# Patient Record
Sex: Male | Born: 1970
Health system: Southern US, Community
[De-identification: ages and names within clinical notes are randomized; demographics above are authoritative.]

## PROBLEM LIST (undated history)

## (undated) DIAGNOSIS — Z87442 Personal history of urinary calculi: Secondary | ICD-10-CM

## (undated) DIAGNOSIS — R251 Tremor, unspecified: Secondary | ICD-10-CM

## (undated) DIAGNOSIS — F419 Anxiety disorder, unspecified: Secondary | ICD-10-CM

## (undated) DIAGNOSIS — N200 Calculus of kidney: Secondary | ICD-10-CM

## (undated) DIAGNOSIS — I10 Essential (primary) hypertension: Secondary | ICD-10-CM

## (undated) DIAGNOSIS — M109 Gout, unspecified: Secondary | ICD-10-CM

## (undated) DIAGNOSIS — M1990 Unspecified osteoarthritis, unspecified site: Secondary | ICD-10-CM

## (undated) DIAGNOSIS — E669 Obesity, unspecified: Secondary | ICD-10-CM

## (undated) DIAGNOSIS — G43909 Migraine, unspecified, not intractable, without status migrainosus: Secondary | ICD-10-CM

## (undated) DIAGNOSIS — F32A Depression, unspecified: Secondary | ICD-10-CM

## (undated) DIAGNOSIS — Z9989 Dependence on other enabling machines and devices: Secondary | ICD-10-CM

## (undated) DIAGNOSIS — G629 Polyneuropathy, unspecified: Secondary | ICD-10-CM

## (undated) DIAGNOSIS — G4733 Obstructive sleep apnea (adult) (pediatric): Secondary | ICD-10-CM

## (undated) HISTORY — PX: OTHER SURGICAL HISTORY: SHX169

## (undated) HISTORY — DX: Gout, unspecified: M10.9

## (undated) HISTORY — DX: Anxiety disorder, unspecified: F41.9

## (undated) HISTORY — DX: Obstructive sleep apnea (adult) (pediatric): G47.33

## (undated) HISTORY — PX: TOOTH EXTRACTION: SUR596

## (undated) HISTORY — DX: Obstructive sleep apnea (adult) (pediatric): Z99.89

---

## 2000-04-18 ENCOUNTER — Encounter: Payer: Self-pay | Admitting: Emergency Medicine

## 2000-04-18 ENCOUNTER — Emergency Department (HOSPITAL_COMMUNITY): Admission: EM | Admit: 2000-04-18 | Discharge: 2000-04-18 | Payer: Self-pay

## 2000-12-24 ENCOUNTER — Emergency Department (HOSPITAL_COMMUNITY): Admission: EM | Admit: 2000-12-24 | Discharge: 2000-12-24 | Payer: Self-pay | Admitting: *Deleted

## 2000-12-24 ENCOUNTER — Encounter: Payer: Self-pay | Admitting: Emergency Medicine

## 2001-03-06 ENCOUNTER — Encounter: Payer: Self-pay | Admitting: Emergency Medicine

## 2001-03-06 ENCOUNTER — Emergency Department (HOSPITAL_COMMUNITY): Admission: EM | Admit: 2001-03-06 | Discharge: 2001-03-06 | Payer: Self-pay | Admitting: *Deleted

## 2005-09-06 ENCOUNTER — Encounter: Payer: Self-pay | Admitting: Family Medicine

## 2005-09-06 LAB — CONVERTED CEMR LAB
Cholesterol: 163 mg/dL
HDL: 29 mg/dL

## 2008-06-30 ENCOUNTER — Emergency Department (HOSPITAL_COMMUNITY): Admission: EM | Admit: 2008-06-30 | Discharge: 2008-06-30 | Payer: Self-pay | Admitting: Emergency Medicine

## 2008-07-21 ENCOUNTER — Ambulatory Visit: Payer: Self-pay | Admitting: Family Medicine

## 2008-08-03 ENCOUNTER — Ambulatory Visit: Payer: Self-pay | Admitting: Family Medicine

## 2008-08-03 ENCOUNTER — Encounter: Payer: Self-pay | Admitting: Family Medicine

## 2008-08-03 DIAGNOSIS — M109 Gout, unspecified: Secondary | ICD-10-CM | POA: Insufficient documentation

## 2008-08-03 DIAGNOSIS — R609 Edema, unspecified: Secondary | ICD-10-CM | POA: Insufficient documentation

## 2008-08-03 DIAGNOSIS — I10 Essential (primary) hypertension: Secondary | ICD-10-CM | POA: Insufficient documentation

## 2008-08-03 LAB — CONVERTED CEMR LAB
ALT: 30 units/L (ref 0–53)
AST: 20 units/L (ref 0–37)
Albumin: 4.1 g/dL (ref 3.5–5.2)
Alkaline Phosphatase: 74 units/L (ref 39–117)
BUN: 13 mg/dL (ref 6–23)
CO2: 22 meq/L (ref 19–32)
Calcium: 9.8 mg/dL (ref 8.4–10.5)
Chloride: 103 meq/L (ref 96–112)
Creatinine, Ser: 0.93 mg/dL (ref 0.40–1.50)
Glucose, Bld: 87 mg/dL (ref 70–99)
HCT: 44.1 % (ref 39.0–52.0)
Hemoglobin: 14.4 g/dL (ref 13.0–17.0)
MCHC: 32.7 g/dL (ref 30.0–36.0)
MCV: 91.7 fL (ref 78.0–100.0)
Platelets: 337 10*3/uL (ref 150–400)
Potassium: 4.3 meq/L (ref 3.5–5.3)
RBC: 4.81 M/uL (ref 4.22–5.81)
RDW: 14.7 % (ref 11.5–15.5)
Sodium: 142 meq/L (ref 135–145)
Total Bilirubin: 0.3 mg/dL (ref 0.3–1.2)
Total Protein: 7.2 g/dL (ref 6.0–8.3)
Uric Acid, Serum: 7.9 mg/dL — ABNORMAL HIGH (ref 4.0–7.8)
WBC: 10.5 10*3/uL (ref 4.0–10.5)

## 2008-08-04 ENCOUNTER — Encounter: Payer: Self-pay | Admitting: Family Medicine

## 2008-10-19 ENCOUNTER — Ambulatory Visit: Payer: Self-pay | Admitting: Family Medicine

## 2008-10-19 DIAGNOSIS — F411 Generalized anxiety disorder: Secondary | ICD-10-CM | POA: Insufficient documentation

## 2009-05-24 ENCOUNTER — Ambulatory Visit: Payer: Self-pay | Admitting: Family Medicine

## 2009-09-23 ENCOUNTER — Ambulatory Visit: Payer: Self-pay | Admitting: Family Medicine

## 2009-09-23 LAB — CONVERTED CEMR LAB
Albumin: 4.5 g/dL (ref 3.5–5.2)
BUN: 20 mg/dL (ref 6–23)
Calcium: 9.6 mg/dL (ref 8.4–10.5)
Chloride: 102 meq/L (ref 96–112)
Cholesterol: 198 mg/dL (ref 0–200)
Glucose, Bld: 94 mg/dL (ref 70–99)
HDL: 35 mg/dL — ABNORMAL LOW (ref >39)
Potassium: 4.2 meq/L (ref 3.5–5.3)
Triglycerides: 231 mg/dL — ABNORMAL HIGH (ref <150)

## 2009-09-24 ENCOUNTER — Encounter: Payer: Self-pay | Admitting: Family Medicine

## 2010-01-19 ENCOUNTER — Encounter: Payer: Self-pay | Admitting: Family Medicine

## 2010-01-19 ENCOUNTER — Ambulatory Visit: Payer: Self-pay | Admitting: Family Medicine

## 2010-01-19 DIAGNOSIS — M25569 Pain in unspecified knee: Secondary | ICD-10-CM | POA: Insufficient documentation

## 2010-01-19 DIAGNOSIS — M722 Plantar fascial fibromatosis: Secondary | ICD-10-CM | POA: Insufficient documentation

## 2010-04-06 ENCOUNTER — Ambulatory Visit: Payer: Self-pay | Admitting: Family Medicine

## 2010-04-06 DIAGNOSIS — K4090 Unilateral inguinal hernia, without obstruction or gangrene, not specified as recurrent: Secondary | ICD-10-CM | POA: Insufficient documentation

## 2010-04-06 DIAGNOSIS — K439 Ventral hernia without obstruction or gangrene: Secondary | ICD-10-CM | POA: Insufficient documentation

## 2010-04-06 DIAGNOSIS — M549 Dorsalgia, unspecified: Secondary | ICD-10-CM | POA: Insufficient documentation

## 2010-04-18 ENCOUNTER — Emergency Department (HOSPITAL_COMMUNITY)
Admission: EM | Admit: 2010-04-18 | Discharge: 2010-04-18 | Payer: Self-pay | Source: Home / Self Care | Admitting: Emergency Medicine

## 2010-07-19 NOTE — Assessment & Plan Note (Signed)
Summary: leg edema/pain, plantar fasciitis, left knee pain   Vital Signs:  Patient profile:   40 year old male Weight:      375.8 pounds Temp:     98.1 degrees F oral Pulse rate:   88 / minute Pulse rhythm:   regular BP sitting:   113 / 77  (right arm) Cuff size:   large  Vitals Entered By: Loralee Pacas CMA (January 19, 2010 11:19 AM) CC: lower leg edema, foot pain, left knee pain Comments pt is c/o bilateral leg pain more so in left leg around his knee   Primary Care Provider:  Pearlean Brownie MD  CC:  lower leg edema, foot pain, and left knee pain.  History of Present Illness: Bilateral lower leg edema, Pt has some bilateral lower leg edema and some erythema on the left leg. he says this has been present for quite a while and that he keeps his legs propped up at night to keep the swelling down. He says that he has been having some sharp pain in his calves bilaterally at night. He gets it to improve by stretching the legs and says that the pain goes away. Pt has not had any shortness of breath that is out of his usual. No history of clots but is concerned about blood clots.   Foot pain: Pt is having bilateral foot pain. At first he described it as ankle pain but on further questioning it is the bottom of his feet. He is concerned about "heal spurs" because his mom had some. He says the pain is worse at the end of the day but is also sometimes present when he first puts his feet down in the morning. Hhe has tried Aleve, Iburpofen, elevation and ankle braces to get his feet to feel better.   Left knee pain: Pt developed some left knee pain this morning. It is onthe medial aspect of the knee. He does not remember any injury but says that he does bend down on his knees when dressing his little son in the morning. He took aleve this morning   Current Medications (verified): 1)  Lisinopril-Hydrochlorothiazide 20-25 Mg Tabs (Lisinopril-Hydrochlorothiazide) .... Take One Table By Mouth  Once Daily 2)  Allopurinol 300 Mg Tabs (Allopurinol) .... Take 1 Tablet By Mouth Once Daily 3)  Fluoxetine Hcl 40 Mg Caps (Fluoxetine Hcl) .Marland Kitchen.. 1 Daily 4)  Aspirin Adult Low Strength 81 Mg Tbec (Aspirin) .... Take 1 Tablet By Mouth Daily 5)  Ester-C 1000-50 Mg Tabs (Bioflavonoid Products) .Marland Kitchen.. 1 Tablet By Mouth Daily 6)  Calcium 600 1500 Mg Tabs (Calcium Carbonate) .... Take 1,200 Mg By Mouth Daily 7)  Vitamin D 1000 Unit  Tabs (Cholecalciferol) .... Take One Tablet By Mouth Daily 8)  Multivitamins   Tabs (Multiple Vitamin) .... Take One Tablet By Mouth Daily 9)  Zinc 50 Mg Tabs (Zinc) .... Take 2 Tablets By Mouth Daily 10)  Prilosec Otc 20 Mg Tbec (Omeprazole Magnesium) .... As Needed 11)  Claritin 10 Mg Tabs (Loratadine) .... As Needed 12)  Furosemide 20 Mg Tabs (Furosemide) .... Take 1 Pill Every Morning To Get Rid of Some Fluid On Your Legs  Allergies (verified): 1)  ! Sulfa  Review of Systems        vitals reviewed and pertinent negatives and positives seen in HPI   Physical Exam  General:  Obese, in no acute distress; alert,appropriate and cooperative throughout examination Head:  Normocephalic and atraumatic without obvious abnormalities. No apparent alopecia  or balding. Msk:  tenderness over MCL on left knee, no joint swelling noted but pt is obese so difficult to assess. Pt does not appear to have any effusion. no warmth.    Impression & Recommendations:  Problem # 1:  LEG EDEMA (ICD-782.3) Assessment New Pt has lower leg edema that appears to be a chronic issue. He has some hemosiderin deposits developing in his lower legs. He is quite obese and would benifit from weight loss. Will try a low dose Lasix to treat his edema.   His updated medication list for this problem includes:    Lisinopril-hydrochlorothiazide 20-25 Mg Tabs (Lisinopril-hydrochlorothiazide) .Marland Kitchen... Take one table by mouth once daily    Furosemide 20 Mg Tabs (Furosemide) .Marland Kitchen... Take 1 pill every morning to  get rid of some fluid on your legs  Orders: [facility]imer- [facility] ([address]40-98119) [facility]- Est  Level 4 ([address]782)  Problem # 2:  PLANTAR FASCIITIS, BILATERAL (ICD-728.71) Assessment: New Pt appears to be having some early signs of plantar fasciitis. He says it is worse in the evenings and first thing in the morning. He says it is also pain in his heels. Advised getting heel cups, stretching and rolling his feet over waterbottle with ice in it. May end up needing orthotics in the future. Advised to wear best supportive shoes.   Orders: [facility]- Est  Level 4 (95621)  Problem # 3:  KNEE PAIN, LEFT, ACUTE (ICD-719.46) Assessment: New Pt has had 1 day of left knee pain after bending down to dress his son. Could be some meniscal strain/tear, could be medial collateral ligament strain. Tender over medial aspect of knee joint. Advised ice, rest, Aleve.  His updated medication list for this problem includes:    Aspirin Adult Low Strength 81 Mg Tbec (Aspirin) .Marland Kitchen... Take 1 tablet by mouth daily  Orders: [facility]- Est  Level 4 ([address]65)  Complete Medication List: 1)  Lisinopril-hydrochlorothiazide 20-25 Mg Tabs (Lisinopril-hydrochlorothiazide) .... Take one table by mouth once daily 2)  Allopurinol 300 Mg Tabs (Allopurinol) .... Take 1 tablet by mouth once daily 3)  Fluoxetine Hcl 40 Mg Caps (Fluoxetine hcl) .Marland Kitchen.. 1 daily 4)  Aspirin Adult Low Strength 81 Mg Tbec (Aspirin) .... Take 1 tablet by mouth daily 5)  Ester-c 1000-50 Mg Tabs (Bioflavonoid products) .Marland Kitchen.. 1 tablet by mouth daily 6)  Calcium 600 1500 Mg Tabs (Calcium carbonate) .... Take 1,200 mg by mouth daily 7)  Vitamin D 1000 Unit Tabs (Cholecalciferol) .... Take one tablet by mouth daily 8)  Multivitamins Tabs (Multiple vitamin) .... Take one tablet by mouth daily 9)  Zinc 50 Mg Tabs (Zinc) .... Take 2 tablets by mouth daily 10)  Prilosec Otc 20 Mg Tbec (Omeprazole magnesium) .... As needed 11)  Claritin 10 Mg Tabs (Loratadine) .... As needed 12)  Furosemide 20  Mg Tabs (Furosemide) .... Take 1 pill every morning to get rid of some fluid on your legs  Patient Instructions: 1)  Keep doing Aleve every 8 hours for 2 weeks. Take with food.  2)  Ice the knee 3 times a day at least: 15 minutes at a time.  3)  Get heal cups for your best supportive shoes and wear as much as possible.  4)  If you have a plastic bottle, fill with water, freeze and roll on feet at night.  You can also do stretches for the feet.  5)  I will call you with d-dimer results.  6)  We started a fluid pill today. Take it daily  unless you start feeling lightheaded. Then take every other day. If still feeling lightheaded stop it and call our office.  Prescriptions: FUROSEMIDE 20 MG TABS (FUROSEMIDE) take 1 pill every morning to get rid of some fluid on your legs  #90 x 1   Entered and Authorized by:   Jamie Brookes MD   Signed by:   Jamie Brookes MD on 01/19/2010   Method used:   Electronically to        [facility] (retail)       [address]       [place], [place]  [address]       Ph: [phone] or [phone]       Fax: [phone]   RxID:   [id number]

## 2010-07-19 NOTE — Assessment & Plan Note (Signed)
Summary: patient think he may have ?hernia/bmc   Vital Signs:  Patient profile:   40 year old male Height:      68 inches Weight:      379 pounds BMI:     57.84 Temp:     98.2 degrees F oral Pulse rate:   103 / minute BP sitting:   175 / 97  (left arm) Cuff size:   large  Vitals Entered By: Garen Grams LPN (April 06, 2010 10:37 AM) CC: ?hernia in groin area Is Patient Diabetic? No Pain Assessment Patient in pain? yes     Location: lower back   Primary Care Provider:  Pearlean Brownie MD  CC:  ?hernia in groin area.  History of Present Illness: Back pain on and off for years.  Bilat aching.  No radiation or distal weakness or incontinence.  Not taking any medications.    Hernia? has noticed bulge in middle of abdomen with tighens.  Not painful no nausea or vomiting   Groin lump for last few months has also noticed mild pain and mass when coughs or bends in Left groin area.  No redness or discharge or nausea or vomiting or severe pain  Obesity has been off his diet and exercise.  He acknowledges that his hernias and pain are due to his weight  HYPERTENSION Disease Monitoring   Blood pressure range:  not checking     Chest pain: N     Dyspnea:n Medications   Compliance: daily   Lightheadedness: N     Edema:of lower legs some better with lasix   ROS - as above PMH - Medications reviewed and updated in medication list.  Smoking Status noted in VS form    Habits & Providers  Alcohol-Tobacco-Diet     Tobacco Status: never  Current Medications (verified): 1)  Lisinopril-Hydrochlorothiazide 20-25 Mg Tabs (Lisinopril-Hydrochlorothiazide) .... Take One Table By Mouth Once Daily 2)  Allopurinol 300 Mg Tabs (Allopurinol) .... Take 1 Tablet By Mouth Once Daily 3)  Fluoxetine Hcl 40 Mg Caps (Fluoxetine Hcl) .Marland Kitchen.. 1 Daily 4)  Aspirin Adult Low Strength 81 Mg Tbec (Aspirin) .... Take 1 Tablet By Mouth Daily 5)  Ester-C 1000-50 Mg Tabs (Bioflavonoid Products) .Marland Kitchen.. 1  Tablet By Mouth Daily 6)  Calcium 600 1500 Mg Tabs (Calcium Carbonate) .... Take 1,200 Mg By Mouth Daily 7)  Vitamin D 1000 Unit  Tabs (Cholecalciferol) .... Take One Tablet By Mouth Daily 8)  Multivitamins   Tabs (Multiple Vitamin) .... Take One Tablet By Mouth Daily 9)  Zinc 50 Mg Tabs (Zinc) .... Take 2 Tablets By Mouth Daily 10)  Prilosec Otc 20 Mg Tbec (Omeprazole Magnesium) .... As Needed 11)  Claritin 10 Mg Tabs (Loratadine) .... As Needed 12)  Furosemide 20 Mg Tabs (Furosemide) .... Take 1 Pill Every Morning To Get Rid of Some Fluid On Your Legs  Allergies: 1)  ! Sulfa  Physical Exam  General:  Well-developed,well-nourished,in no acute distress; alert,appropriate and cooperative throughout examination  obese  Abdomen:  midline soft mass with valsalva resolves with relaxation non tender  Genitalia:  large amout of redundent tissue.  Seems to be tender over inguinal ring area with questionable mass with valsalva on L compared to R.  No erythema or hard mass Msk:  mildly tender diffusely over low back no deformityor masses or focal pain  Neurologic:  able to walk on toes and heels and deep knee bend    Impression & Recommendations:  Problem # 1:  HYPERTENSION, BENIGN (ICD-401.1) elevated today - has been rushing and just took his medications  His updated medication list for this problem includes:    Lisinopril-hydrochlorothiazide 20-25 Mg Tabs (Lisinopril-hydrochlorothiazide) .Marland Kitchen... Take one table by mouth once daily    Furosemide 20 Mg Tabs (Furosemide) .Marland Kitchen... Take 1 pill every morning to get rid of some fluid on your legs  BP today: 175/97 Prior BP: 113/77 (01/19/2010)  Labs Reviewed: K+: 4.2 (09/23/2009) Creat: : 0.95 (09/23/2009)   Chol: 198 (09/23/2009)   HDL: 35 (09/23/2009)   LDL: 117 (09/23/2009)   TG: 231 (09/23/2009)  Problem # 2:  KNEE PAIN, LEFT, ACUTE (ICD-719.46) improved  His updated medication list for this problem includes:    Aspirin Adult Low Strength 81  Mg Tbec (Aspirin) .Marland Kitchen... Take 1 tablet by mouth daily  Problem # 3:  VENTRAL HERNIA (ICD-553.20)  discussed causes and red flags   Orders: [facility]- Est  Level 4 (99214)  Problem # 4:  INGUINAL HERNIA (ICD-550.90)  discussed causes and red flags   Orders: [facility]- Est  Level 4 (04540)  Problem # 5:  OBESITY (ICD-278.00)  majority of our discussion.  Related all his medical problems are connected with his weight.  He plans to work on intake   Orders: [facility]- Est  Level 4 (98119)  Problem # 6:  BACK PAIN (ICD-724.5) musculoskeletal no red flags.  Discussed analgesics over the counter and weight loss  His updated medication list for this problem includes:    Aspirin Adult Low Strength 81 Mg Tbec (Aspirin) .Marland Kitchen... Take 1 tablet by mouth daily  Complete Medication List: 1)  Lisinopril-hydrochlorothiazide 20-25 Mg Tabs (Lisinopril-hydrochlorothiazide) .... Take one table by mouth once daily 2)  Allopurinol 300 Mg Tabs (Allopurinol) .... Take 1 tablet by mouth once daily 3)  Fluoxetine Hcl 40 Mg Caps (Fluoxetine hcl) .Marland Kitchen.. 1 daily 4)  Aspirin Adult Low Strength 81 Mg Tbec (Aspirin) .... Take 1 tablet by mouth daily 5)  Ester-c 1000-50 Mg Tabs (Bioflavonoid products) .Marland Kitchen.. 1 tablet by mouth daily 6)  Calcium 600 1500 Mg Tabs (Calcium carbonate) .... Take 1,200 mg by mouth daily 7)  Vitamin D 1000 Unit Tabs (Cholecalciferol) .... Take one tablet by mouth daily 8)  Multivitamins Tabs (Multiple vitamin) .... Take one tablet by mouth daily 9)  Zinc 50 Mg Tabs (Zinc) .... Take 2 tablets by mouth daily 10)  Prilosec Otc 20 Mg Tbec (Omeprazole magnesium) .... As needed 11)  Claritin 10 Mg Tabs (Loratadine) .... As needed 12)  Furosemide 20 Mg Tabs (Furosemide) .... Take 1 pill every morning to get rid of some fluid on your legs  Other Orders: Flu Vaccine 40yrs + (14782) Admin 1st Vaccine (95621)  Patient Instructions: 1)  Come back in 3 months 2)  Aim is to lose 2 lbs a week. 3)  Weigh yourself once  a week 4)  Food intake is the key  5)  Eat low calorie foods 6)  Take the furosemide only when really needed not every day 7)  If the hernias become very tender or hard and do not go back in you need to call us immediately   Orders Added: 1)  Flu Vaccine 91yrs + [90658] 2)  Admin 1st Vaccine [90471] 3)  [facility]- Est  Level 4 [30865]   Immunizations Administered:  Influenza Vaccine # 1:    Vaccine Type: Fluvax 3+    Site: left deltoid    Mfr: GlaxoSmithKline    Dose: 0.5 ml  Route: IM    Given by: Jone Baseman CMA    Exp. Date: 12/14/2010    Lot #: [id number]    VIS given: 01/11/10 version given April 06, 2010.  Flu Vaccine Consent Questions:    Do you have a history of severe allergic reactions to this vaccine? no    Any prior history of allergic reactions to egg and/or gelatin? no    Do you have a sensitivity to the preservative Thimersol? no    Do you have a past history of Guillan-Barre Syndrome? no    Do you currently have an acute febrile illness? no    Have you ever had a severe reaction to latex? no    Vaccine information given and explained to patient? yes   Immunizations Administered:  Influenza Vaccine # 1:    Vaccine Type: Fluvax 3+    Site: left deltoid    Mfr: GlaxoSmithKline    Dose: 0.5 ml    Route: IM    Given by: Jone Baseman CMA    Exp. Date: 12/14/2010    Lot #: [id number]    VIS given: 01/11/10 version given April 06, 2010.

## 2010-07-19 NOTE — Assessment & Plan Note (Signed)
Summary: f/u,df   Vital Signs:  Patient profile:   40 year old male Weight:      360.5 pounds Temp:     97.9 degrees F oral Pulse rate:   76 / minute Pulse rhythm:   regular BP sitting:   111 / 74  (right arm) Cuff size:   large  Vitals Entered By: Loralee Pacas CMA (September 23, 2009 9:52 AM) Comments pt is concerned with bilateral swelling in both legs    History of Present Illness: Current Problems:  OBESITY (ICD-278.00) with recent death of mom gained a lot of weight. Is back workign on diet now  ANXIETY STATE NOS (ICD-300.00) Prozac is helping but not as much as before.  Would like to increase the dose.  Thinks recent death of mom has worsened his anxiety.  No suicidal ideation or severe depression.  Functioning well  HYPERTENSION, BENIGN (ICD-401.1) taking medications regularly.  No chest pain or lightheadedness or unusual shortness of breath.  GOUT (ICD-274.9) no flares for years. Takes allopurinol daily.  no rashes or abdominal pain  EDEMA- LOCALIZED (ICD-782.3) worsened with recent weight gain.  Better in AM worse after up.  Not using stockings.  No orthopnea or PND.  No change in baseline shortness of breath  ROS - as above PMH - Medications reviewed and updated in medication list.  Smoking Status noted in VS form      Current Medications (verified): 1)  Lisinopril-Hydrochlorothiazide 20-25 Mg Tabs (Lisinopril-Hydrochlorothiazide) .... Take One Table By Mouth Once Daily 2)  Allopurinol 300 Mg Tabs (Allopurinol) .... Take 1 Tablet By Mouth Once Daily 3)  Fluoxetine Hcl 40 Mg Caps (Fluoxetine Hcl) .Marland Kitchen.. 1 Daily 4)  Aspirin Adult Low Strength 81 Mg Tbec (Aspirin) .... Take 1 Tablet By Mouth Daily 5)  Ester-C 1000-50 Mg Tabs (Bioflavonoid Products) .Marland Kitchen.. 1 Tablet By Mouth Daily 6)  Calcium 600 1500 Mg Tabs (Calcium Carbonate) .... Take 1,200 Mg By Mouth Daily 7)  Vitamin D 1000 Unit  Tabs (Cholecalciferol) .... Take One Tablet By Mouth Daily 8)  Multivitamins   Tabs  (Multiple Vitamin) .... Take One Tablet By Mouth Daily 9)  Zinc 50 Mg Tabs (Zinc) .... Take 2 Tablets By Mouth Daily 10)  Prilosec Otc 20 Mg Tbec (Omeprazole Magnesium) .... As Needed 11)  Claritin 10 Mg Tabs (Loratadine) .... As Needed  Physical Exam  General:  Obese Well-developed,well-nourished,in no acute distress; alert,appropriate and cooperative throughout examination Lungs:  Normal respiratory effort, chest expands symmetrically. Lungs are clear to auscultation, no crackles or wheezes. Heart:  Normal rate and regular rhythm. S1 and S2 normal without gallop, murmur, click, rub or other extra sounds. Extremities:  1-2 + pit edema on top of significant lipidedema.   No skin breakdown   Impression & Recommendations:  Problem # 1:  HYPERTENSION, BENIGN (ICD-401.1) On receck under control.   His updated medication list for this problem includes:    Lisinopril-hydrochlorothiazide 20-25 Mg Tabs (Lisinopril-hydrochlorothiazide) .Marland Kitchen... Take one table by mouth once daily  Orders: Comp Met-[facility] [phone]) Lipid-[facility] ([address]40-[address]272) [facility]- Est  Level 4 (99214)  BP today: 132/99 Prior BP: 133/71 (05/24/2009)  Labs Reviewed: K+: 4.3 (08/03/2008) Creat: : 0.93 (08/03/2008)   Chol: 163 (09/06/2005)   HDL: 29 (09/06/2005)   LDL: 86 (09/06/2005)   TG: 242 (09/06/2005)  Problem # 2:  ANXIETY STATE NOS (ICD-300.00)  worsened slightly with recent death of his mom and continued stress with father with memory problems and special needs child.  Will increase  prozac dose His updated medication list for this problem includes:    Fluoxetine Hcl 40 Mg Caps (Fluoxetine hcl) .Marland Kitchen... 1 daily  Orders: [facility]- Est  Level 4 ([address])  Problem # 3:  EDEMA- LOCALIZED (ICD-782.3) Assessment: Deteriorated  due to weight gain.  Discussed why not use diuretics.  weight loss is the key.  He and wife voice understanding and will work on these issues His updated medication list for this problem includes:     Lisinopril-hydrochlorothiazide 20-25 Mg Tabs (Lisinopril-hydrochlorothiazide) .Marland Kitchen... Take one table by mouth once daily  Orders: [facility]- Est  Level 4 (60454)  Problem # 4:  GOUT (ICD-274.9) hopefully can come off allopurinol since has not had flares but will need to get weight down first  His updated medication list for this problem includes:    Allopurinol 300 Mg Tabs (Allopurinol) .Marland Kitchen... Take 1 tablet by mouth once daily  Orders: Uric Acid-[facility] ([address]11-91478)  Complete Medication List: 1)  Lisinopril-hydrochlorothiazide 20-25 Mg Tabs (Lisinopril-hydrochlorothiazide) .... Take one table by mouth once daily 2)  Allopurinol 300 Mg Tabs (Allopurinol) .... Take 1 tablet by mouth once daily 3)  Fluoxetine Hcl 40 Mg Caps (Fluoxetine hcl) .Marland Kitchen.. 1 daily 4)  Aspirin Adult Low Strength 81 Mg Tbec (Aspirin) .... Take 1 tablet by mouth daily 5)  Ester-c 1000-50 Mg Tabs (Bioflavonoid products) .Marland Kitchen.. 1 tablet by mouth daily 6)  Calcium 600 1500 Mg Tabs (Calcium carbonate) .... Take 1,200 mg by mouth daily 7)  Vitamin D 1000 Unit Tabs (Cholecalciferol) .... Take one tablet by mouth daily 8)  Multivitamins Tabs (Multiple vitamin) .... Take one tablet by mouth daily 9)  Zinc 50 Mg Tabs (Zinc) .... Take 2 tablets by mouth daily 10)  Prilosec Otc 20 Mg Tbec (Omeprazole magnesium) .... As needed 11)  Claritin 10 Mg Tabs (Loratadine) .... As needed  Other Orders: Tdap => 23yrs IM (29562) Admin 1st Vaccine (13086) Admin 1st Vaccine [facility]) [phone])  Patient Instructions: 1)  Please schedule a follow-up appointment in 3 months .  2)  I will call you if your lab is abnormal otherwise I will send you a letter within 2 weeks. 3)  Aim to lose about 3 lbs a week. Weigh yourself once a week Prescriptions: LISINOPRIL-HYDROCHLOROTHIAZIDE 20-25 MG TABS (LISINOPRIL-HYDROCHLOROTHIAZIDE) Take one table by mouth once daily  #90 x 3   Entered and Authorized by:   Pearlean Brownie MD   Signed by:   Pearlean Brownie MD on  09/23/2009   Method used:   Electronically to        [facility] (retail)       [address]       [place], [place]  [address]       Ph: [phone] or [phone]       Fax: [phone]   RxID:   [id number] FLUOXETINE HCL 40 MG CAPS (FLUOXETINE HCL) 1 daily  #90 x 3   Entered and Authorized by:   Pearlean Brownie MD   Signed by:   Pearlean Brownie MD on 09/23/2009   Method used:   Electronically to        [facility] (retail)       [address]       [place], [place]  [address]       Ph: [phone] or [phone]       Fax: [phone]   RxID:  [id number]    Prevention & Chronic Care Immunizations   Influenza vaccine: Not documented    Tetanus booster: 09/23/2009: Tdap    Pneumococcal vaccine: Not documented  Other Screening   Smoking status: never  (05/24/2009)  Lipids   Total Cholesterol: 163  (09/06/2005)   LDL: 86  (09/06/2005)   LDL Direct: Not documented   HDL: 29  (09/06/2005)   Triglycerides: 242  (09/06/2005)  Hypertension   Last Blood Pressure: 111 / 74  (09/23/2009)   Serum creatinine: 0.93  (08/03/2008)   Serum potassium 4.3  (08/03/2008) CMP ordered     Hypertension flowsheet reviewed?: Yes   Progress toward BP goal: At goal  Self-Management Support :   Personal Goals (by the next clinic visit) :      Personal blood pressure goal: 140/90  (05/24/2009)   Hypertension self-management support: Not documented    Hypertension self-management support not done because: Good outcomes  (05/24/2009)   Tetanus/Td Vaccine    Vaccine Type: Tdap    Site: right deltoid    Mfr: GlaxoSmithKline    Dose: 0.5 ml    Route: IM    Given by: Garen Grams LPN    Exp. Date: 09/10/2011    Lot #: [id number]    VIS given: 05/07/07 version given September 23, 2009.

## 2010-07-19 NOTE — Letter (Signed)
Summary: Generic Letter  [facility]  [address]   [place], [place] [address]   Phone: [phone]  Fax: [phone]    09/24/2009  Justin [name] [address] [place], [place]  [address]  Dear [name],  Your blood tests are good except for your cholesterol. Your HDL (good) cholesterol is too low and your LDL is too high as are your triglycerides.  All of these would improve if you really work on your weight loss and exercise.   Cholesterol               198 mg/dL                   5-784     ATP III Classification:           < 200        mg/dL        Desirable          200 - 239     mg/dL        Borderline High          >= 240        mg/dL        High         Triglyceride         [H]  231 mg/dL                   <696   HDL Cholesterol      [L]  35 mg/dL                    >29   Total Chol/HDL Ratio      5.7 Ratio  VLDL Cholesterol (Calc)                        [H]  46 mg/dL                    5-28  LDL Cholesterol (Calc)                        [H]  117 mg/dL                   4-13    Sincerely,   Pearlean Brownie MD  Appended Document: Generic Letter mailed.

## 2010-07-21 ENCOUNTER — Other Ambulatory Visit: Payer: Self-pay | Admitting: Family Medicine

## 2010-07-21 ENCOUNTER — Encounter: Payer: Self-pay | Admitting: Family Medicine

## 2010-07-21 ENCOUNTER — Ambulatory Visit (INDEPENDENT_AMBULATORY_CARE_PROVIDER_SITE_OTHER): Payer: Self-pay | Admitting: Family Medicine

## 2010-07-21 DIAGNOSIS — L089 Local infection of the skin and subcutaneous tissue, unspecified: Secondary | ICD-10-CM

## 2010-07-24 LAB — WOUND CULTURE: Gram Stain: NONE SEEN

## 2010-07-27 NOTE — Assessment & Plan Note (Signed)
Summary: RED/PURPLISH SPOT TO  RT GROIN AREA/SENSITIVE TO TOUCH/[facility]   Vital Signs:  Patient profile:   40 year old male Height:      68 inches Weight:      396.6 pounds BMI:     60.52 Temp:     98.0 degrees F oral Pulse rate:   101 / minute BP sitting:   111 / 74  (right arm) Cuff size:   large  Vitals Entered By: Garen Grams LPN (July 21, 2010 3:30 PM) CC: inflammed area on groin area   CC:  inflammed area on groin area.  Allergies: 1)  ! Sulfa   Complete Medication List: 1)  Lisinopril-hydrochlorothiazide 20-25 Mg Tabs (Lisinopril-hydrochlorothiazide) .... Take one table by mouth once daily 2)  Allopurinol 300 Mg Tabs (Allopurinol) .... Take 1 tablet by mouth once daily 3)  Fluoxetine Hcl 40 Mg Caps (Fluoxetine hcl) .Marland Kitchen.. 1 daily 4)  Aspirin Adult Low Strength 81 Mg Tbec (Aspirin) .... Take 1 tablet by mouth daily 5)  Ester-c 1000-50 Mg Tabs (Bioflavonoid products) .Marland Kitchen.. 1 tablet by mouth daily 6)  Calcium 600 1500 Mg Tabs (Calcium carbonate) .... Take 1,200 mg by mouth daily 7)  Vitamin D 1000 Unit Tabs (Cholecalciferol) .... Take one tablet by mouth daily 8)  Multivitamins Tabs (Multiple vitamin) .... Take one tablet by mouth daily 9)  Zinc 50 Mg Tabs (Zinc) .... Take 2 tablets by mouth daily 10)  Prilosec Otc 20 Mg Tbec (Omeprazole magnesium) .... As needed 11)  Claritin 10 Mg Tabs (Loratadine) .... As needed 12)  Furosemide 20 Mg Tabs (Furosemide) .... Take 1 pill every morning to get rid of some fluid on your legs 13)  Doxycycline Hyclate 100 Mg Caps (Doxycycline hyclate) .... One tab by mouth two times a day for next 7 days.  Other Orders: Miscellaneous Lab Charge-[facility] [id number])  Patient Instructions: 1)  Mr. Justin [name], 2)  Thank you for coming in today. 3)  You have an infection in  your skin causing the redness and swelling. The technical term is cellulitus. 4)  I am sending a prescription for Doxycylline to your pharmacy take twice daily for the next 7 days.  5)   You may apply a warm compress for symptomatic relief. 6)  Please schedule a f/u with Dr. Deirdre Priest, Dr. Sheffield Slider or me for next week to assess your response to treatment.  7)  Call the clinic if: pain increases, the redness and swelling seems to be increasing despite treatment, you develop a fever.  8)  -Dr. Armen Pickup  Prescriptions: DOXYCYCLINE HYCLATE 100 MG CAPS (DOXYCYCLINE HYCLATE) one tab by mouth two times a day for next 7 days.  #14 x 0   Entered and Authorized by:   Dessa Phi MD   Signed by:   Dessa Phi MD on 07/21/2010   Method used:   Electronically to        [id number] (retail)       [address]       [place], [place]  [address]       Ph: [phone]       Fax: [phone]   RxID:   [id number]    Orders Added: 1)  Miscellaneous Lab Charge-[facility] [[id number]]  Appended Document: RED/PURPLISH SPOT TO  RT GROIN AREA/SENSITIVE TO TOUCH/[facility]     Primary Provider:  Pearlean Brownie MD  CC:  inflamed are on R side of groin x5 days.  History of Present Illness: Pt reports redness and swelling on his  R groin since last week. He describes an area of spreading redness with a purple center. The area is warm to touch. He denies trauma to the area, but admits to picking at the center purple area. He denies history of STIs. He denies history of similar problems. He does admit to ventral hernia and L inguinal hernia, but states that this feels different from that. He denies fevers, chills.      Allergies: 1)  ! Sulfa  Review of Systems       As per HPI  Physical Exam  General:  Obese alert, well-developed, well-nourished, and well-hydrated.   Abdomen:  Well-demarcated, 5x5 cm area of inflammation on R pannus with center indurated area that is hyperemic (purple). Center area also has peau d'orange changes. Non-fluctuant. No drainage in area of inflammation. 2 areas of folliculits with non-draining pus medial to the inflammation.-areas lanced and pus obtained for Gm  stain and Cx.   Skin folds and groin w/o inflammation.  Midly TTP.    Impression & Recommendations:  Problem # 1:  UNSPEC LOCAL INFECTION SKIN&SUBCUTANEOUS TISSUE (ICD-686.9) Cellulitus. Most likely pathogen is MRSA. Pt has history of folliculitus and risk factor of obesity. Non-diabetic. Plan: Wound culture and gram stain.  Doxy 100 two times a day x 7 days. f/u in 1 week to assess response to treatment. Center enduarated area may need to be drained.  Red flags reviewed.  Orders: Culture, Wound -[facility] ([id number]) Gram Stain-[facility] ([id number]) [facility]- Est Level  3 ([id number])  Complete Medication List: 1)  Lisinopril-hydrochlorothiazide 20-25 Mg Tabs (Lisinopril-hydrochlorothiazide) .... Take one table by mouth once daily 2)  Allopurinol 300 Mg Tabs (Allopurinol) .... Take 1 tablet by mouth once daily 3)  Fluoxetine Hcl 40 Mg Caps (Fluoxetine hcl) .Marland Kitchen.. 1 daily 4)  Aspirin Adult Low Strength 81 Mg Tbec (Aspirin) .... Take 1 tablet by mouth daily 5)  Ester-c 1000-50 Mg Tabs (Bioflavonoid products) .Marland Kitchen.. 1 tablet by mouth daily 6)  Calcium 600 1500 Mg Tabs (Calcium carbonate) .... Take 1,200 mg by mouth daily 7)  Vitamin D 1000 Unit Tabs (Cholecalciferol) .... Take one tablet by mouth daily 8)  Multivitamins Tabs (Multiple vitamin) .... Take one tablet by mouth daily 9)  Zinc 50 Mg Tabs (Zinc) .... Take 2 tablets by mouth daily 10)  Prilosec Otc 20 Mg Tbec (Omeprazole magnesium) .... As needed 11)  Claritin 10 Mg Tabs (Loratadine) .... As needed 12)  Furosemide 20 Mg Tabs (Furosemide) .... Take 1 pill every morning to get rid of some fluid on your legs 13)  Doxycycline Hyclate 100 Mg Caps (Doxycycline hyclate) .... One tab by mouth two times a day for next 7 days.   Orders Added: 1)  Culture, Wound -[facility] [[id number]] 2)  Gram Stain-[facility] [[id number]] 3)  [facility]- Est Level  3 [[id number]]

## 2010-08-08 ENCOUNTER — Encounter: Payer: Self-pay | Admitting: Family Medicine

## 2010-08-08 ENCOUNTER — Ambulatory Visit (INDEPENDENT_AMBULATORY_CARE_PROVIDER_SITE_OTHER): Payer: Self-pay | Admitting: Family Medicine

## 2010-08-08 VITALS — BP 155/91 | HR 99 | Temp 98.0°F | Ht 68.0 in | Wt 396.3 lb

## 2010-08-08 DIAGNOSIS — R0681 Apnea, not elsewhere classified: Secondary | ICD-10-CM

## 2010-08-08 DIAGNOSIS — E669 Obesity, unspecified: Secondary | ICD-10-CM

## 2010-08-08 DIAGNOSIS — G4730 Sleep apnea, unspecified: Secondary | ICD-10-CM | POA: Insufficient documentation

## 2010-08-08 MED ORDER — FUROSEMIDE 20 MG PO TABS: 20.0000 mg | ORAL_TABLET | Freq: Every day | ORAL | Status: DC | PRN

## 2010-08-08 NOTE — Patient Instructions (Signed)
Come back in 2 months to check your weight and check blood test Will refer you for a sleep study Continue with the weight loss that is the key to all your health problems

## 2010-08-08 NOTE — Progress Notes (Signed)
  Subjective:    Patient ID: Justin [name], male    DOB: December 25, 1970, 40 y.o.   MRN: [id number]  HPI  Groin infection - the small abscess burst a few days after being seen.  He finished all his antibiotics.  No longer tender just a small mass that is shrinking.  No new lesions.  No fever  Apnea He has trouble sleeping on his back and often sleeps sitting.  His wife reports that he snores and will sometimes stop breathing for up to 30 seconds.  He often feels tired and sometimes falls asleep but never while driving.    He does not have daytime shortness of breath or any chest pain  Obesity He feels by his scale at home he is losing weight.   Trying to eat more salads and has nearly stopped drinking sodas and sweet tea He feels that losing wait would improve his stamina and he is also concerned about his strong FHx for diabetes    Review of Systems see HPI.  Smoking status noted     Objective:   Physical Exam    Groin pannus on left side small area of scab with induration no redness or fluctuance  Neck:  No deformities, thyromegaly, masses, or tenderness noted.   Full range of motion with pain.  Lungs:  Normal respiratory effort, chest expands symmetrically. Lungs are clear to auscultation, no crackles or wheezes. Heart:  Normal rate and regular rhythm. S1 and S2 normal without gallop, murmur, click, rub or other extra sounds     Assessment & Plan:   Groin infection - resolved.  Discussed recurrence and weight loss would help

## 2010-08-08 NOTE — Progress Notes (Signed)
Called in the Rx..

## 2010-08-08 NOTE — Assessment & Plan Note (Signed)
Discussed the importance of weight control and his weight effect on his healthy.  Discussed dietary approaches

## 2010-08-08 NOTE — Assessment & Plan Note (Signed)
Very likely to have sleep apnea given his history and habitus.  No signs or symptoms of cardiopulmonary disease.  Will refer for sleep study.

## 2010-09-15 ENCOUNTER — Encounter (HOSPITAL_BASED_OUTPATIENT_CLINIC_OR_DEPARTMENT_OTHER): Payer: Self-pay

## 2010-09-29 ENCOUNTER — Ambulatory Visit (HOSPITAL_BASED_OUTPATIENT_CLINIC_OR_DEPARTMENT_OTHER): Payer: Self-pay | Attending: Family Medicine

## 2010-09-29 DIAGNOSIS — G4733 Obstructive sleep apnea (adult) (pediatric): Secondary | ICD-10-CM | POA: Insufficient documentation

## 2010-10-06 ENCOUNTER — Telehealth: Payer: Self-pay | Admitting: Family Medicine

## 2010-10-06 NOTE — Telephone Encounter (Signed)
Wants to know results of sleep study

## 2010-10-07 NOTE — Telephone Encounter (Signed)
I called and they will be faxed over.

## 2010-10-07 NOTE — Telephone Encounter (Signed)
I have not seen the results.  He should contact where it was done and ask them to send me a copy thnaks LC

## 2010-10-08 DIAGNOSIS — G4733 Obstructive sleep apnea (adult) (pediatric): Secondary | ICD-10-CM

## 2010-10-08 NOTE — Procedures (Signed)
NAME:  Justin [name], ENDE NO.:  [id number]  MEDICAL RECORD NO.:  [id number]          PATIENT TYPE:  OUT  LOCATION:  SLEEP CENTER                 FACILITY:  [facility]  PHYSICIAN:  Lexxie Winberg D. Maple Hudson, MD, FCCP, FACPDATE OF BIRTH:  01/17/73  DATE OF STUDY:  09/29/2010                           NOCTURNAL POLYSOMNOGRAM  REFERRING PHYSICIAN:  MARSHALL L CHAMBLISS  INDICATION FOR STUDY:  Hypersomnia with sleep apnea.  EPWORTH SLEEPINESS SCORE:  22/24, BMI 56.4, weight 382 pounds, height 69 inches, and neck 20 inches.  HOME MEDICATIONS:  Charted and reviewed.  SLEEP ARCHITECTURE:  Split study protocol.  During the diagnostic phase, total sleep time 152.5 minutes with sleep efficiency 73.1%.  Stage I was 12.6%, stage II 86.1%, stage III absent, REM 1.3% of total sleep time. Sleep latency 13 minutes, REM latency 121 minutes, awake after sleep onset 28 minutes, arousal index 43.6.  BEDTIME MEDICATION:  None.  RESPIRATORY DATA:  Split study protocol.  Apnea/hypopnea index (AHI) 81.8 per hour.  A total of 152 events were scored including 21 obstructive apneas and 131 hypopneas.  Events were not positional.  REM AHI 40.  CPAP was then titrated to 13 CWP, AHI zero per hour.  He wore a medium ResMed Mirage Quattro full-face mask with heated humidifier.  OXYGEN DATA:  Moderate snoring before CPAP with oxygen desaturation to a nadir of 79% on room air.  With CPAP titration, mean oxygen saturation held 91.1% on room air and snoring was prevented.  CARDIAC DATA:  Normal sinus rhythm.  MOVEMENT/PARASOMNIA:  Occasional limb jerks were noted with insignificant effect on sleep.  Bathroom x2.  IMPRESSION/RECOMMENDATIONS: 1. Severe obstructive sleep apnea/hypopnea syndrome, AHI 81.8 per hour     with nonpositional events.  Moderate snoring and oxygen     desaturation to a nadir of 79% on room air. 2. Successful CPAP titration to 13 CWP, AHI zero per hour.  He wore a     medium ResMed  Mirage Quattro full-face mask with heated humidifier.     Snoring was prevented and oxygenation normalized.     Venna Berberich D. Maple Hudson, MD, [facility], FACP Diplomate, Biomedical engineer of Sleep Medicine Electronically Signed    CDY/MEDQ  D:  10/08/2010 10:22:21  T:  10/08/2010 21:13:07  Job:  [id number]

## 2010-10-12 ENCOUNTER — Other Ambulatory Visit: Payer: Self-pay

## 2010-10-12 ENCOUNTER — Other Ambulatory Visit: Payer: Self-pay | Admitting: Family Medicine

## 2010-10-12 DIAGNOSIS — R0681 Apnea, not elsewhere classified: Secondary | ICD-10-CM

## 2010-10-12 DIAGNOSIS — I10 Essential (primary) hypertension: Secondary | ICD-10-CM

## 2010-10-12 LAB — COMPREHENSIVE METABOLIC PANEL
ALT: 25 U/L (ref 0–53)
AST: 17 U/L (ref 0–37)
Albumin: 4.2 g/dL (ref 3.5–5.2)
Calcium: 9.6 mg/dL (ref 8.4–10.5)
Chloride: 100 mEq/L (ref 96–112)
Creat: 0.92 mg/dL (ref 0.40–1.50)
Potassium: 4.2 mEq/L (ref 3.5–5.3)
Sodium: 138 mEq/L (ref 135–145)

## 2010-10-12 NOTE — Progress Notes (Signed)
cmp done today [facility] White 

## 2010-10-13 ENCOUNTER — Encounter: Payer: Self-pay | Admitting: Family Medicine

## 2010-11-12 ENCOUNTER — Inpatient Hospital Stay (INDEPENDENT_AMBULATORY_CARE_PROVIDER_SITE_OTHER)
Admission: RE | Admit: 2010-11-12 | Discharge: 2010-11-12 | Disposition: A | Discharge status: Home / Self Care | Payer: Self-pay | Source: Ambulatory Visit | Attending: Emergency Medicine | Admitting: Emergency Medicine

## 2010-11-12 DIAGNOSIS — L519 Erythema multiforme, unspecified: Secondary | ICD-10-CM

## 2010-11-12 DIAGNOSIS — J3489 Other specified disorders of nose and nasal sinuses: Secondary | ICD-10-CM

## 2010-12-12 ENCOUNTER — Other Ambulatory Visit: Payer: Self-pay | Admitting: Family Medicine

## 2010-12-12 MED ORDER — FLUOXETINE HCL 40 MG PO CAPS: 40.0000 mg | ORAL_CAPSULE | Freq: Every day | ORAL | Status: DC

## 2010-12-22 ENCOUNTER — Other Ambulatory Visit: Payer: Self-pay | Admitting: Family Medicine

## 2010-12-22 MED ORDER — ALLOPURINOL 300 MG PO TABS: 300.0000 mg | ORAL_TABLET | Freq: Every day | ORAL | Status: DC

## 2011-01-06 ENCOUNTER — Other Ambulatory Visit: Payer: Self-pay | Admitting: Family Medicine

## 2011-01-06 MED ORDER — LISINOPRIL-HYDROCHLOROTHIAZIDE 20-25 MG PO TABS: 1.0000 | ORAL_TABLET | Freq: Every day | ORAL | Status: DC

## 2011-02-22 ENCOUNTER — Ambulatory Visit (INDEPENDENT_AMBULATORY_CARE_PROVIDER_SITE_OTHER): Payer: Self-pay | Admitting: Family Medicine

## 2011-02-22 ENCOUNTER — Encounter: Payer: Self-pay | Admitting: Family Medicine

## 2011-02-22 ENCOUNTER — Ambulatory Visit: Payer: Self-pay | Admitting: Family Medicine

## 2011-02-22 VITALS — BP 116/81 | HR 94 | Temp 97.8°F | Wt 388.0 lb

## 2011-02-22 DIAGNOSIS — M7660 Achilles tendinitis, unspecified leg: Secondary | ICD-10-CM | POA: Insufficient documentation

## 2011-02-22 MED ORDER — NITROGLYCERIN 0.2 MG/HR TD PT24: MEDICATED_PATCH | TRANSDERMAL | Status: DC

## 2011-02-22 NOTE — Assessment & Plan Note (Signed)
Patient has a fairly significant Achilles tendinitis. Unable to do an ultrasound at this time but did not feel that it will change management. We will start with ibuprofen 3 times a day for the next 5 days as well as ice 20 minutes 3 times a day. Patient given exercises and stretches to do a daily basis to patient getting heel lifts to help decrease the tension on the tendon as well. Patient is also going to apply a quarter of a nitroglycerin patch to the Achilles tendon daily. Patient to followup in 3 weeks

## 2011-02-22 NOTE — Progress Notes (Signed)
  Subjective:    Patient ID: Justin [name], male    DOB: 13-Aug-1970, 40 y.o.   MRN: [id number]  HPI 40 year old male coming in with left heel pain. Patient denies any type of injury to the area patient states that the pain has gradually onset and has increased in severity. Patient states it is worse with walking but does have pain at night that has woken him up as well. 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 Patient states that he feels he has a heel spur and is wondering if he can get it removed. Denies any type of fever any rash in the area or any open skin lesion. Patient still able to walk but does have significant pain patient has attempted to change shoes but states some shoes actually rubbed it and make it worse.   Review of Systems Denies fever chills numbness in the feet or loss in sensation or edema.   Past medical history, social, surgical and family history all reviewed.   Objective:   Physical Exam General: No apparent distress morbidly obese white male Ankle:Left  Some mild edema at the insertion of the achilles. Patient does have extra calcified deposits on the posterior aspect of the calcaneus. Range of motion is full in all directions. Strength is 5/5 in all directions. Stable lateral and medial ligaments; squeeze test and kleiger test unremarkable; Talar dome nontender; No pain at base of 5th MT; No tenderness over cuboid; No tenderness over N spot or navicular prominence No tenderness on posterior aspects of lateral and medial malleolus No sign of peroneal tendon subluxations; Negative tarsal tunnel tinel's Able to walk 4 steps. Significant tenderness over the achilles from insertion to 4 cm above.  No gapping or lesions noted. Does feel enlarged compared to contralateral side     Assessment & Plan:  Achilles tendinitis Patient has a fairly significant Achilles tendinitis. Unable to  do an ultrasound at this time but did not feel that it will change management. We will start with ibuprofen 3 times a day for the next 5 days as well as ice 20 minutes 3 times a day. Patient given exercises and stretches to do a daily basis to patient getting heel lifts to help decrease the tension on the tendon as well. Patient is also going to apply a quarter of a nitroglycerin patch to the Achilles tendon daily. Patient to followup in 3 weeks

## 2011-02-22 NOTE — Patient Instructions (Signed)
Next in each you. I when she to wear the heel lifts in your shoes whenever possible.  I when she did try to ice the area for 20 minutes 3 times a day. For the pain takes 800 mg of ibuprofen 3 times a day for the next 5 days only stop it if it starts upset her stomach. Wear a quarter of the nitroglycerin patch on your Achilles tendon daily. I want you to come back in 3-4 weeks for reevaluation.

## 2011-03-06 ENCOUNTER — Other Ambulatory Visit: Payer: Self-pay | Admitting: Family Medicine

## 2011-03-06 MED ORDER — LISINOPRIL-HYDROCHLOROTHIAZIDE 20-25 MG PO TABS: 1.0000 | ORAL_TABLET | Freq: Every day | ORAL | Status: DC

## 2011-03-06 NOTE — Telephone Encounter (Signed)
Mr. Liszewski is needing a refill on his Lisinopril HCTZ,  The last time a 30 day supply, but they have a card that will allow them a 90 day supply less expensive.  Needs [facility] on [address].

## 2011-03-14 ENCOUNTER — Encounter: Payer: Self-pay | Admitting: Family Medicine

## 2011-03-14 ENCOUNTER — Ambulatory Visit (INDEPENDENT_AMBULATORY_CARE_PROVIDER_SITE_OTHER): Payer: Self-pay | Admitting: Family Medicine

## 2011-03-14 ENCOUNTER — Ambulatory Visit (HOSPITAL_COMMUNITY)
Admission: RE | Admit: 2011-03-14 | Discharge: 2011-03-14 | Disposition: A | Discharge status: Home / Self Care | Payer: Self-pay | Source: Ambulatory Visit | Attending: Family Medicine | Admitting: Family Medicine

## 2011-03-14 VITALS — BP 116/76 | HR 103 | Temp 97.7°F | Wt 387.1 lb

## 2011-03-14 DIAGNOSIS — M7730 Calcaneal spur, unspecified foot: Secondary | ICD-10-CM | POA: Insufficient documentation

## 2011-03-14 DIAGNOSIS — M7660 Achilles tendinitis, unspecified leg: Secondary | ICD-10-CM

## 2011-03-14 DIAGNOSIS — M25579 Pain in unspecified ankle and joints of unspecified foot: Secondary | ICD-10-CM | POA: Insufficient documentation

## 2011-03-14 DIAGNOSIS — M7750 Other enthesopathy of unspecified foot: Secondary | ICD-10-CM | POA: Insufficient documentation

## 2011-03-14 MED ORDER — PREDNISONE 50 MG PO TABS: 50.0000 mg | ORAL_TABLET | Freq: Every day | ORAL | Status: AC

## 2011-03-14 MED ORDER — MELOXICAM 15 MG PO TABS: 15.0000 mg | ORAL_TABLET | Freq: Every day | ORAL | Status: DC

## 2011-03-14 NOTE — Assessment & Plan Note (Signed)
Patient has not seen any improvement at this time. With the amount of ibuprofen he is taking we will change him to meloxicam daily Increase nitroglycerin patch to a half patch daily and we'll do prednisone for the next 7 days We will get an x-ray to make sure there is no fracture seen or any type of bone spur that is causing rubbing on the Achilles tendon Patient will continue he lives we'll decrease the amount exercises he is doing We'll have patient come back in 2 weeks for reevaluation at that time we should consider doing an injection in the peri-calcaneal bursitis patient also should consider physical therapy and iontophoresis at this follow up

## 2011-03-14 NOTE — Progress Notes (Signed)
  Subjective:    Patient ID: Justin [name], male    DOB: 23-Nov-1970, 40 y.o.   MRN: [id number]  HPI 40 year old male coming back for followup on his Achilles tendinitis. Pain his left side he has been using the nitroglycerin patch almost consistently he also is continuing to use ibuprofen and heel lifts at this time patient states that there is no improvement but has not worsened either patient states that he does feel more rubbing on that left ankle especially with the exercises the pain is still red at the insertion of the Achilles. Patient has not lost any strength and has no numbness or tingling in the area.   Review of Systems No fevers chills nausea vomiting abdominal pain or diarrhea.    Objective:   Physical Exam Ankle:Left  Some mild edema at the insertion of the achilles. Hagman sign  Range of motion is full in all directions. Strength is 5/5 in all directions. Stable lateral and medial ligaments; squeeze test and kleiger test unremarkable; Talar dome nontender; No pain at base of 5th MT; No tenderness over cuboid; No tenderness over N spot or navicular prominence No tenderness on posterior aspects of lateral and medial malleolus No sign of peroneal tendon subluxations; Negative tarsal tunnel tinel's Able to walk 4 steps. Significant tenderness over the achilles from insertion to 4 cm above.  No gapping or lesions noted. Does feel enlarged compared to contralateral side but no change from previous exam Patient also does have +1 pitting edema of the lower extremities bilaterally     Assessment & Plan:

## 2011-03-14 NOTE — Patient Instructions (Signed)
Stop the ibuprofen and try this new medication called meloxicam.  Take 1 pill daily Take the prednisone for the next 7 days Try taking your lasix regularly for the next 5 days Try 1/2 patch of the nitroglycerin.  I want to see you again in 2 weeks.

## 2011-03-28 ENCOUNTER — Encounter: Payer: Self-pay | Admitting: Family Medicine

## 2011-03-28 ENCOUNTER — Ambulatory Visit (INDEPENDENT_AMBULATORY_CARE_PROVIDER_SITE_OTHER): Payer: Self-pay | Admitting: Family Medicine

## 2011-03-28 DIAGNOSIS — J069 Acute upper respiratory infection, unspecified: Secondary | ICD-10-CM

## 2011-03-28 DIAGNOSIS — I10 Essential (primary) hypertension: Secondary | ICD-10-CM

## 2011-03-28 DIAGNOSIS — M7660 Achilles tendinitis, unspecified leg: Secondary | ICD-10-CM

## 2011-03-28 MED ORDER — AZITHROMYCIN 500 MG PO TABS: 500.0000 mg | ORAL_TABLET | Freq: Every day | ORAL | Status: AC

## 2011-03-28 NOTE — Assessment & Plan Note (Signed)
Wt Readings from Last 3 Encounters:  03/28/11 387 lb 6.4 oz (175.723 kg)  03/14/11 387 lb 1.6 oz (175.587 kg)  02/22/11 388 lb (175.996 kg)   Temp Readings from Last 3 Encounters:  03/28/11 97.7 F (36.5 C) Oral  03/14/11 97.7 F (36.5 C) Oral  02/22/11 97.8 F (36.6 C) Oral   BP Readings from Last 3 Encounters:  03/28/11 129/77  03/14/11 116/76  02/22/11 116/81   Pulse Readings from Last 3 Encounters:  03/28/11 83  03/14/11 103  02/22/11 94   Well-controlled blood pressure no changes in management

## 2011-03-28 NOTE — Progress Notes (Signed)
  Subjective:    Patient ID: Justin [name], male    DOB: Jul 04, 1970, 40 y.o.   MRN: [id number]  HPI Patient is coming back for followup of his Achilles tendinitis. Patient states it is feeling much better after the 7 day dose of corticosteroids. Patient states that he has had no pain now for the last 5 days able to do all his activities of daily living without any apprehension. Patient is still wearing a heel lifts And is still taking the meloxicam Patient also complains of having some mild cough with green sputum production denies any type of fever chills but has had positive sick contacts with his son having pneumonia. Patient also recently had a tooth pain and needed an emergency root canal for possible infection. Patient was not put on any antibiotics and is concerned.  Review of Systems Denies fevers chills nausea vomiting abdominal pain constipation or diarrhea   past medical social and surgical history reviewed with no changes Objective:   Physical Exam Gen. in no apparent distress obese male Ankle:Left  No edema at insertion of Achilles. Hagman sign  Range of motion is full in all directions. Strength is 5/5 in all directions. Stable lateral and medial ligaments; squeeze test and kleiger test unremarkable; Talar dome nontender; No pain at base of 5th MT; No tenderness over cuboid; No tenderness over N spot or navicular prominence No tenderness on posterior aspects of lateral and medial malleolus No sign of peroneal tendon subluxations; Negative tarsal tunnel tinel's Able to walk 4 steps. Patient also does have +1 pitting edema of the lower extremities bilaterally Pulmonary: Patient does have some mild rhonchi of both left upper lobe otherwise unremarkable no wheezing Cardiovascular regular rate and rhythm no murmur HEENT: Patient does have some mild postnasal drip and mild erythema of the pharynx     Assessment & Plan:

## 2011-03-28 NOTE — Assessment & Plan Note (Signed)
Has resolved at this time continue to heal S. continue meloxicam as needed told patient to start doing some stretches would be a good idea

## 2011-03-28 NOTE — Assessment & Plan Note (Signed)
We'll treat with azithromycin 3 days with a history of a positive sick contacts as well as tooth pain. Monitor for any changes likely will be followed

## 2011-07-05 ENCOUNTER — Encounter: Payer: Self-pay | Admitting: Family Medicine

## 2011-07-05 ENCOUNTER — Ambulatory Visit (INDEPENDENT_AMBULATORY_CARE_PROVIDER_SITE_OTHER): Payer: Self-pay | Admitting: Family Medicine

## 2011-07-05 VITALS — BP 125/79 | HR 94 | Temp 97.6°F | Ht 68.0 in | Wt 397.0 lb

## 2011-07-05 DIAGNOSIS — R609 Edema, unspecified: Secondary | ICD-10-CM

## 2011-07-05 DIAGNOSIS — F411 Generalized anxiety disorder: Secondary | ICD-10-CM

## 2011-07-05 DIAGNOSIS — E669 Obesity, unspecified: Secondary | ICD-10-CM

## 2011-07-05 DIAGNOSIS — M109 Gout, unspecified: Secondary | ICD-10-CM

## 2011-07-05 DIAGNOSIS — I10 Essential (primary) hypertension: Secondary | ICD-10-CM

## 2011-07-05 LAB — COMPREHENSIVE METABOLIC PANEL WITH GFR
ALT: 26 U/L (ref 0–53)
AST: 19 U/L (ref 0–37)
Albumin: 4.4 g/dL (ref 3.5–5.2)
Alkaline Phosphatase: 67 U/L (ref 39–117)
BUN: 19 mg/dL (ref 6–23)
CO2: 23 meq/L (ref 19–32)
Calcium: 10 mg/dL (ref 8.4–10.5)
Chloride: 100 meq/L (ref 96–112)
Creat: 0.96 mg/dL (ref 0.50–1.35)
Glucose, Bld: 90 mg/dL (ref 70–99)
Potassium: 4.3 meq/L (ref 3.5–5.3)
Sodium: 138 meq/L (ref 135–145)
Total Bilirubin: 0.4 mg/dL (ref 0.3–1.2)
Total Protein: 7 g/dL (ref 6.0–8.3)

## 2011-07-05 MED ORDER — FUROSEMIDE 20 MG PO TABS: 20.0000 mg | ORAL_TABLET | Freq: Every day | ORAL | Status: DC | PRN

## 2011-07-05 NOTE — Assessment & Plan Note (Signed)
Good control.  Continue current medication and check labs

## 2011-07-05 NOTE — Assessment & Plan Note (Signed)
Good control.  Maybe able to wean allopurinol if can lose significant weight

## 2011-07-05 NOTE — Assessment & Plan Note (Signed)
Stable on Prozac.   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

## 2011-07-05 NOTE — Progress Notes (Signed)
  Subjective:    Patient ID: Justin [name], male    DOB: June 18, 1971, 41 y.o.   MRN: [id number]  HPI  HYPERTENSION Disease Monitoring Home BP Monitoring not doing Chest pain- no     Dyspnea-  no  Medications Compliance: taking as prescribed. Lightheadedness-  no  Edema-  Chronic lipidema goes down some in the AM but not much   ROS - See HPI  PMH Lab Review   Potassium  Date Value Range Status  10/12/2010 4.2  3.5-5.3 (mEq/L) Final     Sodium  Date Value Range Status  10/12/2010 138  135-145 (mEq/L) Final       Anxiety Stable on prozac.  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  Good appetitite and no depression  Gout No flares takes allopurinol regularly.  No rashes or infections     Review of Systems Patient reports no  vision/ hearing changes,anorexia, weight change, fever ,adenopathy, persistant / recurrent hoarseness, swallowing issues, chest pain, edema,persistant / recurrent cough, hemoptysis, dyspnea(rest, exertional, paroxysmal nocturnal), gastrointestinal  bleeding (melena, rectal bleeding), abdominal pain, excessive heart burn, GU symptoms(dysuria, hematuria, pyuria, voiding/incontinence  Issues) syncope, focal weakness, severe memory loss, concerning skin lesions, depression, anxiety, abnormal bruising/bleeding, major joint swelling.       Objective:   Physical Exam  Heart - Regular rate and rhythm.  No murmurs, gallops or rubs.    Lungs:  Normal respiratory effort, chest expands symmetrically. Lungs are clear to auscultation, no crackles or wheezes. Extremities:  No cyanosis, deformity noted with good range of motion of all major joints.  Barely pitting lipidema bilaterrally        Assessment & Plan:

## 2011-07-05 NOTE — Assessment & Plan Note (Signed)
Unchanged.  Discussed this is his major health issue.  He has started using [employer] app on his phone - both he and his wife.  Had a friend who lost 150 lb using this

## 2011-07-05 NOTE — Patient Instructions (Signed)
  Keep working in the weight loss that is the key to your health  I will call you if your tests are not good.  Otherwise I will send you a letter.  If you do not hear from me with in 2 weeks please call our office.     Use the lasix not every day   Call if any skin sore on your legs or if your blood pressure starts to rise

## 2011-07-05 NOTE — Assessment & Plan Note (Signed)
Due to weight no signs of CHF or systemic cause.  Discussed diuretics are not the answer and should be used only intermittentlly

## 2011-07-07 ENCOUNTER — Encounter: Payer: Self-pay | Admitting: Family Medicine

## 2011-12-18 ENCOUNTER — Encounter: Payer: Self-pay | Admitting: Family Medicine

## 2011-12-18 ENCOUNTER — Ambulatory Visit (INDEPENDENT_AMBULATORY_CARE_PROVIDER_SITE_OTHER): Payer: Self-pay | Admitting: Family Medicine

## 2011-12-18 VITALS — BP 130/82 | HR 105 | Temp 98.0°F | Ht 68.0 in | Wt 398.0 lb

## 2011-12-18 DIAGNOSIS — R079 Chest pain, unspecified: Secondary | ICD-10-CM

## 2011-12-18 DIAGNOSIS — M79673 Pain in unspecified foot: Secondary | ICD-10-CM

## 2011-12-18 DIAGNOSIS — M79609 Pain in unspecified limb: Secondary | ICD-10-CM

## 2011-12-18 DIAGNOSIS — M549 Dorsalgia, unspecified: Secondary | ICD-10-CM | POA: Insufficient documentation

## 2011-12-18 LAB — POCT URINALYSIS DIPSTICK
Blood, UA: NEGATIVE
Leukocytes, UA: NEGATIVE
Nitrite, UA: NEGATIVE
Protein, UA: 30
Urobilinogen, UA: 0.2
pH, UA: 5.5

## 2011-12-18 MED ORDER — SULINDAC 200 MG PO TABS: 200.0000 mg | ORAL_TABLET | Freq: Two times a day (BID) | ORAL | Status: DC

## 2011-12-18 NOTE — Patient Instructions (Addendum)
i will call you if the urine is abnormal otherwise will send a letter  Use the clinoril as needed can take with tylenol but not with ibuprofen or aspirin  Use ice on the heel and back if hurting after using them  Continue with the weight loss the stopping sodas is a great step  Come back in 3-6 mo to check on the blood pressure

## 2011-12-18 NOTE — Assessment & Plan Note (Signed)
Musculoskeletal  No indication of any cardiac cause.   Will monitor

## 2011-12-18 NOTE — Progress Notes (Signed)
  Subjective:    Patient ID: TARANCE BALAN, male    DOB: 16-Jul-1970, 41 y.o.   MRN: [id number]  HPI  Heel Pain R Having pain in poster heel - show picture of spur taken at [facility] office.  Tramadol and Ibuprofen not helping.  Trying to lose weight.  No redness or discharge  Chest Pain Intermittent sharp shooting pain lasts seconds.  Not associated with exertion.  Last had a few days ago.  No shortness of breath or nausea or vomiting or gi bleeding  L Flank Pain  Intermittent, like a kidney stone I had a long time ago.  Does not have now.  No nausea or vomiting no hematuria or dysuria.  No trauma  Review of Symptoms - see HPI  PMH - Smoking status noted.     Review of Systems     Objective:   Physical Exam  Alert no acute distress Heart - Regular rate and rhythm.  No murmurs, gallops or rubs.   HS are distant due to habitus Lungs:  Normal respiratory effort, chest expands symmetrically. Lungs are clear to auscultation, no crackles or wheezes. Chest - nontender to palpation Abdomen: soft and non-tender without masses, organomegaly or hernias noted.  No guarding or rebound  Obese Back - no focal tenderness or bruising or CVAT Foot - mildly tender R posterior upper heel.  No redness or deformtiy Able to get up on exam table without difficulty        Assessment & Plan:

## 2011-12-18 NOTE — Assessment & Plan Note (Addendum)
Seems to be musculoskeletal no signs of renal stone or nerve involvement

## 2011-12-18 NOTE — Assessment & Plan Note (Signed)
Musculoskeletal  - perhaps due to spur.  His weight greatly worsens any focal abnormality.  Will try a different nsaid.  Told him narcotics would not be a good treatment and weight loss is the ultimate goal

## 2012-01-04 ENCOUNTER — Emergency Department (HOSPITAL_COMMUNITY)
Admission: EM | Admit: 2012-01-04 | Discharge: 2012-01-04 | Disposition: A | Discharge status: Home / Self Care | Payer: Self-pay | Attending: Emergency Medicine | Admitting: Emergency Medicine

## 2012-01-04 ENCOUNTER — Other Ambulatory Visit: Payer: Self-pay

## 2012-01-04 ENCOUNTER — Encounter (HOSPITAL_COMMUNITY): Payer: Self-pay | Admitting: *Deleted

## 2012-01-04 DIAGNOSIS — Z87442 Personal history of urinary calculi: Secondary | ICD-10-CM | POA: Insufficient documentation

## 2012-01-04 DIAGNOSIS — M545 Low back pain, unspecified: Secondary | ICD-10-CM | POA: Insufficient documentation

## 2012-01-04 DIAGNOSIS — Z8249 Family history of ischemic heart disease and other diseases of the circulatory system: Secondary | ICD-10-CM | POA: Insufficient documentation

## 2012-01-04 DIAGNOSIS — M549 Dorsalgia, unspecified: Secondary | ICD-10-CM

## 2012-01-04 DIAGNOSIS — Z833 Family history of diabetes mellitus: Secondary | ICD-10-CM | POA: Insufficient documentation

## 2012-01-04 DIAGNOSIS — Z823 Family history of stroke: Secondary | ICD-10-CM | POA: Insufficient documentation

## 2012-01-04 MED ORDER — CYCLOBENZAPRINE HCL 10 MG PO TABS: 10.0000 mg | ORAL_TABLET | Freq: Two times a day (BID) | ORAL | Status: AC | PRN

## 2012-01-04 MED ORDER — KETOROLAC TROMETHAMINE 60 MG/2ML IM SOLN
60.0000 mg | Freq: Once | INTRAMUSCULAR | Status: AC
  Administered 2012-01-04: 60 mg via INTRAMUSCULAR
  Filled 2012-01-04: qty 2

## 2012-01-04 NOTE — ED Notes (Signed)
MD at bedside. 

## 2012-01-04 NOTE — ED Notes (Signed)
Patient states he has lower back pain that is worse since Saturday.  He has pain that radiates into the left leg.  He also reports he is having intermittent chest pain/tightness

## 2012-01-04 NOTE — ED Provider Notes (Signed)
History     CSN: [id number]  Arrival date & time 01/04/12  1252   First MD Initiated Contact with Patient 01/04/12 1625      Chief Complaint  Patient presents with  . Back Pain     HPI Patient states he has lower back pain that is worse since Saturday. He has pain that radiates into the left leg. He also reports he is having intermittent chest pain/tightness  Past Medical History  Diagnosis Date  . Nephrolithiasis     History reviewed. No pertinent past surgical history.  Family History  Problem Relation Age of Onset  . Coronary artery disease Mother   . Diabetes Mother   . Stroke Mother     History  Substance Use Topics  . Smoking status: Never Smoker   . Smokeless tobacco: Never Used  . Alcohol Use: No      Review of Systems  All other systems reviewed and are negative.    Allergies  Sulfonamide derivatives  Home Medications   Current Outpatient Rx  Name Route Sig Dispense Refill  . ALLOPURINOL 300 MG PO TABS Oral Take 300 mg by mouth daily.    . ASPIRIN 81 MG PO TABS Oral Take 81 mg by mouth daily.      . ESTER-C 1000-50 MG PO TABS Oral Take 1 tablet by mouth daily.      Marland Kitchen FLUOXETINE HCL 40 MG PO CAPS Oral Take 40 mg by mouth daily.    . FUROSEMIDE 20 MG PO TABS Oral Take 20 mg by mouth daily as needed. To get rid of some of the fluid on your legs    . LISINOPRIL-HYDROCHLOROTHIAZIDE 20-25 MG PO TABS Oral Take 1 tablet by mouth daily.    . SULINDAC 150 MG PO TABS Oral Take 150 mg by mouth daily as needed.    Marland Kitchen ZINC 50 MG PO TABS Oral Take 1 tablet by mouth daily.     . CYCLOBENZAPRINE HCL 10 MG PO TABS Oral Take 1 tablet (10 mg total) by mouth 2 (two) times daily as needed for muscle spasms. 20 tablet 0    BP 129/76  Pulse 86  Temp 98 F (36.7 C) (Oral)  Resp 18  SpO2 98%  Physical Exam  Nursing note and vitals reviewed. Constitutional: He is oriented to person, place, and time. He appears well-developed and well-nourished. No distress.    HENT:  Head: Normocephalic and atraumatic.  Eyes: Pupils are equal, round, and reactive to light.  Neck: Normal range of motion.  Cardiovascular: Normal rate and intact distal pulses.   Pulmonary/Chest: No respiratory distress.  Abdominal: Normal appearance. He exhibits no distension.  Musculoskeletal:       Arms: Neurological: He is alert and oriented to person, place, and time. No cranial nerve deficit.  Skin: Skin is warm and dry. No rash noted.  Psychiatric: He has a normal mood and affect. His behavior is normal.    ED Course  Procedures (including critical care time) Scheduled Meds:   . ketorolac  60 mg Intramuscular Once   Continuous Infusions:  PRN Meds:.  Labs Reviewed - No data to display No results found.   1. Back pain       MDM  After treatment in the ED the patient feels back to baseline and wants to go home.         Nelia Shi, MD 01/04/12 [place] Fruit

## 2012-02-18 ENCOUNTER — Other Ambulatory Visit: Payer: Self-pay | Admitting: Family Medicine

## 2012-05-05 ENCOUNTER — Other Ambulatory Visit: Payer: Self-pay | Admitting: Family Medicine

## 2012-05-22 ENCOUNTER — Other Ambulatory Visit: Payer: Self-pay | Admitting: Family Medicine

## 2012-05-28 ENCOUNTER — Emergency Department (HOSPITAL_COMMUNITY)
Admission: EM | Admit: 2012-05-28 | Discharge: 2012-05-28 | Disposition: A | Discharge status: Home / Self Care | Payer: 59 | Attending: Emergency Medicine | Admitting: Emergency Medicine

## 2012-05-28 ENCOUNTER — Emergency Department (HOSPITAL_COMMUNITY): Payer: Self-pay

## 2012-05-28 ENCOUNTER — Encounter (HOSPITAL_COMMUNITY): Payer: Self-pay | Admitting: Emergency Medicine

## 2012-05-28 DIAGNOSIS — R0789 Other chest pain: Secondary | ICD-10-CM | POA: Insufficient documentation

## 2012-05-28 DIAGNOSIS — N200 Calculus of kidney: Secondary | ICD-10-CM | POA: Insufficient documentation

## 2012-05-28 DIAGNOSIS — E669 Obesity, unspecified: Secondary | ICD-10-CM | POA: Insufficient documentation

## 2012-05-28 DIAGNOSIS — Z79899 Other long term (current) drug therapy: Secondary | ICD-10-CM | POA: Insufficient documentation

## 2012-05-28 DIAGNOSIS — I10 Essential (primary) hypertension: Secondary | ICD-10-CM | POA: Insufficient documentation

## 2012-05-28 DIAGNOSIS — R079 Chest pain, unspecified: Secondary | ICD-10-CM

## 2012-05-28 DIAGNOSIS — Z7982 Long term (current) use of aspirin: Secondary | ICD-10-CM | POA: Insufficient documentation

## 2012-05-28 HISTORY — DX: Calculus of kidney: N20.0

## 2012-05-28 HISTORY — DX: Obesity, unspecified: E66.9

## 2012-05-28 HISTORY — DX: Essential (primary) hypertension: I10

## 2012-05-28 LAB — BASIC METABOLIC PANEL
BUN: 17 mg/dL (ref 6–23)
Chloride: 94 mEq/L — ABNORMAL LOW (ref 96–112)
Creatinine, Ser: 0.97 mg/dL (ref 0.50–1.35)
GFR calc Af Amer: >90 mL/min (ref >90)
GFR calc non Af Amer: >90 mL/min (ref >90)
Potassium: 3.2 mEq/L — ABNORMAL LOW (ref 3.5–5.1)

## 2012-05-28 LAB — CBC WITH DIFFERENTIAL/PLATELET
Basophils Absolute: 0.1 10*3/uL (ref 0.0–0.1)
Basophils Relative: 1 % (ref 0–1)
Eosinophils Absolute: 0.3 10*3/uL (ref 0.0–0.7)
Hemoglobin: 15.5 g/dL (ref 13.0–17.0)
MCH: 30.4 pg (ref 26.0–34.0)
MCHC: 34.2 g/dL (ref 30.0–36.0)
Monocytes Relative: 6 % (ref 3–12)
Neutro Abs: 6.4 10*3/uL (ref 1.7–7.7)
Neutrophils Relative %: 56 % (ref 43–77)
RDW: 14.6 % (ref 11.5–15.5)

## 2012-05-28 LAB — TROPONIN I: Troponin I: <0.3 ng/mL (ref <0.30)

## 2012-05-28 MED ORDER — NITROGLYCERIN 0.4 MG SL SUBL
0.4000 mg | SUBLINGUAL_TABLET | SUBLINGUAL | Status: AC | PRN
  Administered 2012-05-28 (×3): 0.4 mg via SUBLINGUAL
  Filled 2012-05-28 (×2): qty 25

## 2012-05-28 MED ORDER — POTASSIUM CHLORIDE CRYS ER 20 MEQ PO TBCR
40.0000 meq | EXTENDED_RELEASE_TABLET | Freq: Once | ORAL | Status: AC
  Administered 2012-05-28: 40 meq via ORAL
  Filled 2012-05-28: qty 2

## 2012-05-28 MED ORDER — MORPHINE SULFATE 4 MG/ML IJ SOLN
4.0000 mg | Freq: Once | INTRAMUSCULAR | Status: AC
  Administered 2012-05-28: 4 mg via INTRAVENOUS
  Filled 2012-05-28: qty 1

## 2012-05-28 MED ORDER — IOHEXOL 350 MG/ML SOLN
100.0000 mL | Freq: Once | INTRAVENOUS | Status: AC | PRN
  Administered 2012-05-28: 100 mL via INTRAVENOUS

## 2012-05-28 NOTE — ED Provider Notes (Signed)
History     CSN: [id number]  Arrival date & time 05/28/12  7829   First MD Initiated Contact with Patient 05/28/12 0930      No chief complaint on file.   (Consider location/radiation/quality/duration/timing/severity/associated sxs/prior treatment) HPI Comments: This is a 41 year old male, who presents emergency department with chief complaint of chest pain that started approximately 2 hours ago. Patient states that the pain radiates to his left chest and shoulder. 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 He reports that it is worsened with deep breathing. He denies headache, shortness of breath, nausea, vomiting, diarrhea, constipation, numbness or tingling of the extremities. Patient states that his pain is 7/10.  The history is provided by the patient. No language interpreter was used.    Past Medical History  Diagnosis Date  . Nephrolithiasis   . Obese   . Hypertension   . Kidney stones     No past surgical history on file.  Family History  Problem Relation Age of Onset  . Coronary artery disease Mother   . Diabetes Mother   . Stroke Mother     History  Substance Use Topics  . Smoking status: Never Smoker   . Smokeless tobacco: Never Used  . Alcohol Use: No      Review of Systems  All other systems reviewed and are negative.    Allergies  Sulfonamide derivatives  Home Medications   Current Outpatient Rx  Name  Route  Sig  Dispense  Refill  . ALLOPURINOL 300 MG PO TABS   Oral   Take 300 mg by mouth daily.         . ALLOPURINOL 300 MG PO TABS      TAKE ONE TABLET BY MOUTH ONE TIME DAILY   30 tablet   6   . ASPIRIN 81 MG PO TABS   Oral   Take 81 mg by mouth daily.           . ESTER-C 1000-50 MG PO TABS   Oral   Take 1 tablet by mouth daily.           Marland Kitchen FLUOXETINE HCL 40 MG PO CAPS   Oral   Take 40 mg by mouth daily.         Marland Kitchen FLUOXETINE HCL 40 MG PO CAPS      TAKE ONE CAPSULE BY MOUTH ONE TIME DAILY   30 capsule   6   . FUROSEMIDE 20 MG PO TABS   Oral   Take 20 mg by mouth daily as needed. To get rid of some of the fluid on your legs         . FUROSEMIDE 20 MG PO TABS      TAKE 1 TABLET EVERY MORNING AS NEEDED TO GET RID OF FLUID ON YOUR LEGS.   90 tablet   0   . LISINOPRIL-HYDROCHLOROTHIAZIDE 20-25 MG PO TABS   Oral   Take 1 tablet by mouth daily.         Marland Kitchen LISINOPRIL-HYDROCHLOROTHIAZIDE 20-25 MG PO TABS      TAKE ONE TABLET BY MOUTH IN THE MORNING   90 tablet   0   . SULINDAC 150 MG PO TABS   Oral   Take 150 mg by mouth daily as needed.         Marland Kitchen ZINC 50 MG PO TABS   Oral   Take 1 tablet by mouth daily.  BP 143/70  Pulse 90  Temp 98.4 F (36.9 C) (Oral)  Resp 22  SpO2 94%  Physical Exam  Nursing note and vitals reviewed. Constitutional: He is oriented to person, place, and time. He appears well-developed and well-nourished.       Morbidly obese  HENT:  Head: Normocephalic and atraumatic.  Eyes: Conjunctivae normal and EOM are normal. Pupils are equal, round, and reactive to light. Right eye exhibits no discharge. Left eye exhibits no discharge. No scleral icterus.  Neck: Normal range of motion. Neck supple. No JVD present.  Cardiovascular: Normal rate, regular rhythm, normal heart sounds and intact distal pulses.  Exam reveals no gallop and no friction rub.   No murmur heard. Pulmonary/Chest: Effort normal and breath sounds normal. No respiratory distress. He has no wheezes. He has no rales. He exhibits no tenderness.  Abdominal: Soft. Bowel sounds are normal. He exhibits no distension and no mass. There is no tenderness. There is no rebound and no guarding.  Musculoskeletal: Normal range of motion. He exhibits no edema and no tenderness.  Neurological: He is alert and oriented to person, place, and time. He has normal reflexes.       CN 3-12 intact  Skin: Skin is warm and dry.  Psychiatric: He has a normal mood and affect. His behavior  is normal. Judgment and thought content normal.    ED Course  Procedures (including critical care time)  Labs Reviewed - No data to display No results found. ED ECG REPORT  I personally interpreted this EKG   Date: 05/28/2012   Rate: 102  Rhythm: sinus tachycardia  QRS Axis: normal  Intervals: normal  ST/T Wave abnormalities: normal  Conduction Disutrbances:none  Narrative Interpretation:   Old EKG Reviewed: none available  Results for orders placed during the hospital encounter of 05/28/12  CBC WITH DIFFERENTIAL      Component Value Range   WBC 11.4 (*) 4.0 - 10.5 K/uL   RBC 5.10  4.22 - 5.81 MIL/uL   Hemoglobin 15.5  13.0 - 17.0 g/dL   HCT 16.1  09.6 - 04.5 %   MCV 88.8  78.0 - 100.0 fL   MCH 30.4  26.0 - 34.0 pg   MCHC 34.2  30.0 - 36.0 g/dL   RDW 40.9  81.1 - 91.4 %   Platelets 339  150 - 400 K/uL   Neutrophils Relative 56  43 - 77 %   Neutro Abs 6.4  1.7 - 7.7 K/uL   Lymphocytes Relative 35  12 - 46 %   Lymphs Abs 4.0  0.7 - 4.0 K/uL   Monocytes Relative 6  3 - 12 %   Monocytes Absolute 0.7  0.1 - 1.0 K/uL   Eosinophils Relative 2  0 - 5 %   Eosinophils Absolute 0.3  0.0 - 0.7 K/uL   Basophils Relative 1  0 - 1 %   Basophils Absolute 0.1  0.0 - 0.1 K/uL  BASIC METABOLIC PANEL      Component Value Range   Sodium 136  135 - 145 mEq/L   Potassium 3.2 (*) 3.5 - 5.1 mEq/L   Chloride 94 (*) 96 - 112 mEq/L   CO2 27  19 - 32 mEq/L   Glucose, Bld 117 (*) 70 - 99 mg/dL   BUN 17  6 - 23 mg/dL   Creatinine, Ser 7.82  0.50 - 1.35 mg/dL   Calcium 95.6  8.4 - 21.3 mg/dL   GFR calc non Af Amer >90  >  90 mL/min   GFR calc Af Amer >90  >90 mL/min  TROPONIN I      Component Value Range   Troponin I <0.30  <0.30 ng/mL   Dg Chest 2 View  05/28/2012  *RADIOLOGY REPORT*  Clinical Data: Chest pain  CHEST - 2 VIEW  Comparison: None.  Findings: Lungs clear.  Heart size and pulmonary vascularity are normal.  There is prominence in both paratracheal regions.  The possibility of  adenopathy must be of concern given this appearance.  No hilar fullness seen.  No bone lesions are appreciated.  Conclusion:  Prominence in both paratracheal regions.  Advise correlation with chest CT to further evaluate.  Given concern for adenopathy, contrast enhanced study would be optimal  if no contraindications.  No edema or consolidation.   Original Report Authenticated By: Bretta Bang, M.D.       1. Chest pain       MDM  41 year old male with chest pain.  10:43 AM Patient was given nitroglycerin on arrival, this completely resolved his chest pain. He took aspirin before coming to the emergency department. I will order routine labs.   3:54 PM Work up has been negative for acute process.  Ammon will see the patient and determine whether he needs to be admitted or discharged.  Patient had a CT angio today, and he is not fit enough to perform a treadmill stress test, therefore he would be a poor candidate for chest pain protocol.  I have discussed the patient with Johnnette Gourd, PA-C, who will continue care at this time.  Plan: disposition is pending [facility].       Roxy Horseman, PA-C 05/28/12 [phone]

## 2012-05-28 NOTE — ED Notes (Signed)
Patient transported to X-ray 

## 2012-05-28 NOTE — ED Notes (Signed)
Cp started 1 hour ago pain rads to left chest shoulder and back hurts to take a deep breath he states no nausea

## 2012-05-28 NOTE — ED Provider Notes (Signed)
Care assumed from [facility], PA-C. Patient is a 41 y/o morbidly obese male with pleuritic CP. [facility] evaluated patient and feels as if his chest pain is not cardiac. Advised to obtain second troponin. If negative, discharge patient. Dr. Excell Seltzer will set up outpatient follow up for patient and suggests f/u with PCP Dr. Deirdre Priest. Chest CT negative for PE. 5:38 PM Second troponin negative. Patient stable for discharge. Aware [place] will set up appt. Discussed importance of f/u with Dr. Deirdre Priest. Return precautions discussed.  Trevor Mace, PA-C 05/28/12 1739

## 2012-05-28 NOTE — ED Notes (Signed)
Patient placed on 2L [place] O2.

## 2012-05-28 NOTE — Consult Note (Signed)
CARDIOLOGY CONSULT NOTE  Patient ID: Justin [name] MRN: [id number] DOB/AGE: 10/30/1970 41 y.o.  Admit date: 05/28/2012 Referring Physician: Roxy Horseman Primary Physician: Dr Deirdre Priest Primary Cardiologist: new Reason for Consultation: Chest Pain  HPI: 41 year old gentleman who presented with chest pain. The patient complains of sharp, pleuritic left-sided chest pain. This began today. Other than inspiration, there are no exacerbating factors. He has had no substernal pain or pressure. He denies dyspnea, orthopnea, or PND. He's had no fever, chills, nausea, vomiting, or diaphoresis. He does have a nocturnal cough but this is chronic. The patient has obstructive sleep apnea. He has no personal history of cardiac disease. He has had partial relief of his chest pain with sublingual nitroglycerin.  The patient's initial emergency room workup has included cardiac troponin which has been negative. There has been a single set of cardiac markers sent. He underwent a CT of the chest to rule out pulmonary embolus and this was negative. There was a small pulmonary nodule seen. Followup was recommended in one year only if the patient is at high risk for bronchogenic carcinoma.  The patient is very sedentary. He weighs 400 pounds. He has a lot of problems with his feet and apparently has some bone spurs where there is planned surgery early next calendar year.  Past Medical History  Diagnosis Date  . Nephrolithiasis   . Obese   . Hypertension   . Kidney stones      History reviewed. No pertinent past surgical history.   Family History  Problem Relation Age of Onset  . Coronary artery disease Mother   . Diabetes Mother   . Stroke Mother     Social History: History   Social History  . Marital Status: Married    Spouse Name: N/A    Number of Children: N/A  . Years of Education: N/A   Occupational History  . Not on file.   Social History Main Topics  . Smoking status: Never Smoker    . Smokeless tobacco: Never Used  . Alcohol Use: No  . Drug Use: No  . Sexually Active: Yes -- Male partner(s)   Other Topics Concern  . Not on file   Social History Narrative   Marital Status: Married   Children: 2, son with special needs born in 2005 and daughter born in 2007   Occupation: Education officer, environmental / Runner, broadcasting/film/video on internet   For fun - computer and gaming    ROS: General: no fevers/chills/night sweats Eyes: no blurry vision, diplopia, or amaurosis ENT: no sore throat or hearing loss Resp: no wheezing, or hemoptysis. Positive for nonproductive cough CV: no edema or palpitations GI: no abdominal pain, nausea, vomiting, diarrhea, or constipation GU: no dysuria, frequency, or hematuria Skin: no rash Neuro: no headache, numbness, tingling, or weakness of extremities Musculoskeletal: no joint pain or swelling Heme: no bleeding, DVT, or easy bruising Endo: no polydipsia or polyuria    Physical Exam: Blood pressure 131/73, pulse 97, temperature 98.4 F (36.9 C), temperature source Oral, resp. rate 32, SpO2 96.00%.  Pt is alert and oriented, pleasant, morbidly obese male, in no distress. HEENT: normal Neck: JVP unable to visualize. Carotid upstrokes normal without bruits. No thyromegaly. Lungs: equal expansion, clear bilaterally CV: Apex is nonpalpable, RRR without murmur or gallop Abd: soft, NT, +BS, no bruit, obese Back: no CVA tenderness Ext: no C/C/E        DP/PT pulses intact and = Skin: warm and dry without rash Neuro: CNII-XII intact  Strength intact = bilaterally  Labs:   Lab Results  Component Value Date   WBC 11.4* 05/28/2012   HGB 15.5 05/28/2012   HCT 45.3 05/28/2012   MCV 88.8 05/28/2012   PLT 339 05/28/2012    Lab 05/28/12 0950  NA 136  K 3.2*  CL 94*  CO2 27  BUN 17  CREATININE 0.97  CALCIUM 10.0  PROT --  BILITOT --  ALKPHOS --  ALT --  AST --  GLUCOSE 117*   Lab Results  Component Value Date   TROPONINI <0.30 05/28/2012    Lab  Results  Component Value Date   CHOL 198 09/23/2009   CHOL 163 09/06/2005   Lab Results  Component Value Date   HDL 35* 09/23/2009   HDL 29 7/82/9562   Lab Results  Component Value Date   LDLCALC 117* 09/23/2009   LDLCALC 86 09/06/2005   Lab Results  Component Value Date   TRIG 231* 09/23/2009   TRIG 242 09/06/2005   Lab Results  Component Value Date   CHOLHDL 5.7 Ratio 09/23/2009   No results found for this basename: LDLDIRECT      Radiology: Dg Chest 2 View  05/28/2012  *RADIOLOGY REPORT*  Clinical Data: Chest pain  CHEST - 2 VIEW  Comparison: None.  Findings: Lungs clear.  Heart size and pulmonary vascularity are normal.  There is prominence in both paratracheal regions.  The possibility of adenopathy must be of concern given this appearance.  No hilar fullness seen.  No bone lesions are appreciated.  Conclusion:  Prominence in both paratracheal regions.  Advise correlation with chest CT to further evaluate.  Given concern for adenopathy, contrast enhanced study would be optimal  if no contraindications.  No edema or consolidation.   Original Report Authenticated By: Bretta Bang, M.D.    Ct Angio Chest Pe W/cm &/or Wo Cm  05/28/2012  *RADIOLOGY REPORT*  Clinical Data: Rule out pulmonary embolus.  Evaluate for lymphoma.  CT ANGIOGRAPHY CHEST  Technique:  Multidetector CT imaging of the chest using the standard protocol during bolus administration of intravenous contrast. Multiplanar reconstructed images including MIPs were obtained and reviewed to evaluate the vascular anatomy.  Contrast: OMNIPAQUE IOHEXOL 350 MG/ML SOLN  Comparison: None.  Findings: Lungs/pleura: No airspace consolidation or atelectasis. There is no pleural effusion noted.  Tiny nodule in the posterior medial left upper lobe measures 4 mm, image 38.  Heart/Mediastinum: Heart size is normal.  No pericardial effusion. No mediastinal or hilar adenopathy identified.  Pulmonary arteries are patent.  No evidence for  pulmonary embolus.  Upper abdomen: Mild diffuse low attenuation throughout the liver parenchyma is identified consistent with fatty infiltration.  No acute abnormality identified.  No upper abdominal adenopathy noted.  Bones/Musculoskeletal:  No axillary or supraclavicular adenopathy.  IMPRESSION:  1.  No evidence for acute pulmonary embolus. 2.  Fatty infiltration of the liver. 3.  Tiny left upper lobe nodule measures 4 mm.  This may represent a small granuloma. If the patient is at high risk for bronchogenic carcinoma, follow-up chest CT at 1 year is recommended.  If the patient is at low risk, no follow-up is needed.  This recommendation follows the consensus statement: Guidelines for Management of Small Pulmonary Nodules Detected on CT Scans:  A Statement from the Fleischner Society as published in Radiology 2005; 237:395-400.   Original Report Authenticated By: Signa Kell, M.D.     EKG: sinus tachycardia 102 bpm, otherwise WNL  ASSESSMENT AND PLAN:  41 year old gentleman with atypical chest pain. He has pleuritic chest pain that he describes as sharp. The only characteristic of his pain that points towards a possible cardiac source is that it is nitroglycerin responsive. Otherwise his chest pain is highly atypical. He has had a single set of troponins, and I have recommended a repeat lab draw at this point. If his second set of troponins are negative, I think he can be discharged from the emergency room. His EKG shows no ischemic changes. There is a low likelihood that this is an acute coronary event. Noninvasive stress testing will not be helpful because of this patient's marked obesity. We had a long discussion about the negative health effects of his weight. We discussed dietary modification and he understands the importance of this. I have recommended that he followup with his primary care physician. I'm going to set him up for a cardiology followup appointment in 2 weeks to make sure that his  chest pain syndrome is improving. He understands if he develops worsening chest pain, substernal discomfort, or progressive shortness of breath that he should call 911.  Tonny Bollman 05/28/2012, 4:10 PM

## 2012-05-29 NOTE — ED Provider Notes (Signed)
Medical screening examination/treatment/procedure(s) were performed by non-physician practitioner and as supervising physician I was immediately available for consultation/collaboration.  Vira Chaplin L Shernell Saldierna, MD 05/29/12 0051 

## 2012-05-30 NOTE — ED Provider Notes (Signed)
Medical screening examination/treatment/procedure(s) were conducted as a shared visit with non-physician practitioner(s) and myself.  I personally evaluated the patient during the encounter  41 yo obese male with chest pain.  Initial eval unremarkable.  No acute ischemic changes noted on EKG.  Consult with cardiology to coordinate further workup  Celene Kras, MD 05/30/12 1540

## 2012-10-11 ENCOUNTER — Other Ambulatory Visit: Payer: Self-pay | Admitting: Family Medicine

## 2012-12-09 ENCOUNTER — Ambulatory Visit (INDEPENDENT_AMBULATORY_CARE_PROVIDER_SITE_OTHER): Payer: PRIVATE HEALTH INSURANCE | Admitting: Family Medicine

## 2012-12-09 ENCOUNTER — Encounter: Payer: Self-pay | Admitting: Family Medicine

## 2012-12-09 VITALS — BP 132/85 | HR 108 | Temp 98.0°F | Ht 68.0 in | Wt >= 6400 oz

## 2012-12-09 DIAGNOSIS — M549 Dorsalgia, unspecified: Secondary | ICD-10-CM

## 2012-12-09 DIAGNOSIS — K625 Hemorrhage of anus and rectum: Secondary | ICD-10-CM

## 2012-12-09 DIAGNOSIS — I10 Essential (primary) hypertension: Secondary | ICD-10-CM

## 2012-12-09 MED ORDER — CYCLOBENZAPRINE HCL 5 MG PO TABS: 5.0000 mg | ORAL_TABLET | Freq: Three times a day (TID) | ORAL | Status: DC | PRN

## 2012-12-09 NOTE — Assessment & Plan Note (Addendum)
Most consistent with hemorrhoidal bleeding.  No red flags. No signs of anemia. We discussed there is a small chance of cancer as a cause and that colonoscopy would be reasonable. He has no ability to afford colonoscopy and elects to observe for now

## 2012-12-09 NOTE — Assessment & Plan Note (Signed)
Recurrent exacerbation.  Most consistent with musculoskeletal  Pain.  No red flags.  Analgesics and weight reduction

## 2012-12-09 NOTE — Patient Instructions (Addendum)
You have superficial varicose veins - they are not dangerous  The back pain is likely due to a strain. It should gradually get better over the next month.  Take tylenol and extra diclofenac for pain.  If you have any weakness or loss of bowel control or if not better in one month call and we will get an xray  Use stool softner Colace if your stools are hard and use prep H.  If the bleeding is worse or the diameter of your stools change or if you have lots of blood mixed in with the stool then come back.  Stop the Aspirin for a few weeks   Many of your health problems are weight related it is good you are working on this

## 2012-12-09 NOTE — Progress Notes (Signed)
  Subjective:    Patient ID: Justin [name], male    DOB: Apr 22, 1971, 42 y.o.   MRN: [id number]  HPI  Rectal bleeding - on and off for a week or so.  Usually painful with blood around stool.  May be mixed in occasionally.  No change in caliber of stool.  No abdomen pain. No weight loss.   Has had similar episode in distant past.  Takes asa daily  Back pain For one month.  No trauma.  Lower back on sides.  No radiation.  No incontinence or weakness.  Better with ibuprofen.  No fever or weight loss  HYPERTENSION Disease Monitoring Home BP Monitoring not checking Chest pain- no    Dyspnea- no Medications Compliance-  Daily . Lightheadedness-  no  Edema- no ROS - See HPI  PMH Lab Review   Potassium  Date Value Range Status  05/28/2012 3.2* 3.5 - 5.1 mEq/L Final     Sodium  Date Value Range Status  05/28/2012 136  135 - 145 mEq/L Final     Creat  Date Value Range Status  07/05/2011 0.96  0.50 - 1.35 mg/dL Final     Creatinine, Ser  Date Value Range Status  05/28/2012 0.97  0.50 - 1.35 mg/dL Final       FH - grandfather had colon cancer?  But no other closer relatives     Review of Systems     Objective:   Physical Exam Heart - Regular rate and rhythm.  No murmurs, gallops or rubs.    Alert obese no acute distress Lungs:  Normal respiratory effort, chest expands symmetrically. Lungs are clear to auscultation, no crackles or wheezes. Extremities:  No cyanosis, edema, or deformity noted with good range of motion of all major joints.  Small varicosities on thighs Rectal:small amount of red blood on finger tip.  No masses felt unable to reach prostate due to habitus Back - tender bilateral lower paraspinous muscles no midline tenderness or deformity.  Normal distalt strenght and ngegative slr       Assessment & Plan:

## 2012-12-09 NOTE — Assessment & Plan Note (Signed)
Good control.  Continue current medications Recent Bmet normal except for slightly low k.

## 2012-12-10 ENCOUNTER — Other Ambulatory Visit: Payer: Self-pay | Admitting: *Deleted

## 2012-12-10 ENCOUNTER — Other Ambulatory Visit: Payer: Self-pay | Admitting: Family Medicine

## 2012-12-10 MED ORDER — LISINOPRIL-HYDROCHLOROTHIAZIDE 20-25 MG PO TABS: 1.0000 | ORAL_TABLET | Freq: Every day | ORAL | Status: DC

## 2013-01-18 ENCOUNTER — Other Ambulatory Visit: Payer: Self-pay | Admitting: Family Medicine

## 2013-02-07 ENCOUNTER — Other Ambulatory Visit: Payer: Self-pay | Admitting: Family Medicine

## 2013-04-08 ENCOUNTER — Encounter: Payer: Self-pay | Admitting: Family Medicine

## 2013-04-08 ENCOUNTER — Ambulatory Visit (INDEPENDENT_AMBULATORY_CARE_PROVIDER_SITE_OTHER): Payer: Self-pay | Admitting: Family Medicine

## 2013-04-08 VITALS — BP 126/80 | HR 69 | Temp 98.1°F | Ht 68.5 in | Wt >= 6400 oz

## 2013-04-08 DIAGNOSIS — J209 Acute bronchitis, unspecified: Secondary | ICD-10-CM | POA: Insufficient documentation

## 2013-04-08 DIAGNOSIS — J069 Acute upper respiratory infection, unspecified: Secondary | ICD-10-CM

## 2013-04-08 DIAGNOSIS — R079 Chest pain, unspecified: Secondary | ICD-10-CM

## 2013-04-08 MED ORDER — ALBUTEROL SULFATE HFA 108 (90 BASE) MCG/ACT IN AERS: 2.0000 | INHALATION_SPRAY | Freq: Four times a day (QID) | RESPIRATORY_TRACT | Status: DC | PRN

## 2013-04-08 NOTE — Assessment & Plan Note (Signed)
Acute bronchitis due to URI. Will attempt trial of albuterol and otc cough/cold medications.

## 2013-04-08 NOTE — Assessment & Plan Note (Signed)
Patient presents with signs/symptoms consistent with URI/bronchitis. Conservative management discussed with the patient along with use of albuterol inhaler PRN cough.

## 2013-04-08 NOTE — Progress Notes (Addendum)
  Subjective:    Patient ID: KALEP FULL, male    DOB: 17-May-1971, 42 y.o.   MRN: [id number]  HPI 42 y/o male presents with 5 day history of cough, productive of sputum, subjective fevers and chills, no objective fevers, associated sob/wheezing, rhinorrhea and nasal congestion present, no sick contacts, does have some chest pain with coughing, has been taking Dayquil/Nyquil and Mucinex which has provided some relief of symptoms, also has generalized body aches  He is a nonsmoker however had multiple family members that smoked growing up.    Review of Systems  Constitutional: Negative for fever, chills, activity change and fatigue.  HENT: Positive for congestion, postnasal drip, rhinorrhea and sinus pressure. Negative for ear pain and sore throat.   Eyes: Negative for discharge and itching.  Respiratory: Positive for cough, chest tightness, shortness of breath and wheezing. Negative for apnea.   Gastrointestinal: Positive for constipation. Negative for nausea and vomiting.  Musculoskeletal: Positive for myalgias.  Skin: Negative for rash.       Objective:   Physical Exam Vitals: reviewed HEENT: normocephalic, PERRL, EOMI, bilateral TM's pearly grey without bulging or erythema, rhinorrhea present, nasal septum midline, MMM, no pharyngeal erythema or exudate, 3+ tonsils, neck supple, no anterior or posterior lymphadenopathy Cardiac: RRR, S1 and S2 present, no murmurs, no heaves/thrills, anterior chest pain reproducible with palpation Resp: scatter rhonchi, normal effort ABD: soft, obese, normal bowel sounds Skin: no rash      Assessment & Plan:  Please see problem specific assessment and plan.

## 2013-04-08 NOTE — Patient Instructions (Signed)

## 2013-04-08 NOTE — Assessment & Plan Note (Signed)
Associated with cough and has reproducible pain with palpation. Likely MSK. No cardiac workup at this time. If persists or worsens will need cardiac workup.

## 2013-04-24 ENCOUNTER — Other Ambulatory Visit: Payer: Self-pay

## 2013-05-10 ENCOUNTER — Other Ambulatory Visit: Payer: Self-pay | Admitting: Family Medicine

## 2013-05-12 ENCOUNTER — Other Ambulatory Visit: Payer: Self-pay | Admitting: Family Medicine

## 2013-05-12 MED ORDER — ALLOPURINOL 300 MG PO TABS: 300.0000 mg | ORAL_TABLET | Freq: Every day | ORAL | Status: DC

## 2013-06-17 ENCOUNTER — Emergency Department (HOSPITAL_COMMUNITY)
Admission: EM | Admit: 2013-06-17 | Discharge: 2013-06-17 | Disposition: A | Discharge status: Home / Self Care | Payer: Self-pay | Attending: Emergency Medicine | Admitting: Emergency Medicine

## 2013-06-17 ENCOUNTER — Encounter (HOSPITAL_COMMUNITY): Payer: Self-pay | Admitting: Emergency Medicine

## 2013-06-17 ENCOUNTER — Emergency Department (HOSPITAL_COMMUNITY): Payer: Self-pay

## 2013-06-17 DIAGNOSIS — I10 Essential (primary) hypertension: Secondary | ICD-10-CM | POA: Insufficient documentation

## 2013-06-17 DIAGNOSIS — K644 Residual hemorrhoidal skin tags: Secondary | ICD-10-CM | POA: Insufficient documentation

## 2013-06-17 DIAGNOSIS — Z7982 Long term (current) use of aspirin: Secondary | ICD-10-CM | POA: Insufficient documentation

## 2013-06-17 DIAGNOSIS — R109 Unspecified abdominal pain: Secondary | ICD-10-CM | POA: Insufficient documentation

## 2013-06-17 DIAGNOSIS — Z791 Long term (current) use of non-steroidal anti-inflammatories (NSAID): Secondary | ICD-10-CM | POA: Insufficient documentation

## 2013-06-17 DIAGNOSIS — R110 Nausea: Secondary | ICD-10-CM | POA: Insufficient documentation

## 2013-06-17 DIAGNOSIS — K625 Hemorrhage of anus and rectum: Secondary | ICD-10-CM

## 2013-06-17 DIAGNOSIS — N200 Calculus of kidney: Secondary | ICD-10-CM | POA: Insufficient documentation

## 2013-06-17 DIAGNOSIS — Z79899 Other long term (current) drug therapy: Secondary | ICD-10-CM | POA: Insufficient documentation

## 2013-06-17 LAB — COMPREHENSIVE METABOLIC PANEL
ALT: 26 U/L (ref 0–53)
AST: 15 U/L (ref 0–37)
Albumin: 3.5 g/dL (ref 3.5–5.2)
Alkaline Phosphatase: 71 U/L (ref 39–117)
BUN: 15 mg/dL (ref 6–23)
CO2: 26 mEq/L (ref 19–32)
Calcium: 8.9 mg/dL (ref 8.4–10.5)
Chloride: 103 mEq/L (ref 96–112)
Creatinine, Ser: 0.93 mg/dL (ref 0.50–1.35)
GFR calc Af Amer: >90 mL/min (ref >90)
GFR calc non Af Amer: >90 mL/min (ref >90)
Glucose, Bld: 123 mg/dL — ABNORMAL HIGH (ref 70–99)
Potassium: 3.9 mEq/L (ref 3.7–5.3)
Sodium: 142 mEq/L (ref 137–147)
Total Bilirubin: 0.3 mg/dL (ref 0.3–1.2)
Total Protein: 7.3 g/dL (ref 6.0–8.3)

## 2013-06-17 LAB — CBC WITH DIFFERENTIAL/PLATELET
Basophils Absolute: 0.1 10*3/uL (ref 0.0–0.1)
Basophils Relative: 1 % (ref 0–1)
Eosinophils Absolute: 0.3 10*3/uL (ref 0.0–0.7)
Eosinophils Relative: 3 % (ref 0–5)
HCT: 42.6 % (ref 39.0–52.0)
Hemoglobin: 14.5 g/dL (ref 13.0–17.0)
Lymphocytes Relative: 29 % (ref 12–46)
Lymphs Abs: 2.9 10*3/uL (ref 0.7–4.0)
MCH: 31.7 pg (ref 26.0–34.0)
MCHC: 34 g/dL (ref 30.0–36.0)
MCV: 93.2 fL (ref 78.0–100.0)
Monocytes Absolute: 0.4 10*3/uL (ref 0.1–1.0)
Monocytes Relative: 4 % (ref 3–12)
Neutro Abs: 6.5 10*3/uL (ref 1.7–7.7)
Neutrophils Relative %: 64 % (ref 43–77)
Platelets: 304 10*3/uL (ref 150–400)
RBC: 4.57 MIL/uL (ref 4.22–5.81)
RDW: 14.3 % (ref 11.5–15.5)
WBC: 10.1 10*3/uL (ref 4.0–10.5)

## 2013-06-17 LAB — URINALYSIS, ROUTINE W REFLEX MICROSCOPIC
Bilirubin Urine: NEGATIVE
Glucose, UA: NEGATIVE mg/dL
Hgb urine dipstick: NEGATIVE
Ketones, ur: NEGATIVE mg/dL
Leukocytes, UA: NEGATIVE
Nitrite: NEGATIVE
Protein, ur: NEGATIVE mg/dL
Specific Gravity, Urine: 1.011 (ref 1.005–1.030)
Urobilinogen, UA: 0.2 mg/dL (ref 0.0–1.0)
pH: 7.5 (ref 5.0–8.0)

## 2013-06-17 LAB — OCCULT BLOOD, POC DEVICE: Fecal Occult Bld: POSITIVE — AB

## 2013-06-17 MED ORDER — ONDANSETRON HCL 4 MG/2ML IJ SOLN
4.0000 mg | Freq: Once | INTRAMUSCULAR | Status: AC
  Administered 2013-06-17: 4 mg via INTRAVENOUS
  Filled 2013-06-17: qty 2

## 2013-06-17 MED ORDER — TRAMADOL HCL 50 MG PO TABS: 50.0000 mg | ORAL_TABLET | Freq: Four times a day (QID) | ORAL | Status: DC | PRN

## 2013-06-17 MED ORDER — KETOROLAC TROMETHAMINE 30 MG/ML IJ SOLN
30.0000 mg | Freq: Once | INTRAMUSCULAR | Status: AC
  Administered 2013-06-17: 30 mg via INTRAVENOUS
  Filled 2013-06-17: qty 1

## 2013-06-17 MED ORDER — FENTANYL CITRATE 0.05 MG/ML IJ SOLN
50.0000 ug | Freq: Once | INTRAMUSCULAR | Status: AC
  Administered 2013-06-17: 50 ug via NASAL
  Filled 2013-06-17: qty 2

## 2013-06-17 NOTE — ED Notes (Signed)
Pt is here with left lower back pain that he thinks may be related to kidney stone pain that he has had before.  Pt reports rectal bleeding since last nite.  No abdominal pain.

## 2013-06-17 NOTE — ED Provider Notes (Signed)
CSN: [id number]     Arrival date & time 06/17/13  4098 History   First MD Initiated Contact with Patient 06/17/13 1120     Chief Complaint  Patient presents with  . Back Pain  . Rectal Bleeding   (Consider location/radiation/quality/duration/timing/severity/associated sxs/prior Treatment) The history is provided by the patient.   This is a 42 year old male with past medical history significant for hypertension, nephrolithiasis, presenting to the ED for left lower flank pain. Patient states pain has been present intermittently over the past 3 days but has become constant since 2000 last night.  Endorses some associated nausea this morning but no vomiting.  Some mild dysuria but no hematuria.  No fevers, sweats, or chills.  Pt also notes 2 episodes of bright red rectal bleeding last night following bowel movements.  Pt has hx of hemorrhoids and thought it was due to the same as he has also had some rectal pain.  No episodes of rectal bleeding today.  No prior hx of GI bleed.  Pt on daily ASA, no other anti-coagulants.  Pt has never had a colonoscopy--grandfather had colon cancer.  Denies any dizziness, weakness, or palpitations.  Past Medical History  Diagnosis Date  . Nephrolithiasis   . Obese   . Hypertension   . Kidney stones    History reviewed. No pertinent past surgical history. Family History  Problem Relation Age of Onset  . Coronary artery disease Mother   . Diabetes Mother   . Stroke Mother    History  Substance Use Topics  . Smoking status: Never Smoker   . Smokeless tobacco: Never Used  . Alcohol Use: No    Review of Systems  Gastrointestinal: Positive for nausea and anal bleeding.  Genitourinary: Positive for dysuria and flank pain.  All other systems reviewed and are negative.    Allergies  Sulfonamide derivatives  Home Medications   Current Outpatient Rx  Name  Route  Sig  Dispense  Refill  . albuterol (PROVENTIL HFA;VENTOLIN HFA) 108 (90 BASE) MCG/ACT  inhaler   Inhalation   Inhale 2 puffs into the lungs every 6 (six) hours as needed for wheezing.   1 Inhaler   0   . allopurinol (ZYLOPRIM) 300 MG tablet   Oral   Take 1 tablet (300 mg total) by mouth daily.   90 tablet   0   . aspirin EC 81 MG tablet   Oral   Take 81 mg by mouth daily.         . Cholecalciferol (VITAMIN D PO)   Oral   Take 1 tablet by mouth daily.         . diclofenac (VOLTAREN) 75 MG EC tablet   Oral   Take 75 mg by mouth 2 (two) times daily.         Marland Kitchen FLUoxetine (PROZAC) 40 MG capsule   Oral   Take 40 mg by mouth daily.         . furosemide (LASIX) 20 MG tablet   Oral   Take 20 mg by mouth daily as needed. To get rid of some of the fluid on your legs         . lisinopril-hydrochlorothiazide (PRINZIDE,ZESTORETIC) 20-25 MG per tablet   Oral   Take 1 tablet by mouth daily.   90 tablet   3   . Multiple Vitamin (MULTIVITAMIN WITH MINERALS) TABS   Oral   Take 1 tablet by mouth daily.         Marland Kitchen  OVER THE COUNTER MEDICATION   Oral   Take 1 tablet by mouth daily. lypozeme         . Zinc 50 MG TABS   Oral   Take 1 tablet by mouth daily.           BP 174/90  Pulse 97  Temp(Src) 98.6 F (37 C) (Oral)  Resp 24  Wt 391 lb (177.356 kg)  SpO2 94%  Physical Exam  Nursing note and vitals reviewed. Constitutional: He is oriented to person, place, and time. He appears well-developed and well-nourished. No distress.  Morbidly obese; lying comfortably in bed, NAD  HENT:  Head: Normocephalic and atraumatic.  Mouth/Throat: Oropharynx is clear and moist.  Eyes: Conjunctivae and EOM are normal. Pupils are equal, round, and reactive to light.  Neck: Normal range of motion.  Cardiovascular: Normal rate, regular rhythm and normal heart sounds.   Pulmonary/Chest: Effort normal and breath sounds normal. No respiratory distress. He has no wheezes.  Abdominal: Soft. Bowel sounds are normal. There is no tenderness. There is CVA tenderness (left).  There is no guarding.  Genitourinary: Rectal exam shows external hemorrhoid. Rectal exam shows no internal hemorrhoid, no fissure, no mass, no tenderness and anal tone normal.  Rectum exquisitely tender; Small, non-thrombosed, non-bleeding external hemorrhoid; no gross blood on exam  Musculoskeletal: Normal range of motion.  Neurological: He is alert and oriented to person, place, and time.  Skin: Skin is warm and dry. He is not diaphoretic.  Psychiatric: He has a normal mood and affect.    ED Course  Procedures (including critical care time) Labs Review Labs Reviewed  COMPREHENSIVE METABOLIC PANEL - Abnormal; Notable for the following:    Glucose, Bld 123 (*)    All other components within normal limits  OCCULT BLOOD, POC DEVICE - Abnormal; Notable for the following:    Fecal Occult Bld POSITIVE (*)    All other components within normal limits  CBC WITH DIFFERENTIAL  URINALYSIS, ROUTINE W REFLEX MICROSCOPIC   Imaging Review Ct Abdomen Pelvis Wo Contrast  06/17/2013   CLINICAL DATA:  Back pain.  Rectal bleeding.  EXAM: CT ABDOMEN AND PELVIS WITHOUT CONTRAST  TECHNIQUE: Multidetector CT imaging of the abdomen and pelvis was performed following the standard protocol without intravenous contrast.  COMPARISON:  No recent exams available for comparison.  FINDINGS: No focal hepatic abnormality. Spleen normal. No focal significant pancreatic abnormality identified, a few small foci of fat noted distributed throughout the pancreas. This is a nonspecific finding. No gallbladder or biliary distention.  Adrenals normal. Bilateral renal cortical irregularities , these are most likely related to small cyst. They are too small to accurately evaluate, the largest is in the right upper renal pole and does have Hounsfield units of 6 suggesting that it is a simple cyst. Bilateral nephrolithiasis. No evidence of urolithiasis. No bladder distention. Prostate is unremarkable. No free pelvic fluid collections.   What appears to be the appendix is most likely normal. No inflammatory changes in right or left lower quadrant. No bowel obstruction. Stomach is nondistended. No free air.  Small inguinal lymph nodes. No retroperitoneal adenopathy. Aorta normal caliber.  Lung bases are clear. Heart size normal. Abdominal wall intact. Degenerative lumbar spine and hips.  IMPRESSION: 1. Bilateral nephrolithiasis. No evidence of urolithiasis or hydronephrosis. 2. No acute abnormality identified.   Electronically Signed   By: Maisie Fus  Register   On: 06/17/2013 12:37    EKG Interpretation   None  MDM   1. Nephrolithiasis   2. Rectal bleeding   3. External hemorrhoids    Labs reassuring. UA without signs of infection. CT revealing bilateral nephrolithiasis without obstruction or hydronephrosis.  FOBT +, likely from external hemorrhoids given exquisite tenderness of rectum, no gross blood on physical exam.  H/H stable today.  Pt afebrile, non-toxic appearing, NAD, VS stable- ok for discharge.  Pt will be referred to GI for screening colonoscopy as he does have first degree relative with colon cancer.  Pt also given urology FU should problems occur.  Rx tramadol.  Discussed plan with pt, he agreed.  Return precautions advised.  Discussed case with Dr. Juleen China who agrees with assessment and plan of care.  Garlon Hatchet, PA-C 06/17/13 1400

## 2013-06-22 NOTE — ED Provider Notes (Signed)
Medical screening examination/treatment/procedure(s) were performed by non-physician practitioner and as supervising physician I was immediately available for consultation/collaboration.  EKG Interpretation   None        Raeford RazorStephen Janila Arrazola, MD 06/22/13 1702

## 2013-07-15 ENCOUNTER — Ambulatory Visit (INDEPENDENT_AMBULATORY_CARE_PROVIDER_SITE_OTHER): Payer: No Typology Code available for payment source | Admitting: Family Medicine

## 2013-07-15 ENCOUNTER — Encounter: Payer: Self-pay | Admitting: Family Medicine

## 2013-07-15 VITALS — BP 145/62 | HR 96 | Temp 98.4°F | Ht 68.0 in | Wt >= 6400 oz

## 2013-07-15 DIAGNOSIS — L259 Unspecified contact dermatitis, unspecified cause: Secondary | ICD-10-CM

## 2013-07-15 DIAGNOSIS — R21 Rash and other nonspecific skin eruption: Secondary | ICD-10-CM | POA: Insufficient documentation

## 2013-07-15 DIAGNOSIS — L309 Dermatitis, unspecified: Secondary | ICD-10-CM | POA: Insufficient documentation

## 2013-07-15 MED ORDER — METHYLPREDNISOLONE ACETATE 80 MG/ML IJ SUSP
80.0000 mg | Freq: Once | INTRAMUSCULAR | Status: AC
  Administered 2013-07-15: 80 mg via INTRAMUSCULAR

## 2013-07-15 MED ORDER — METHYLPREDNISOLONE ACETATE 40 MG/ML INJ SUSP (RADIOLOG: 80.0000 mg | Freq: Once | INTRAMUSCULAR | Status: DC

## 2013-07-15 MED ORDER — METHYLPREDNISOLONE ACETATE 80 MG/ML IJ SUSP
80.0000 mg | Freq: Once | INTRAMUSCULAR | Status: DC
Start: 2013-07-15 — End: 2013-07-15

## 2013-07-15 MED ORDER — VALACYCLOVIR HCL 1 G PO TABS: 1000.0000 mg | ORAL_TABLET | Freq: Three times a day (TID) | ORAL | Status: DC

## 2013-07-15 MED ORDER — METHYLPREDNISOLONE ACETATE 40 MG/ML IJ SUSP
40.0000 mg | Freq: Once | INTRAMUSCULAR | Status: AC
  Administered 2013-07-15: 40 mg via INTRAMUSCULAR

## 2013-07-15 NOTE — Patient Instructions (Signed)
Justin [name],  Your rash looks like urticaria more than shingles.  For this reason you were treated with  depo medrol (steroid) 120 mg IM x 1.  This will last in your system for 3 days.   If the rash spread or you develop blisters please pick up and start the valtrex I sent to your pharmacy. Also call and come in for re-evaluation.  Dr. Armen PickupFunches   Hives Hives are itchy, red, puffy (swollen) areas of the skin. Hives can change in size and location on your body. Hives can come and go for hours, days, or weeks. Hives do not spread from person to person (noncontagious). Scratching, exercise, and stress can make your hives worse. HOME CARE  Avoid things that cause your hives (triggers).  Take antihistamine medicines as told by your doctor. Do not drive while taking an antihistamine.  Take any other medicines for itching as told by your doctor.  Wear loose-fitting clothing.  Keep all doctor visits as told. GET HELP RIGHT AWAY IF:   You have a fever.  Your tongue or lips are puffy.  You have trouble breathing or swallowing.  You feel tightness in the throat or chest.  You have belly (abdominal) pain.  You have lasting or severe itching that is not helped by medicine.  You have painful or puffy joints. These problems may be the first sign of a life-threatening allergic reaction. Call your local emergency services (911 in [place] MAKE SURE YOU:   Understand these instructions.  Will watch your condition.  Will get help right away if you are not doing well or get worse. Document Released: 03/14/2008 Document Revised: 12/05/2011 Document Reviewed: 08/29/2011 [facility] Patient Information 2014 [place]Care, [place].

## 2013-07-15 NOTE — Assessment & Plan Note (Signed)
A: irritant dermatitis in gluteal area. P: recommended barrier cream like A&D ointment.

## 2013-07-15 NOTE — Assessment & Plan Note (Addendum)
A: rash with the appearance of urticaria on R arm. Small pustules on R upper back could be acne vs mild MRSA colonization.  P: For this reason you were treated with  depo medrol (steroid) 120 mg IM x 1.  This will last in your system for 3 days.   If the rash spread or you develop blisters please pick up and start the valtrex I sent to your pharmacy. Also call and come in for re-evaluation.

## 2013-07-15 NOTE — Progress Notes (Signed)
   Subjective:    Patient ID: Justin [name], male    DOB: 12-05-1970, 43 y.o.   MRN: [id number]  HPI 43 yo M with history of morbid obesity and MRSA cellulitis presents for same day visit for the following:  1. Rash: acute onset yesterday AM. R arms-ventral wrist, upper arm and upper back. Erythematous macules. Some pustules on back. Associated with itching and burning sensation. He denies lip swelling, tongue swelling, SOB, wheezing.  Patient tried taking benadryl two tabs last night w/o relief. He denies H/O shingles. Has h/o chicken pox.  He denies new medications including antibiotics, skin products, exposure to plants or pets, new foods, new accessories (watches or necklace).    Review of Systems As per HPI     Objective:   Physical Exam  Skin:      BP 145/62  Pulse 96  Temp(Src) 98.4 F (36.9 C) (Oral)  Ht 5\' 8"  (1.727 m)  Wt 423 lb (191.872 kg)  BMI 64.33 kg/m2 General appearance: alert, cooperative and no distress Throat: lips, mucosa, and tongue normal; teeth and gums normal Lymph nodes: no R axillary adenopathy    Treated with depo medrol 120 mg IM x 1        Assessment & Plan:

## 2013-08-28 ENCOUNTER — Ambulatory Visit (INDEPENDENT_AMBULATORY_CARE_PROVIDER_SITE_OTHER): Payer: No Typology Code available for payment source | Admitting: Podiatry

## 2013-08-28 ENCOUNTER — Encounter: Payer: Self-pay | Admitting: Podiatry

## 2013-08-28 VITALS — BP 141/89 | HR 95 | Resp 22 | Ht 69.0 in | Wt 371.0 lb

## 2013-08-28 DIAGNOSIS — M7660 Achilles tendinitis, unspecified leg: Secondary | ICD-10-CM

## 2013-08-28 DIAGNOSIS — M722 Plantar fascial fibromatosis: Secondary | ICD-10-CM

## 2013-08-28 MED ORDER — OXYCODONE-ACETAMINOPHEN 10-325 MG PO TABS: 1.0000 | ORAL_TABLET | ORAL | Status: DC | PRN

## 2013-08-28 MED ORDER — METHYLPREDNISOLONE (PAK) 4 MG PO TABS: ORAL_TABLET | ORAL | Status: DC

## 2013-08-28 NOTE — Progress Notes (Signed)
Follow up on both heels and back of heels , left is hurting more than the right . Finally got insurance back , so now we can do something about these feet .  Objective: Vital signs are stable he is alert and oriented x3. Pulses are palpable bilateral. Pain on palpation of the tendo Achilles at its insertion site bilateral. He also has pain on palpation medial calcaneal tubercles bilateral.  Assessment: Plantar fasciitis bilateral with compensatory tendo Achilles tendinitis bilateral.  Plan: Discussed etiology pathology conservative or surgical therapies injected Kenalog left plantar fascia and dexamethasone to the posterior right tendo Achilles area. Put him in night splint bilateral. Will followup with him in one month. We also sent prescription over for methylprednisolone. He'll continue all conservative therapies icing stretching shoe gear modifications.

## 2013-08-29 ENCOUNTER — Other Ambulatory Visit: Payer: Self-pay | Admitting: *Deleted

## 2013-08-29 MED ORDER — FLUOXETINE HCL 40 MG PO CAPS: 40.0000 mg | ORAL_CAPSULE | Freq: Every day | ORAL | Status: DC

## 2013-09-02 ENCOUNTER — Other Ambulatory Visit: Payer: Self-pay | Admitting: *Deleted

## 2013-09-02 MED ORDER — ALLOPURINOL 300 MG PO TABS: 300.0000 mg | ORAL_TABLET | Freq: Every day | ORAL | Status: DC

## 2013-09-09 ENCOUNTER — Telehealth: Payer: Self-pay | Admitting: *Deleted

## 2013-09-09 NOTE — Telephone Encounter (Signed)
Requested a refill for Methylprednisolone 4 mg Dosepk.   Dr. Al CorpusHyatt denied the refill.  Will re-evaluate patient at next visit, 09/18/13, at which time may consider a refill if needed.

## 2013-09-12 ENCOUNTER — Ambulatory Visit (INDEPENDENT_AMBULATORY_CARE_PROVIDER_SITE_OTHER): Payer: No Typology Code available for payment source | Admitting: Family Medicine

## 2013-09-12 ENCOUNTER — Encounter: Payer: Self-pay | Admitting: Family Medicine

## 2013-09-12 VITALS — BP 139/85 | HR 98 | Temp 98.2°F | Ht 69.0 in | Wt >= 6400 oz

## 2013-09-12 DIAGNOSIS — K602 Anal fissure, unspecified: Secondary | ICD-10-CM | POA: Insufficient documentation

## 2013-09-12 LAB — POCT HEMOGLOBIN: Hemoglobin: 14 g/dL — AB (ref 14.1–18.1)

## 2013-09-12 NOTE — Progress Notes (Signed)
     Patient ID: Justin [name], male    DOB: 1971-01-29, 43 y.o.   MRN: [id number] Subjective:    Justin MohWilliam C Lorton is a 43 y.o. male here for evaluation of blood per rectum. Started 5 days ago during a bowel movement. Some associated pain. Blood was bright red. Last saw blood this AM. No pain now. No fever, chills, diarrhea. No rectal trauma. Has history of rectal bleed attributed to hemorrhoid. Reports 2-3 moderate sized BMs daily. Some BMs associated with pain/hard stool. Admits to low fiber diet. Paternal grandfather had colon CA in 6970s. No other family history of colon cancer. No person or family history of IBS.  2. Intermittent CPs: deferred to fu. No CP today.   Soc hx: non smoker   Review of Systems Pertinent items are noted in HPI.    Objective:    BP 139/85  Pulse 98  Temp(Src) 98.2 F (36.8 C) (Oral)  Ht 5\' 9"  (1.753 m)  Wt 423 lb 14.4 oz (192.28 kg)  BMI 62.57 kg/m2 General appearance: alert, cooperative, no distress and morbidly obese Abdomen: soft, non-tender; bowel sounds normal; no masses,  no organomegaly Rectal: normal stone, tender on exam posteriorly, FOBT positive  Anoscopy reveals mucosal tear at 6 o'clock. Bright red blood per rectum   Lab Results  Component Value Date   HGB 14.0* 09/12/2013      Assessment:    Rectal bleeding, likely secondary to anal fissure .    Plan:    1. Begin medication: Metamucil . 2. Sitz baths  Follow up in 1 weeks, or sooner should symptoms worsen.  HPI   Review of Systems      Physical Exam

## 2013-09-12 NOTE — Patient Instructions (Addendum)
Mr. and Mrs.  Will,  Thank you for coming in today.  Your exam is consistent with anal fissure: please see information below. There are a number of treatment options for this: Initial treatment involves bulking stool and keeping with soft with a goal of 1 bowel movement daily. Also sitz baths.    A high fiber diet with plenty of fluids (up to 8 glasses of water daily) is suggested to relieve these symptoms.  Metamucil, 1 tablespoon once or twice daily can be used to keep bowels regular if needed.  Anal Fissure, Adult An anal fissure is a small tear or crack in the skin around the opening of the butt (anus).Bleeding from the tear or crack usually stops on its own within a few minutes. The bleeding may happen every time you poop until the tear or crack heals. HOME CARE  Eat lots of fruit, whole grains, and vegetables. Avoid foods like bananas and dairy products. These foods can make it hard to poop.  Take a warm water bath (sitz bath) as told by your doctor.  Drink enough fluids to keep your pee (urine) clear or pale yellow.  Only take medicines as told by your doctor. Do not take aspirin.  Do not use numbing creams or hydrocortisone cream on the area. These creams can slow healing. GET HELP RIGHT AWAY IF:  Your tear or crack is not healed in 3 days.  You have more bleeding.  You have a fever.  You have watery poop (diarrhea) mixed with blood.  You have pain.  You are getting worse, not better. MAKE SURE YOU:   Understand these instructions.  Will watch your condition.  Will get help right away if you are not doing well or get worse. Document Released: 02/01/2011 Document Revised: 08/28/2011 Document Reviewed: 02/01/2011 [facility] Patient Information 2014 [place]ExitCare, [place]LL[place].  F/u recommended in 1 week to f/u rectal bleeding and discuss chest pain concerns.   Dr. Armen PickupFunches

## 2013-09-12 NOTE — Assessment & Plan Note (Signed)
A: Rectal bleeding, likely secondary to anal fissure. P: 1.  Begin medication: Metamucil . 2. Sitz baths  Follow up in 1 weeks, or sooner should symptoms worsen.

## 2013-09-16 ENCOUNTER — Other Ambulatory Visit: Payer: Self-pay

## 2013-09-16 ENCOUNTER — Encounter (HOSPITAL_COMMUNITY): Payer: Self-pay | Admitting: Emergency Medicine

## 2013-09-16 ENCOUNTER — Emergency Department (HOSPITAL_COMMUNITY): Payer: No Typology Code available for payment source

## 2013-09-16 ENCOUNTER — Ambulatory Visit: Payer: No Typology Code available for payment source | Admitting: Podiatry

## 2013-09-16 ENCOUNTER — Observation Stay (HOSPITAL_COMMUNITY)
Admission: EM | Admit: 2013-09-16 | Discharge: 2013-09-18 | Disposition: A | Discharge status: Home / Self Care | Payer: No Typology Code available for payment source | Attending: Family Medicine | Admitting: Family Medicine

## 2013-09-16 DIAGNOSIS — I517 Cardiomegaly: Secondary | ICD-10-CM

## 2013-09-16 DIAGNOSIS — Z9189 Other specified personal risk factors, not elsewhere classified: Secondary | ICD-10-CM | POA: Diagnosis present

## 2013-09-16 DIAGNOSIS — M109 Gout, unspecified: Secondary | ICD-10-CM

## 2013-09-16 DIAGNOSIS — R079 Chest pain, unspecified: Secondary | ICD-10-CM

## 2013-09-16 DIAGNOSIS — R0789 Other chest pain: Principal | ICD-10-CM | POA: Insufficient documentation

## 2013-09-16 DIAGNOSIS — I10 Essential (primary) hypertension: Secondary | ICD-10-CM | POA: Insufficient documentation

## 2013-09-16 DIAGNOSIS — Z79899 Other long term (current) drug therapy: Secondary | ICD-10-CM | POA: Insufficient documentation

## 2013-09-16 DIAGNOSIS — Z7982 Long term (current) use of aspirin: Secondary | ICD-10-CM | POA: Insufficient documentation

## 2013-09-16 DIAGNOSIS — F411 Generalized anxiety disorder: Secondary | ICD-10-CM

## 2013-09-16 DIAGNOSIS — M7989 Other specified soft tissue disorders: Secondary | ICD-10-CM | POA: Insufficient documentation

## 2013-09-16 DIAGNOSIS — R0602 Shortness of breath: Secondary | ICD-10-CM | POA: Insufficient documentation

## 2013-09-16 DIAGNOSIS — Z87442 Personal history of urinary calculi: Secondary | ICD-10-CM | POA: Insufficient documentation

## 2013-09-16 LAB — BASIC METABOLIC PANEL
BUN: 16 mg/dL (ref 6–23)
CALCIUM: 9.6 mg/dL (ref 8.4–10.5)
CO2: 25 mEq/L (ref 19–32)
Chloride: 100 mEq/L (ref 96–112)
Creatinine, Ser: 0.9 mg/dL (ref 0.50–1.35)
Glucose, Bld: 113 mg/dL — ABNORMAL HIGH (ref 70–99)
POTASSIUM: 3.6 meq/L — AB (ref 3.7–5.3)
Sodium: 142 mEq/L (ref 137–147)

## 2013-09-16 LAB — PRO B NATRIURETIC PEPTIDE: Pro B Natriuretic peptide (BNP): 23.6 pg/mL (ref 0–125)

## 2013-09-16 LAB — TSH: TSH: 1.72 u[IU]/mL (ref 0.350–4.500)

## 2013-09-16 LAB — CBC WITH DIFFERENTIAL/PLATELET
BASOS PCT: 1 % (ref 0–1)
Basophils Absolute: 0.1 10*3/uL (ref 0.0–0.1)
EOS ABS: 0.2 10*3/uL (ref 0.0–0.7)
Eosinophils Relative: 2 % (ref 0–5)
HCT: 44.4 % (ref 39.0–52.0)
HEMOGLOBIN: 15.1 g/dL (ref 13.0–17.0)
Lymphocytes Relative: 36 % (ref 12–46)
Lymphs Abs: 3.3 10*3/uL (ref 0.7–4.0)
MCH: 31.8 pg (ref 26.0–34.0)
MCHC: 34 g/dL (ref 30.0–36.0)
MCV: 93.5 fL (ref 78.0–100.0)
Monocytes Absolute: 0.4 10*3/uL (ref 0.1–1.0)
Monocytes Relative: 4 % (ref 3–12)
Neutro Abs: 5.2 10*3/uL (ref 1.7–7.7)
Neutrophils Relative %: 57 % (ref 43–77)
Platelets: 285 10*3/uL (ref 150–400)
RBC: 4.75 MIL/uL (ref 4.22–5.81)
RDW: 14.5 % (ref 11.5–15.5)
WBC: 9.2 10*3/uL (ref 4.0–10.5)

## 2013-09-16 LAB — D-DIMER, QUANTITATIVE: D-Dimer, Quant: <0.27 ug/mL-FEU (ref 0.00–0.48)

## 2013-09-16 LAB — TROPONIN I
Troponin I: <0.3 ng/mL (ref <0.30)
Troponin I: <0.3 ng/mL (ref <0.30)
Troponin I: <0.3 ng/mL (ref <0.30)

## 2013-09-16 LAB — HEMOGLOBIN A1C
Hgb A1c MFr Bld: 5.7 % — ABNORMAL HIGH (ref <5.7)
Mean Plasma Glucose: 117 mg/dL — ABNORMAL HIGH (ref <117)

## 2013-09-16 MED ORDER — ASPIRIN 81 MG PO CHEW
324.0000 mg | CHEWABLE_TABLET | Freq: Once | ORAL | Status: AC
  Administered 2013-09-16: 324 mg via ORAL
  Filled 2013-09-16: qty 4

## 2013-09-16 MED ORDER — ALLOPURINOL 300 MG PO TABS
300.0000 mg | ORAL_TABLET | Freq: Every day | ORAL | Status: DC
  Administered 2013-09-17 – 2013-09-18 (×2): 300 mg via ORAL
  Filled 2013-09-16 (×2): qty 1

## 2013-09-16 MED ORDER — SODIUM CHLORIDE 0.9 % IJ SOLN
3.0000 mL | Freq: Two times a day (BID) | INTRAMUSCULAR | Status: DC
  Administered 2013-09-16 – 2013-09-17 (×2): 3 mL via INTRAVENOUS

## 2013-09-16 MED ORDER — ASPIRIN EC 81 MG PO TBEC
81.0000 mg | DELAYED_RELEASE_TABLET | Freq: Every day | ORAL | Status: DC
  Administered 2013-09-18: 81 mg via ORAL
  Filled 2013-09-16: qty 1

## 2013-09-16 MED ORDER — NITROGLYCERIN 0.4 MG SL SUBL
0.4000 mg | SUBLINGUAL_TABLET | SUBLINGUAL | Status: DC | PRN
  Administered 2013-09-16 (×2): 0.4 mg via SUBLINGUAL
  Filled 2013-09-16: qty 1

## 2013-09-16 MED ORDER — SODIUM CHLORIDE 0.9 % IV SOLN: 250.0000 mL | INTRAVENOUS | Status: DC | PRN

## 2013-09-16 MED ORDER — SODIUM CHLORIDE 0.9 % IJ SOLN: 3.0000 mL | INTRAMUSCULAR | Status: DC | PRN

## 2013-09-16 MED ORDER — FLUOXETINE HCL 20 MG PO CAPS
40.0000 mg | ORAL_CAPSULE | Freq: Every day | ORAL | Status: DC
  Administered 2013-09-17 – 2013-09-18 (×2): 40 mg via ORAL
  Filled 2013-09-16 (×2): qty 2

## 2013-09-16 MED ORDER — CARVEDILOL 6.25 MG PO TABS
6.2500 mg | ORAL_TABLET | Freq: Two times a day (BID) | ORAL | Status: DC
  Administered 2013-09-16 – 2013-09-18 (×4): 6.25 mg via ORAL
  Filled 2013-09-16 (×6): qty 1

## 2013-09-16 MED ORDER — ATORVASTATIN CALCIUM 80 MG PO TABS
80.0000 mg | ORAL_TABLET | Freq: Every day | ORAL | Status: DC
  Administered 2013-09-16 – 2013-09-17 (×2): 80 mg via ORAL
  Filled 2013-09-16 (×3): qty 1

## 2013-09-16 MED ORDER — HYDROCHLOROTHIAZIDE 25 MG PO TABS
25.0000 mg | ORAL_TABLET | Freq: Every day | ORAL | Status: DC
  Administered 2013-09-18: 25 mg via ORAL
  Filled 2013-09-16 (×2): qty 1

## 2013-09-16 MED ORDER — ALBUTEROL SULFATE (2.5 MG/3ML) 0.083% IN NEBU: 3.0000 mL | INHALATION_SOLUTION | Freq: Four times a day (QID) | RESPIRATORY_TRACT | Status: DC | PRN

## 2013-09-16 MED ORDER — MORPHINE SULFATE 2 MG/ML IJ SOLN: 2.0000 mg | INTRAMUSCULAR | Status: DC | PRN

## 2013-09-16 MED ORDER — HEPARIN SODIUM (PORCINE) 5000 UNIT/ML IJ SOLN
5000.0000 [IU] | Freq: Three times a day (TID) | INTRAMUSCULAR | Status: DC
  Administered 2013-09-16 – 2013-09-17 (×2): 5000 [IU] via SUBCUTANEOUS
  Filled 2013-09-16 (×6): qty 1

## 2013-09-16 MED ORDER — HYDRALAZINE HCL 20 MG/ML IJ SOLN: 10.0000 mg | Freq: Four times a day (QID) | INTRAMUSCULAR | Status: DC | PRN

## 2013-09-16 MED ORDER — OXYCODONE HCL 5 MG PO TABS: 5.0000 mg | ORAL_TABLET | ORAL | Status: DC | PRN

## 2013-09-16 MED ORDER — ASPIRIN 81 MG PO CHEW
81.0000 mg | CHEWABLE_TABLET | ORAL | Status: AC
  Administered 2013-09-17: 81 mg via ORAL
  Filled 2013-09-16: qty 1

## 2013-09-16 MED ORDER — OXYCODONE-ACETAMINOPHEN 5-325 MG PO TABS: 1.0000 | ORAL_TABLET | ORAL | Status: DC | PRN

## 2013-09-16 MED ORDER — OXYCODONE-ACETAMINOPHEN 10-325 MG PO TABS: 1.0000 | ORAL_TABLET | ORAL | Status: DC | PRN

## 2013-09-16 MED ORDER — LISINOPRIL 20 MG PO TABS
20.0000 mg | ORAL_TABLET | Freq: Every day | ORAL | Status: DC
  Administered 2013-09-17 – 2013-09-18 (×2): 20 mg via ORAL
  Filled 2013-09-16 (×2): qty 1

## 2013-09-16 MED ORDER — NITROGLYCERIN 0.4 MG SL SUBL: 0.4000 mg | SUBLINGUAL_TABLET | SUBLINGUAL | Status: DC | PRN

## 2013-09-16 MED ORDER — LISINOPRIL-HYDROCHLOROTHIAZIDE 20-25 MG PO TABS: 1.0000 | ORAL_TABLET | Freq: Every day | ORAL | Status: DC

## 2013-09-16 MED ORDER — ASPIRIN EC 81 MG PO TBEC: 81.0000 mg | DELAYED_RELEASE_TABLET | Freq: Every day | ORAL | Status: DC

## 2013-09-16 NOTE — ED Provider Notes (Signed)
CSN: [id number]     Arrival date & time 09/16/13  0941 History   First MD Initiated Contact with Patient 09/16/13 [phone]     Chief Complaint  Patient presents with  . Chest Pain  . Leg Swelling     (Consider location/radiation/quality/duration/timing/severity/associated sxs/prior Treatment) Patient is a 43 y.o. male presenting with chest pain.  Chest Pain  Pt with several unrelated complaints but chief among them is chest pain onset 2 weeks ago, off and on, No particular provoking or relieving factors. Sharp in nature and associated with SOB. States this morning his wife thought his 'whole left side looked swollen'. He has leg swelling that comes and goes. He also reports headache improved with Excedrin which in turn made him feel jittery.  Past Medical History  Diagnosis Date  . Nephrolithiasis   . Obese   . Hypertension   . Kidney stones    History reviewed. No pertinent past surgical history. Family History  Problem Relation Age of Onset  . Coronary artery disease Mother   . Diabetes Mother   . Stroke Mother    History  Substance Use Topics  . Smoking status: Never Smoker   . Smokeless tobacco: Never Used  . Alcohol Use: No    Review of Systems  Cardiovascular: Positive for chest pain.    All other systems reviewed and are negative except as noted in HPI.    Allergies  Sulfonamide derivatives  Home Medications   Current Outpatient Rx  Name  Route  Sig  Dispense  Refill  . albuterol (PROVENTIL HFA;VENTOLIN HFA) 108 (90 BASE) MCG/ACT inhaler   Inhalation   Inhale 2 puffs into the lungs every 6 (six) hours as needed for wheezing.   1 Inhaler   0   . allopurinol (ZYLOPRIM) 300 MG tablet   Oral   Take 1 tablet (300 mg total) by mouth daily.   90 tablet   0   . aspirin EC 81 MG tablet   Oral   Take 81 mg by mouth daily.         . Cholecalciferol (VITAMIN D PO)   Oral   Take 1 tablet by mouth daily.         . Cranberry-Vitamin C-Probiotic (AZO  CRANBERRY PO)   Oral   Take 2 tablets by mouth daily.         . diclofenac (VOLTAREN) 75 MG EC tablet   Oral   Take 75 mg by mouth 2 (two) times daily.         Marland Kitchen FLUoxetine (PROZAC) 40 MG capsule   Oral   Take 1 capsule (40 mg total) by mouth daily.   90 capsule   0   . furosemide (LASIX) 20 MG tablet   Oral   Take 20 mg by mouth daily as needed. To get rid of some of the fluid on your legs         . lisinopril-hydrochlorothiazide (PRINZIDE,ZESTORETIC) 20-25 MG per tablet   Oral   Take 1 tablet by mouth daily.   90 tablet   3   . Multiple Vitamin (MULTIVITAMIN WITH MINERALS) TABS   Oral   Take 1 tablet by mouth daily.         Marland Kitchen OVER THE COUNTER MEDICATION   Oral   Take 1 tablet by mouth daily. lypozeme         . oxyCODONE-acetaminophen (PERCOCET) 10-325 MG per tablet   Oral   Take 1 tablet by mouth every  4 (four) hours as needed for pain.   50 tablet   0   . traMADol (ULTRAM) 50 MG tablet   Oral   Take 1 tablet (50 mg total) by mouth every 6 (six) hours as needed.   20 tablet   0   . valACYclovir (VALTREX) 1000 MG tablet   Oral   Take 1 tablet (1,000 mg total) by mouth 3 (three) times daily.   30 tablet   0   . Zinc 50 MG TABS   Oral   Take 1 tablet by mouth daily.           BP 165/95  Pulse 106  Temp(Src) 98.1 F (36.7 C) (Oral)  Resp 23  SpO2 95% Physical Exam  Nursing note and vitals reviewed. Constitutional: He is oriented to person, place, and time. He appears well-developed and well-nourished.  Morbidly obese  HENT:  Head: Normocephalic and atraumatic.  Eyes: EOM are normal. Pupils are equal, round, and reactive to light.  Neck: Normal range of motion. Neck supple.  Cardiovascular: Normal rate, normal heart sounds and intact distal pulses.   Pulmonary/Chest: Effort normal and breath sounds normal. He has no wheezes. He has no rales. He exhibits no tenderness.  Abdominal: Bowel sounds are normal. He exhibits no distension. There  is no tenderness.  Musculoskeletal: Normal range of motion. He exhibits edema (trace bilatera). He exhibits no tenderness.  Neurological: He is alert and oriented to person, place, and time. He has normal strength. No cranial nerve deficit or sensory deficit.  Skin: Skin is warm and dry. No rash noted.  Psychiatric: He has a normal mood and affect.    ED Course  Procedures (including critical care time) Labs Review Labs Reviewed  BASIC METABOLIC PANEL - Abnormal; Notable for the following:    Potassium 3.6 (*)    Glucose, Bld 113 (*)    All other components within normal limits  CBC WITH DIFFERENTIAL  TROPONIN I  PRO B NATRIURETIC PEPTIDE   Imaging Review Dg Chest 2 View  09/16/2013   CLINICAL DATA:  Shortness of breath and chest pain  EXAM: CHEST  2 VIEW  COMPARISON:  May 28, 2012  FINDINGS: Lungs are clear. Heart is upper normal in size with normal pulmonary vascularity. No adenopathy. No pneumothorax. No bone lesions.  IMPRESSION: No edema or consolidation.   Electronically Signed   By: Bretta BangWilliam  Woodruff M.D.   On: 09/16/2013 10:29    EKG from MUSE not crossing over into EPIC. Pt has sinus tachycardia at 108, otherwise normal and unchange from previous.    MDM   Final diagnoses:  Chest pain    Pt with CP concerning for ACS. Trop neg. Admit for rule out.    Anel Creighton B. Bernette MayersSheldon, MD 09/16/13 1239

## 2013-09-16 NOTE — ED Notes (Signed)
Pt returned from xray. Back on monitor. Pain is decreased from 5 to 1 after SL nitro

## 2013-09-16 NOTE — H&P (Signed)
Family Medicine Teaching [facility] Admission History and Physical Service Pager: [phone]  Patient name: Justin [name] Medical record number: [id number] Date of birth: January 20, 1971 Age: 43 y.o. Gender: male  Primary Care Provider: Carney Living, MD Consultants: Cardiology Code Status: Full  Chief Complaint: Chest pain  Assessment and Plan: Justin [name] is a 43 y.o. male presenting with chest pain . PMH is significant for hypertension, OSA, obesity, anxiety, gout.  Chest pain, cardiac versus musculoskeletal pain -Typical and atypical features, risk factors include hypertension, obesity, OSA, and family history - Admit to cardiac observation - EKG without ischemic changes, initial troponin negative - Reports negative hospital stress test abou 10 years ago, no cardiologist - Cycle troponins, repeat EKG tomorrow morning, TTE (reporting exertional dyspnea as well) - Risk labs: Lipid panel, A1c, TSH (2011 LDL 117) - And sitting his risk factors we'll go ahead and consult cardiology for possible workup - PRN nitroglycerin, morphine - hold daily NSAID  Hypertension - 144/88 currently - Continue lisinopril, HCTZ  Anxiety - Appears to be a baseline, continue Prozac  OSA - CPAP qHS  Gout - Continue allopurinol  FEN/GI: SLIV, Heart healthy diet Prophylaxis: subq heparin  Disposition: Admit to cardiac obs  History of Present Illness: Justin [name] is a 43 y.o. male presenting with chest pain. Patient states that he's been having it intermittently for the last 2 weeks with a severe episode this morning associated with dyspnea, dizziness, diaphoresis, and left chest/arm swelling which brought him to the ED. He describes his pain as left-sided sharp/pressure like pain, 7/10 at its worst this morning, radiating to his left shoulder, worsened with exertion. He says that the pain completely resolved with nitroglycerin given in ED. The pain has been happening intermittently  over the last 2 weeks, occurring approximately 1-3 times daily and mostly at night. It's exertional but also occurs without exertion. He states that it's associated with diaphoresis. He denies orthopnea, PND, associated nausea or palpitations. He states that he sometimes has sharp L sided chest pain with movement of his L arm but this is inconsistent.   He also states that he had a "severe" headache this morning which relieved with Excedrin. He reports generalized weakness for approximately 2 weeks. He was seen in the clinic about 4 days ago with rectal bleeding due to an anal fissure which has improved. He denies any focal weakness or numbness.  Review Of Systems: Per HPI, Otherwise 12 point review of systems was performed and was unremarkable.  Patient Active Problem List   Diagnosis Date Noted  . Chest pain 09/16/2013  . Rectal fissure 09/12/2013  . Rash and nonspecific skin eruption 07/15/2013  . Dermatitis 07/15/2013  . Rectal bleeding 12/09/2012  . Back pain 12/18/2011  . Heel pain 12/18/2011  . Sleep apnea 08/08/2010  . VENTRAL HERNIA 04/06/2010  . OBESITY 05/24/2009  . ANXIETY STATE NOS 10/19/2008  . GOUT 08/03/2008  . HYPERTENSION, BENIGN 08/03/2008  . Edema 08/03/2008   Past Medical History: Past Medical History  Diagnosis Date  . Nephrolithiasis   . Obese   . Hypertension   . Kidney stones    Past Surgical History: History reviewed. No pertinent past surgical history. Social History: History  Substance Use Topics  . Smoking status: Never Smoker   . Smokeless tobacco: Never Used  . Alcohol Use: No   Additional social history, Please also refer to relevant sections of EMR.  Family History: Family History  Problem Relation Age of Onset  .  Coronary artery disease Mother   . Diabetes Mother   . Stroke Mother    Allergies and Medications: Allergies  Allergen Reactions  . Sulfonamide Derivatives Hives   No current facility-administered medications on file  prior to encounter.   Current Outpatient Prescriptions on File Prior to Encounter  Medication Sig Dispense Refill  . albuterol (PROVENTIL HFA;VENTOLIN HFA) 108 (90 BASE) MCG/ACT inhaler Inhale 2 puffs into the lungs every 6 (six) hours as needed for wheezing.  1 Inhaler  0  . allopurinol (ZYLOPRIM) 300 MG tablet Take 1 tablet (300 mg total) by mouth daily.  90 tablet  0  . aspirin EC 81 MG tablet Take 81 mg by mouth daily.      . Cholecalciferol (VITAMIN D PO) Take 1 tablet by mouth daily.      . Cranberry-Vitamin C-Probiotic (AZO CRANBERRY PO) Take 2 tablets by mouth daily.      . diclofenac (VOLTAREN) 75 MG EC tablet Take 75 mg by mouth 2 (two) times daily.      Marland Kitchen. FLUoxetine (PROZAC) 40 MG capsule Take 1 capsule (40 mg total) by mouth daily.  90 capsule  0  . furosemide (LASIX) 20 MG tablet Take 20 mg by mouth daily as needed. To get rid of some of the fluid on your legs      . lisinopril-hydrochlorothiazide (PRINZIDE,ZESTORETIC) 20-25 MG per tablet Take 1 tablet by mouth daily.  90 tablet  3  . Multiple Vitamin (MULTIVITAMIN WITH MINERALS) TABS Take 1 tablet by mouth daily.      Marland Kitchen. OVER THE COUNTER MEDICATION Take 1 tablet by mouth daily. lypozeme      . oxyCODONE-acetaminophen (PERCOCET) 10-325 MG per tablet Take 1 tablet by mouth every 4 (four) hours as needed for pain.  50 tablet  0  . Zinc 50 MG TABS Take 1 tablet by mouth daily.         Objective: BP 144/88  Pulse 98  Temp(Src) 98.1 F (36.7 C) (Oral)  Resp 26  SpO2 98% Exam: Gen: NAD, alert, cooperative with exam, morbidly obese, sits up in bed with lots of effort HEENT: NCAT, EOMI, PERRL, MMM CV: RRR, good S1/S2, no murmur, distant heart sounds, no reproducible pain with palpation Resp: CTABL, no wheezes, non-labored Abd: SNTND, BS present, no guarding or organomegaly Ext: No edema, 2 + DP pulses BL Neuro: Alert and oriented, 5/5 and sensation intact in all 4 extremities, tremor flexion on left lower extremity.   Labs and  Imaging: CBC BMET   Recent Labs Lab 09/16/13 1005  WBC 9.2  HGB 15.1  HCT 44.4  PLT 285    Recent Labs Lab 09/16/13 1005  NA 142  K 3.6*  CL 100  CO2 25  BUN 16  CREATININE 0.90  GLUCOSE 113*  CALCIUM 9.6       Recent Labs Lab 09/16/13 1003  TROPONINI <0.30   ProBNP 23.6  CXR 09/16/2013 IMPRESSION:  No edema or consolidation.  EKG 09/16/2013: Sinus tachycardia  Elenora GammaSamuel L Bradshaw, MD 09/16/2013, 1:01 PM PGY-2, [facility] FPTS Intern pager: [phone], text pages welcome

## 2013-09-16 NOTE — ED Notes (Signed)
Patient transported to X-ray 

## 2013-09-16 NOTE — ED Notes (Signed)
Attempt to call report nurse not available 

## 2013-09-16 NOTE — ED Notes (Addendum)
PT SOB; chest tightness and pain starting on again off again for weeks; past couple days tightness. Migraine headache that he took Excedrin for it.

## 2013-09-16 NOTE — ED Notes (Signed)
Pt has multiple complaints. Reports intermittent chest pains for over 2 weeks. Now reports swelling to left side of body, tremors, dizziness, chest tightness and leg cramps. Ambulatory at triage, ekg done.

## 2013-09-16 NOTE — ED Notes (Signed)
Admitting MD at bedside.

## 2013-09-16 NOTE — H&P (Signed)
CArdiologist:  Justin [name] is an 43 y.o. male.   Chief Complaint:   Chest Pain. HPI: Past medical history significant for morbid obesity, HTN, OSA and gout with 2 weeks of intermittent chest pain located on left side of chest, pressure-like and radiation to left shoulder. Pain with and without association to exertion, and some times woke him up at night. Episodes happened one to twice a day and lasted for 1-2 min with spontaneous resolution. This morning pain was at its worse, pt also had headache, felt lightheaded, diaphoretic and with mild dyspnea. No palpitations, syncope or presyncope.   He was given SL NTG in the ER and it relieved the pain.    Strong family history of cardiac problems including mother with heart attack at age 41.  Recently seen for low GI bleed due to possible rectal fisure, treated and resolved with stool softeners regimen.  The patient currently denies nausea, vomiting, fever, orthopnea, dizziness, PND, cough, congestion, abdominal pain,  melena, lower extremity edema, claudication.   Past Medical History  Diagnosis Date  . Nephrolithiasis   . Obese   . Hypertension   . Kidney stones     History reviewed. No pertinent past surgical history.  Family History  Problem Relation Age of Onset  . Coronary artery disease Mother   . Diabetes Mother   . Stroke Mother    Social History:  reports that he has never smoked. He has never used smokeless tobacco. He reports that he does not drink alcohol or use illicit drugs.  Allergies:  Allergies  Allergen Reactions  . Sulfonamide Derivatives Hives    Medications Prior to Admission  Medication Sig Dispense Refill  . albuterol (PROVENTIL HFA;VENTOLIN HFA) 108 (90 BASE) MCG/ACT inhaler Inhale 2 puffs into the lungs every 6 (six) hours as needed for wheezing.  1 Inhaler  0  . allopurinol (ZYLOPRIM) 300 MG tablet Take 1 tablet (300 mg total) by mouth daily.  90 tablet  0  . aspirin EC 81 MG tablet Take 81 mg  by mouth daily.      . Cholecalciferol (VITAMIN D PO) Take 1 tablet by mouth daily.      . Cranberry-Vitamin C-Probiotic (AZO CRANBERRY PO) Take 2 tablets by mouth daily.      . diclofenac (VOLTAREN) 75 MG EC tablet Take 75 mg by mouth 2 (two) times daily.      Marland Kitchen FLUoxetine (PROZAC) 40 MG capsule Take 1 capsule (40 mg total) by mouth daily.  90 capsule  0  . furosemide (LASIX) 20 MG tablet Take 20 mg by mouth daily as needed. To get rid of some of the fluid on your legs      . lisinopril-hydrochlorothiazide (PRINZIDE,ZESTORETIC) 20-25 MG per tablet Take 1 tablet by mouth daily.  90 tablet  3  . Multiple Vitamin (MULTIVITAMIN WITH MINERALS) TABS Take 1 tablet by mouth daily.      Marland Kitchen OVER THE COUNTER MEDICATION Take 1 tablet by mouth daily. lypozeme      . oxyCODONE-acetaminophen (PERCOCET) 10-325 MG per tablet Take 1 tablet by mouth every 4 (four) hours as needed for pain.  50 tablet  0  . Zinc 50 MG TABS Take 1 tablet by mouth daily.       . methylPREDNIsolone (MEDROL DOSPACK) 4 MG tablet Take 4 mg by mouth as directed. For 6 days        Results for orders placed during the hospital encounter of 09/16/13 (from the past 48  hour(s))  TROPONIN I     Status: None   Collection Time    09/16/13 10:03 AM      Result Value Ref Range   Troponin I <0.30  <0.30 ng/mL   Comment:            Due to the release kinetics of cTnI,     a negative result within the first hours     of the onset of symptoms does not rule out     myocardial infarction with certainty.     If myocardial infarction is still suspected,     repeat the test at appropriate intervals.  PRO B NATRIURETIC PEPTIDE     Status: None   Collection Time    09/16/13 10:03 AM      Result Value Ref Range   Pro B Natriuretic peptide (BNP) 23.6  0 - 125 pg/mL  CBC WITH DIFFERENTIAL     Status: None   Collection Time    09/16/13 10:05 AM      Result Value Ref Range   WBC 9.2  4.0 - 10.5 K/uL   RBC 4.75  4.22 - 5.81 MIL/uL   Hemoglobin 15.1   13.0 - 17.0 g/dL   HCT 44.4  39.0 - 52.0 %   MCV 93.5  78.0 - 100.0 fL   MCH 31.8  26.0 - 34.0 pg   MCHC 34.0  30.0 - 36.0 g/dL   RDW 14.5  11.5 - 15.5 %   Platelets 285  150 - 400 K/uL   Neutrophils Relative % 57  43 - 77 %   Neutro Abs 5.2  1.7 - 7.7 K/uL   Lymphocytes Relative 36  12 - 46 %   Lymphs Abs 3.3  0.7 - 4.0 K/uL   Monocytes Relative 4  3 - 12 %   Monocytes Absolute 0.4  0.1 - 1.0 K/uL   Eosinophils Relative 2  0 - 5 %   Eosinophils Absolute 0.2  0.0 - 0.7 K/uL   Basophils Relative 1  0 - 1 %   Basophils Absolute 0.1  0.0 - 0.1 K/uL  BASIC METABOLIC PANEL     Status: Abnormal   Collection Time    09/16/13 10:05 AM      Result Value Ref Range   Sodium 142  137 - 147 mEq/L   Potassium 3.6 (*) 3.7 - 5.3 mEq/L   Chloride 100  96 - 112 mEq/L   CO2 25  19 - 32 mEq/L   Glucose, Bld 113 (*) 70 - 99 mg/dL   BUN 16  6 - 23 mg/dL   Creatinine, Ser 0.90  0.50 - 1.35 mg/dL   Calcium 9.6  8.4 - 10.5 mg/dL   GFR calc non Af Amer >90  >90 mL/min   GFR calc Af Amer >90  >90 mL/min   Comment: (NOTE)     The eGFR has been calculated using the CKD EPI equation.     This calculation has not been validated in all clinical situations.     eGFR's persistently <90 mL/min signify possible Chronic Kidney     Disease.   Dg Chest 2 View  09/16/2013   CLINICAL DATA:  Shortness of breath and chest pain  EXAM: CHEST  2 VIEW  COMPARISON:  May 28, 2012  FINDINGS: Lungs are clear. Heart is upper normal in size with normal pulmonary vascularity. No adenopathy. No pneumothorax. No bone lesions.  IMPRESSION: No edema or consolidation.   Electronically  Signed   By: Lowella Grip M.D.   On: 09/16/2013 10:29    Review of Systems  Constitutional: Positive for diaphoresis. Negative for fever.  Respiratory: Positive for shortness of breath.   Cardiovascular: Positive for chest pain. Negative for orthopnea, leg swelling and PND.  Gastrointestinal: Negative for nausea, vomiting, abdominal pain,  blood in stool (None recently) and melena.  Genitourinary: Negative.   Musculoskeletal: Positive for joint pain.  Neurological: Positive for dizziness and headaches.  Psychiatric/Behavioral: Positive for depression.  All other systems reviewed and are negative.    Blood pressure 192/84, pulse 103, temperature 98.2 F (36.8 C), temperature source Oral, resp. rate 22, height 5' 9" (1.753 m), weight 423 lb (191.872 kg), SpO2 97.00%. Physical Exam  Nursing note and vitals reviewed. Constitutional: He is oriented to person, place, and time. He appears well-developed and well-nourished. No distress.  HENT:  Head: Normocephalic and atraumatic.  Eyes: Conjunctivae are normal. Pupils are equal, round, and reactive to light. No scleral icterus.  Neck: Neck supple. No JVD present.  Cardiovascular: Normal rate, regular rhythm and normal heart sounds.  Exam reveals no gallop and no friction rub.   No murmur heard. Respiratory: Effort normal and breath sounds normal.  GI: Soft. Bowel sounds are normal. There is no tenderness.  Musculoskeletal: He exhibits no edema.  Lymphadenopathy:    He has no cervical adenopathy.  Neurological: He is alert and oriented to person, place, and time. He exhibits normal muscle tone.  Skin: Skin is warm and dry.  Psychiatric: He has a normal mood and affect.     Assessment/Plan 1. Chest pain: EKG with sinus tachycardia otherwise unremarkable. Troponin X1 negative. Continue cycling troponin and telemetry monitoring. ECHO in process.  Due to pt habitus and strong history as well as FHx cath is the indicated study.  2. HTN: on lisinopril, HCTZ. Uncontrolled. Will start Coreg 6.25 BID and add Hydralazine 35m IV PRN with parameters.  3. Hypertriglyceridemia: lipid profile in process.  4. OSA; on CPAP QHS continue  5. GOUT: per primary team.  6. Depression: per primary team.   HTarri Fuller3/31/2015, 2:33 PM  Attending Note:   The patient was seen and examined.   Agree with assessment and plan as noted above.  Changes made to the above note as needed.  Pt presents with several weeks of intermittent chest pain. He had a severe episode of chest discomfort this morning. The chest discomfort was left-sided and was an ache. There was some radiation down his arm. It was associated with shortness of breath. He presented to the emergency room and his pain was relieved fairly promptly with 2 sublingual nitroglycerin.  Because of his body size he's not a candidate for stress Myoview study. I agree with doing a cardiac catheterization. We discussed the risks, benefits, and options of cardiac catheterization. He understands and agrees to proceed.  We had long discussion about changing his lifestyle. He needs to exercise more he needs to or for exam his dietary intake. I suggested that he join [employer] or any of the other weight loss support groups. We'll anticipate doing a cardiac cath tomorrow from the right radial artery  PRamond Dial, MD, [facility]3/31/2015, 3:50 PM

## 2013-09-16 NOTE — Progress Notes (Signed)
  Echocardiogram 2D Echocardiogram has been performed.  Justin [name] FRANCES 09/16/2013, 3:20 PM

## 2013-09-16 NOTE — H&P (Signed)
Attending Addendum  I examined the patient and discussed the assessment and plan with Dr. Ermalinda MemosBradshaw. I have reviewed the note and agree.  Briefly, 43 yo M with morbid obesity admitted for L sided chest pain for ACS r/o. Pain has been intermittent over past two weeks. Severe this Am associated with nausea, SOB and diaphoresis. Patient is a non-smoker, mother with MI at age 41 (smoker), admits to increased work related stress over same time period. Video games usually relieve stress, not now. No ETOH or IDU.   Admits to trace b/l LE edema during the day that improves at night.   Pain free and SOB free now.   BP 192/84  Pulse 103  Temp(Src) 98.2 F (36.8 C) (Oral)  Resp 22  Ht 5\' 9"  (1.753 m)  Wt 423 lb (191.872 kg)  BMI 62.44 kg/m2  SpO2 97% General appearance: alert, cooperative, no distress and morbidly obese Lungs: clear to auscultation bilaterally Heart: regular rate and rhythm, S1, S2 normal, no murmur, click, rub or gallop Extremities: edema trace b/l  A/P: 43 yo M with atypical CP. Admit for r/o. Cards has seen and plans for cath in AM.  Pain free Cycle trops D-dimer ordered, low PE, pretest probability.      Dessa PhiFUNCHES,Trevontae Lindahl, MD FAMILY MEDICINE TEACHING SERVICE

## 2013-09-17 ENCOUNTER — Other Ambulatory Visit: Payer: Self-pay

## 2013-09-17 ENCOUNTER — Encounter (HOSPITAL_COMMUNITY)
Admission: EM | Disposition: A | Discharge status: Home / Self Care | Payer: No Typology Code available for payment source | Source: Home / Self Care | Attending: Emergency Medicine

## 2013-09-17 DIAGNOSIS — I10 Essential (primary) hypertension: Secondary | ICD-10-CM

## 2013-09-17 DIAGNOSIS — F411 Generalized anxiety disorder: Secondary | ICD-10-CM

## 2013-09-17 DIAGNOSIS — R079 Chest pain, unspecified: Secondary | ICD-10-CM

## 2013-09-17 HISTORY — PX: LEFT HEART CATHETERIZATION WITH CORONARY ANGIOGRAM: SHX5451

## 2013-09-17 LAB — LIPID PANEL
Cholesterol: 221 mg/dL — ABNORMAL HIGH (ref 0–200)
HDL: 36 mg/dL — ABNORMAL LOW (ref >39)
LDL Cholesterol: 133 mg/dL — ABNORMAL HIGH (ref 0–99)
Total CHOL/HDL Ratio: 6.1 RATIO
Triglycerides: 259 mg/dL — ABNORMAL HIGH (ref <150)
VLDL: 52 mg/dL — ABNORMAL HIGH (ref 0–40)

## 2013-09-17 LAB — BASIC METABOLIC PANEL
BUN: 18 mg/dL (ref 6–23)
CHLORIDE: 99 meq/L (ref 96–112)
CO2: 26 mEq/L (ref 19–32)
CREATININE: 0.94 mg/dL (ref 0.50–1.35)
Calcium: 9.5 mg/dL (ref 8.4–10.5)
GFR calc non Af Amer: >90 mL/min (ref >90)
GLUCOSE: 98 mg/dL (ref 70–99)
Potassium: 4.4 mEq/L (ref 3.7–5.3)
Sodium: 140 mEq/L (ref 137–147)

## 2013-09-17 LAB — CBC
HEMATOCRIT: 44.7 % (ref 39.0–52.0)
HEMOGLOBIN: 15 g/dL (ref 13.0–17.0)
MCH: 31.4 pg (ref 26.0–34.0)
MCHC: 33.6 g/dL (ref 30.0–36.0)
MCV: 93.7 fL (ref 78.0–100.0)
Platelets: 292 10*3/uL (ref 150–400)
RBC: 4.77 MIL/uL (ref 4.22–5.81)
RDW: 14.6 % (ref 11.5–15.5)
WBC: 9.8 10*3/uL (ref 4.0–10.5)

## 2013-09-17 LAB — PROTIME-INR
INR: 0.98 (ref 0.00–1.49)
Prothrombin Time: 12.8 seconds (ref 11.6–15.2)

## 2013-09-17 SURGERY — LEFT HEART CATHETERIZATION WITH CORONARY ANGIOGRAM
Anesthesia: LOCAL

## 2013-09-17 MED ORDER — HEPARIN (PORCINE) IN NACL 2-0.9 UNIT/ML-% IJ SOLN
INTRAMUSCULAR | Status: AC
  Filled 2013-09-17: qty 1000

## 2013-09-17 MED ORDER — VERAPAMIL HCL 2.5 MG/ML IV SOLN
INTRAVENOUS | Status: AC
  Filled 2013-09-17: qty 2

## 2013-09-17 MED ORDER — LIDOCAINE HCL (PF) 1 % IJ SOLN
INTRAMUSCULAR | Status: AC
  Filled 2013-09-17: qty 30

## 2013-09-17 MED ORDER — SODIUM CHLORIDE 0.9 % IV SOLN
1.0000 mL/kg/h | INTRAVENOUS | Status: AC
  Administered 2013-09-17: 1 mL/kg/h via INTRAVENOUS

## 2013-09-17 MED ORDER — HEPARIN SODIUM (PORCINE) 1000 UNIT/ML IJ SOLN
INTRAMUSCULAR | Status: AC
  Filled 2013-09-17: qty 1

## 2013-09-17 MED ORDER — LORAZEPAM 1 MG PO TABS
2.0000 mg | ORAL_TABLET | Freq: Once | ORAL | Status: DC
  Filled 2013-09-17: qty 2

## 2013-09-17 MED ORDER — FENTANYL CITRATE 0.05 MG/ML IJ SOLN
INTRAMUSCULAR | Status: AC
  Filled 2013-09-17: qty 2

## 2013-09-17 MED ORDER — MIDAZOLAM HCL 2 MG/2ML IJ SOLN
INTRAMUSCULAR | Status: AC
  Filled 2013-09-17: qty 2

## 2013-09-17 MED ORDER — SODIUM CHLORIDE 0.9 % IV SOLN
INTRAVENOUS | Status: DC
  Administered 2013-09-17: 08:00:00 via INTRAVENOUS

## 2013-09-17 MED ORDER — LORAZEPAM 1 MG PO TABS
2.0000 mg | ORAL_TABLET | Freq: Once | ORAL | Status: AC
  Administered 2013-09-17: 2 mg via ORAL
  Filled 2013-09-17: qty 2

## 2013-09-17 MED ORDER — NITROGLYCERIN 0.2 MG/ML ON CALL CATH LAB
INTRAVENOUS | Status: AC
  Filled 2013-09-17: qty 1

## 2013-09-17 NOTE — Progress Notes (Signed)
Attending Addendum  I examined the patient and discussed the assessment and plan with Dr. Michail JewelsMarsh. I have reviewed the note and agree.  Patient still nervous in anticipation of cath between 12-3 PM. Agreeable to ativan x one dose. 2 mg ordered. No repeat CP. Will f/u cath results to determine dispo.  Updated patient and wife with plan.     Dessa PhiFUNCHES,Diannie Willner, MD FAMILY MEDICINE TEACHING SERVICE

## 2013-09-17 NOTE — Progress Notes (Signed)
Placed patient on CPAP for the night via auto-mode with minimum pressure set at 6cm and maximum pressure set at 20cm  

## 2013-09-17 NOTE — Progress Notes (Signed)
Patient placed on CPAP for the night. Auto mode min of 6 max of 20. Tolerating well at this time

## 2013-09-17 NOTE — Progress Notes (Signed)
PGY-2 Interim Note  Met with pt briefly post-cath; tolerated procedure well, no current chest pain. Relieved to hear no obvious CAD noted, states he realizes he needs to make changes to his diet, activity level, etc. Radial seal in place. Pt still anxious about having it removed too quickly, having bleeding afterwards, etc. Plan to monitor overnight with DC likely in the AM. Otherwise continue per previous notes.  Emmaline Kluver, MD PGY-2, [facility] 09/17/2013, 6:08 PM [place] [place] Service pager: [phone] (text pages welcome through [facility])

## 2013-09-17 NOTE — Interval H&P Note (Signed)
History and Physical Interval Note:  09/17/2013 3:53 PM  Alphia MohWilliam C Verastegui  has presented today for surgery, with the diagnosis of CP  The various methods of treatment have been discussed with the patient and family. After consideration of risks, benefits and other options for treatment, the patient has consented to  Procedure(s): LEFT HEART CATHETERIZATION WITH CORONARY ANGIOGRAM (N/A) as a surgical intervention .  The patient's history has been reviewed, patient examined, no change in status, stable for surgery.  I have reviewed the patient's chart and labs.  Questions were answered to the patient's satisfaction.   Cath Lab Visit (complete for each Cath Lab visit)  Clinical Evaluation Leading to the Procedure:   ACS: yes  Non-ACS:    Anginal Classification: CCS III   Anti-ischemic medical therapy: No Therapy  Non-Invasive Test Results: No non-invasive testing performed  Prior CABG: No previous CABG        Theron Aristaeter [facility]MD,FACC 09/17/2013 3:53 PM

## 2013-09-17 NOTE — Progress Notes (Signed)
Family Medicine Teaching Service Daily Progress Note Intern Pager: [phone]  Patient name: Justin MohWilliam C Mena Medical record number: [id number] Date of birth: 1971-04-26 Age: 43 y.o. Gender: male  Primary Care Provider: Carney LivingHAMBLISS,MARSHALL L, MD Consultants: Cards Code Status: Full  Pt Overview and Major Events to Date:  4/1 Cath  Assessment and Plan: Justin [name] is a 43 y.o. male presenting with chest pain . PMH is significant for hypertension, OSA, obesity, anxiety, gout.   #Chest pain, cardiac versus musculoskeletal pain; Typical and atypical features, risk factors include hypertension, obesity, OSA, and family history; self reported neg stress 10 yrs ago but does not follow with cards - CARDS plans for cath this morning  - EKG without ischemic changes, trops neg X2 - Cycled troponins neg, repeat EKG this morning with increased R/S ratio in V1 (appears different from previous EKG yesterday) - TTE (reporting exertional dyspnea as well)- with nml EF and mod concentric hypertrophy (diastolic not assessed) - Risk labs: Lipid panel, A1c, TSH (LDL 133, A1C 5.7, TSH 1.7) - ator 80, coreg 6.25mg  - PRN nitroglycerin, morphine - hold daily NSAID   #Hypertension: at goal - Continue lisinopril, HCTZ  - hydralazine prn  #Anxiety: Appears to be acutely worsened with upcoming test -continue Prozac   #OSA  - CPAP qHS   #Gout  - Continue allopurinol   FEN/GI: SLIV, Heart healthy diet  Prophylaxis: subq heparin   Disposition: completion of cardiac workup/plan for cath today  Subjective: Very nervous and anxious about cath today; otherwise in good spirits, no SOB, palps, nausea vomiting or diaphoresis  Objective: Temp:  [97.8 F (36.6 C)-98.2 F (36.8 C)] 97.8 F (36.6 C) (04/01 0400) Pulse Rate:  [73-109] 81 (04/01 0400) Resp:  [17-32] 22 (04/01 0400) BP: (105-192)/(50-95) 139/74 mmHg (04/01 0400) SpO2:  [92 %-100 %] 100 % (04/01 0400) Weight:  [191.455 kg (422 lb 1.3  oz)-191.872 kg (423 lb)] 191.455 kg (422 lb 1.3 oz) (04/01 0000) Physical Exam: Gen: NAD, alert, cooperative with exam, morbidly obese, sits up in bed with lots of effort, anxious affect HEENT: NCAT, EOMI, PERRL, MMM  CV: RRR, good S1/S2, no murmur, distant heart sounds (very difficult to appreciate), no reproducible pain with palpation  Resp: CTABL, no wheezes, non-labored  Abd: SNTND, BS present, no guarding or organomegaly  Ext: No edema, 2 + DP pulses BL  Neuro: Alert and oriented, 5/5 and sensation intact in all 4 extremities, tremor of extension of extremities (difficult to tell whether this is 2/2 anxiety or has baseline tremor) Skin: chronic overlying skin changes on left lower extremity (appear consistent with CVS)  Laboratory:  Recent Labs Lab 09/12/13 1426 09/16/13 1005 09/17/13 0517  WBC  --  9.2 9.8  HGB 14.0* 15.1 15.0  HCT  --  44.4 44.7  PLT  --  285 292    Recent Labs Lab 09/16/13 1005 09/17/13 0517  NA 142 140  K 3.6* 4.4  CL 100 99  CO2 25 26  BUN 16 18  CREATININE 0.90 0.94  CALCIUM 9.6 9.5  GLUCOSE 113* 98    Trops neg X2 ProBNP 23.6  CXR 09/16/2013  IMPRESSION:  No edema or consolidation  EKG 09/16/2013: Sinus tachycardia    Anselm LisMelanie Ilea Hilton, MD 09/17/2013, 7:26 AM PGY-1, [facility] FPTS Intern pager: [phone], text pages welcome

## 2013-09-17 NOTE — Progress Notes (Signed)
Patient ID: Justin [name], male   DOB: 10/09/1970, 43 y.o.   MRN: [id number]  The patient is for cath today.  Jerral BonitoJeff Kierria Feigenbaum, MD

## 2013-09-17 NOTE — CV Procedure (Signed)
    Cardiac Catheterization Procedure Note  Name: Justin [name] MRN: [id number] DOB: 12-Mar-1971  Procedure: Left Heart Cath, Selective Coronary Angiography, LV angiography  Indication: 43 yo WM with history of HTN, dyslipidemia, morbid obesity and family history of CAD presents with symptoms of chest pain.   Procedural Details: The right wrist was prepped, draped, and anesthetized with 1% lidocaine. Using the modified Seldinger technique, a 5 French sheath was introduced into the right radial artery. 3 mg of verapamil was administered through the sheath, weight-based unfractionated heparin was administered intravenously. Standard Judkins catheters were used for selective coronary angiography and left ventriculography. Catheter exchanges were performed over an exchange length guidewire. There were no immediate procedural complications. A TR band was used for radial hemostasis at the completion of the procedure.  The patient was transferred to the post catheterization recovery area for further monitoring.  Procedural Findings: Hemodynamics: AO 112/75 mean 92 mm Hg LV 117/12 mm Hg  Coronary angiography: Coronary dominance: right  Left mainstem: Normal.   Left anterior descending (LAD): Normal.  Left circumflex (LCx): Normal.   Right coronary artery (RCA): Normal.  Left ventriculography: Left ventricular systolic function is normal, LVEF is estimated at 55-65%, there is no significant mitral regurgitation   Final Conclusions:   1. Normal coronary anatomy 2. Normal LV function.   Recommendations: Patient stable for discharge once TR band removed.  No further cardiac work up needed.   Theron Aristaeter [facility]MD,FACC 09/17/2013, 4:23 PM

## 2013-09-18 ENCOUNTER — Ambulatory Visit: Payer: No Typology Code available for payment source | Admitting: Podiatry

## 2013-09-18 ENCOUNTER — Other Ambulatory Visit: Payer: Self-pay

## 2013-09-18 DIAGNOSIS — M109 Gout, unspecified: Secondary | ICD-10-CM

## 2013-09-18 MED ORDER — NITROGLYCERIN 0.4 MG SL SUBL: 0.4000 mg | SUBLINGUAL_TABLET | SUBLINGUAL | Status: DC | PRN

## 2013-09-18 MED ORDER — ATORVASTATIN CALCIUM 80 MG PO TABS: 80.0000 mg | ORAL_TABLET | Freq: Every day | ORAL | Status: DC

## 2013-09-18 MED ORDER — CARVEDILOL 6.25 MG PO TABS: 6.2500 mg | ORAL_TABLET | Freq: Two times a day (BID) | ORAL | Status: DC

## 2013-09-18 NOTE — Discharge Summary (Signed)
Attending Addendum  I examined the patient and discussed the discharge plan with Dr. Marsh. I have reviewed the note and agree.    Shekia Kuper, MD FAMILY MEDICINE TEACHING SERVICE  

## 2013-09-18 NOTE — Progress Notes (Signed)
Patient ID: Justin [name], male   DOB: 16-Jun-1971, 43 y.o.   MRN: [id number]  Patient is stable post catheterization. Radial cath site looks good. Patient can be discharged from the cardiology viewpoint.  Jerral BonitoJeff Cheyann Blecha, MD

## 2013-09-18 NOTE — Discharge Summary (Signed)
[facility]  Patient name: Justin [name] Medical record number: [id number] Date of birth: 25-Aug-1970 Age: 43 y.o. Gender: male Date of Admission: 09/16/2013  Date of Discharge: 09/18/13 Admitting Physician: Lora PaulaJosalyn C Funches, MD  Primary Care Provider: Carney LivingHAMBLISS,MARSHALL L, MD Consultants: Cards  Indication for Hospitalization: Chest pain  Discharge Diagnoses/Problem List:  Patient Active Problem List   Diagnosis Date Noted  . Chest pain 09/16/2013  . Sleep apnea 08/08/2010  . OBESITY 05/24/2009  . ANXIETY STATE NOS 10/19/2008  . GOUT 08/03/2008  . HYPERTENSION, BENIGN 08/03/2008  . Edema 08/03/2008   Disposition: home  Discharge Condition: improved  Discharge Exam:  BP 116/58  Pulse 80  Temp(Src) 98 F (36.7 C) (Oral)  Resp 22  Ht 5\' 9"  (1.753 m)  Wt 422 lb 1.3 oz (191.455 kg)  BMI 62.30 kg/m2  SpO2 95% Gen: NAD, alert, cooperative with exam, morbidly obese, sits up in bed with lots of effort, anxious affect  HEENT: NCAT, EOMI, PERRL, MMM  CV: RRR, good S1/S2, no murmur, distant heart sounds (very difficult to appreciate), no reproducible pain with palpation  Resp: CTABL, no wheezes, non-labored  Abd: SNTND, BS present, no guarding or organomegaly  Ext: No edema, 2 + DP pulses BL  Neuro: Alert and oriented, 5/5 and sensation intact in all 4 extremities, tremor of extension of extremities (difficult to tell whether this is 2/2 anxiety or has baseline tremor)  Skin: chronic overlying skin changes on left lower extremity (appear consistent with CVS)   Brief Hospital Course:  Justin [name] is a 43 y.o. male presenting with chest pain intermittently for the last 2 weeks with severe episode on day of presentation assc with dyspnea, dizziness, diaphoresis; worse with exertion radiating to left shoulder. Pain completely relieved with nitro in the ED. PMH is significant for hypertension, OSA, obesity, anxiety, gout.   #Chest  pain, cardiac versus musculoskeletal pain; History with both typical and atypical features, risk factors include hypertension, obesity (422lbs), OSA, and family history (reports mother with MI around the age of 41); Self reported neg stress 10 yrs ago but does not follow with cards. Ekg without ischemic changes, trops cycled and were negative. TEE with nml EF, moderate concentric hypertrophy (diastolic not assessed). Cards consulted, was not a candidate for stress myoview 2/2 body size. Cath performed 4/2, without any CAD or obstruction. Risk labs: Lipid panel, A1c, TSH (LDL 133, A1C 5.7, TSH 1.7). Pt started on ator 80, coreg 6.25mg . Was continued on ASA, lisinopril, hctz and prn nitro. Pt aware for need to lose weight.   #Hypertension: At goal throughout admission. Was continued on lisinopril, HCTZ. Did not require any hydralazine prn   #Anxiety: Was continued on home prozac. Appeared to be acutely worsened with upcoming test. Given ativan. Would f/up as outpt  #OSA CPAP qHS. Was instructed on importance of continuing to use nightly.   #Gout  Continued allopurinol   Issues for Follow Up:  1. Risk modification for CAD 2. Weight loss; lifestyle change, increased physical activity, decrease dietary intake; coordination of joining weight watchers 3. Compliance with nightly CPAP  Significant Procedures:  Cardiac Cath 4/2 Final Conclusions:  1. Normal coronary anatomy  2. Normal LV function  Significant Labs and Imaging:   Recent Labs Lab 09/12/13 1426 09/16/13 1005 09/17/13 0517  WBC  --  9.2 9.8  HGB 14.0* 15.1 15.0  HCT  --  44.4 44.7  PLT  --  285 292  Recent Labs Lab 09/16/13 1005 09/17/13 0517  NA 142 140  K 3.6* 4.4  CL 100 99  CO2 25 26  GLUCOSE 113* 98  BUN 16 18  CREATININE 0.90 0.94  CALCIUM 9.6 9.5   EKG nml Trops neg  Results/Tests Pending at Time of Discharge: none  Discharge Medications:    Medication List    STOP taking these medications        methylPREDNIsolone 4 MG tablet  Commonly known as:  MEDROL DOSPACK      TAKE these medications       albuterol 108 (90 BASE) MCG/ACT inhaler  Commonly known as:  PROVENTIL HFA;VENTOLIN HFA  Inhale 2 puffs into the lungs every 6 (six) hours as needed for wheezing.     allopurinol 300 MG tablet  Commonly known as:  ZYLOPRIM  Take 1 tablet (300 mg total) by mouth daily.     aspirin EC 81 MG tablet  Take 81 mg by mouth daily.     atorvastatin 80 MG tablet  Commonly known as:  LIPITOR  Take 1 tablet (80 mg total) by mouth daily at 6 PM.     AZO CRANBERRY PO  Take 2 tablets by mouth daily.     carvedilol 6.25 MG tablet  Commonly known as:  COREG  Take 1 tablet (6.25 mg total) by mouth 2 (two) times daily with a meal.     diclofenac 75 MG EC tablet  Commonly known as:  VOLTAREN  Take 75 mg by mouth 2 (two) times daily.     FLUoxetine 40 MG capsule  Commonly known as:  PROZAC  Take 1 capsule (40 mg total) by mouth daily.     furosemide 20 MG tablet  Commonly known as:  LASIX  Take 20 mg by mouth daily as needed. To get rid of some of the fluid on your legs     lisinopril-hydrochlorothiazide 20-25 MG per tablet  Commonly known as:  PRINZIDE,ZESTORETIC  Take 1 tablet by mouth daily.     multivitamin with minerals Tabs tablet  Take 1 tablet by mouth daily.     nitroGLYCERIN 0.4 MG SL tablet  Commonly known as:  NITROSTAT  Place 1 tablet (0.4 mg total) under the tongue every 5 (five) minutes as needed for chest pain. Call if no relief     OVER THE COUNTER MEDICATION  Take 1 tablet by mouth daily. lypozeme     oxyCODONE-acetaminophen 10-325 MG per tablet  Commonly known as:  PERCOCET  Take 1 tablet by mouth every 4 (four) hours as needed for pain.     VITAMIN D PO  Take 1 tablet by mouth daily.     Zinc 50 MG Tabs  Take 1 tablet by mouth daily.        Discharge Instructions: Please refer to Patient Instructions section of EMR for full details.  Patient was  counseled important signs and symptoms that should prompt return to medical care, changes in medications, dietary instructions, activity restrictions, and follow up appointments.   Follow-Up Appointments: Follow-up Information   Follow up with Lora Paula, MD On 09/22/2013. (@11am )    Specialty:  Family Medicine   Contact information:   [address] [place] [address] [phone]       Anselm Lis, MD 09/18/2013, 9:41 AM PGY-1, [facility]

## 2013-09-18 NOTE — Discharge Instructions (Addendum)
Mr Justin [name], you were seen in the hospital for chest pain. We obtained EKGs and looked at your cardiac enzymes to assess whether your heart was getting enough blood flow. We also obtained an echocardiogram to make sure your heart was pumping blood in the right way. We were pleased to see that everything looked normal. You had a cath done to assess the blood flow in your coronary arteries which showed no blockage. The best thing that you can do going forward is modifying your risk factors to prevent something like this happening again, as we discussed. Make sure you follow up with your PCP to routinely check your blood pressure, thyroid function, screen for diabetes and monitor your cholesterol and help you lose weight.   HOME CARE INSTRUCTIONS   Avoid smoking.  Take one baby or adult aspirin daily, if your caregiver advises. This helps reduce the risk of a heart attack.  It is very important that you follow the angina treatment prescribed by your caregiver. Make arrangements for proper follow-up care.  Eat a heart healthy diet with salt and fat restrictions as advised.  Regular exercise is good for you as long as it does not cause discomfort. Do not begin any new type of exercise until you check with your caregiver.  Try to maintain normal blood lipid levels.  Keep your blood pressure under control as recommended by your caregiver.  You should tell your caregiver right away about any increase in the severity or frequency of your chest discomfort or angina attacks. When you have angina, you should stop what you are doing and sit down. This may bring relief in 3 to 5 minutes. If your caregiver has prescribed nitro, take it as directed.  If your caregiver has given you a follow-up appointment, it is very important to keep that appointment. Not keeping the appointment could result in a chronic or permanent injury, pain, and disability. If there is any problem keeping the appointment, you must call back  to this facility for assistance. SEEK IMMEDIATE MEDICAL CARE IF:   You develop nausea, vomiting, or shortness of breath.  You feel faint, lightheaded, or pass out.  Your chest discomfort gets worse.  You are sweating or experience sudden profound fatigue.  You do not get relief of your chest pain after 3 doses of nitro.  Your discomfort lasts longer than 15 minutes.     Radial Site Care Refer to this sheet in the next few weeks. These instructions provide you with information on caring for yourself after your procedure. Your caregiver may also give you more specific instructions. Your treatment has been planned according to current medical practices, but problems sometimes occur. Call your caregiver if you have any problems or questions after your procedure. HOME CARE INSTRUCTIONS  You may shower the day after the procedure.Remove the bandage (dressing) and gently wash the site with plain soap and water.Gently pat the site dry.  Do not apply powder or lotion to the site.  Do not submerge the affected site in water for 3 to 5 days.  Inspect the site at least twice daily.  Do not flex or bend the affected arm for 24 hours.  No lifting over 5 pounds (2.3 kg) for 5 days after your procedure.  Do not drive home if you are discharged the same day of the procedure. Have someone else drive you.  You may drive 24 hours after the procedure unless otherwise instructed by your caregiver.  Do not operate machinery or  power tools for 24 hours.  A responsible adult should be with you for the first 24 hours after you arrive home. What to expect:  Any bruising will usually fade within 1 to 2 weeks.  Blood that collects in the tissue (hematoma) may be painful to the touch. It should usually decrease in size and tenderness within 1 to 2 weeks. SEEK IMMEDIATE MEDICAL CARE IF:  You have unusual pain at the radial site.  You have redness, warmth, swelling, or pain at the radial  site.  You have drainage (other than a small amount of blood on the dressing).  You have chills.  You have a fever or persistent symptoms for more than 72 hours.  You have a fever and your symptoms suddenly get worse.  Your arm becomes pale, cool, tingly, or numb.  You have heavy bleeding from the site. Hold pressure on the site. Document Released: 07/08/2010 Document Revised: 08/28/2011 Document Reviewed: 07/08/2010 [facility] Patient Information 2014 [place], [place].

## 2013-09-22 ENCOUNTER — Ambulatory Visit: Payer: No Typology Code available for payment source | Admitting: Family Medicine

## 2013-09-22 ENCOUNTER — Encounter: Payer: Self-pay | Admitting: Family Medicine

## 2013-09-22 ENCOUNTER — Ambulatory Visit (INDEPENDENT_AMBULATORY_CARE_PROVIDER_SITE_OTHER): Payer: No Typology Code available for payment source | Admitting: Family Medicine

## 2013-09-22 VITALS — BP 116/71 | HR 71 | Temp 98.3°F | Ht 69.0 in | Wt >= 6400 oz

## 2013-09-22 DIAGNOSIS — R079 Chest pain, unspecified: Secondary | ICD-10-CM

## 2013-09-22 DIAGNOSIS — K602 Anal fissure, unspecified: Secondary | ICD-10-CM

## 2013-09-22 NOTE — Assessment & Plan Note (Addendum)
A: no recurrent. Pain attributed to work related stress. Now with fatigue with BB. P: Reassurance regarding fatigue, suspect improvement with time. Continue BB, prinzide, statin for primary prevention.  F/u in 4 weeks

## 2013-09-22 NOTE — Assessment & Plan Note (Addendum)
A: improved. Pain free. No bleeding P: continue fiber supplement. F/u prn.

## 2013-09-22 NOTE — Patient Instructions (Signed)
Mr. [name],   Thank you for coming today. Your weight is down 10# !!! Excellent.  The fatigue is most likely secondary to the beta blocker. It will get better in time. I recommend continuing the coreg at the current dose. If needed we can back off.  F/u with me on Dr. Deirdre Priesthambliss in 4 weeks.  Dr. Armen PickupFunches

## 2013-09-22 NOTE — Progress Notes (Addendum)
   Subjective:    Patient ID: Justin [name], male    DOB: 02/14/1971, 43 y.o.   MRN: [id number]  HPI 43 yo M presents for f/u visit:  1. HFU: for chest pain. I cared for him in the hospital. We was admitted for chest pain. He ruled out. He had an ECHO with EF 60-65% and LVH. He had a negative cardiac cath. Started on BB and Lipitor for primary prevention. Since d/c he reports fatigue. He reports improvement in diet. He denies chest pain and SOB.   2. Rectal bleeding and pain: resolved. No repeat bleeding.   Soc hx: chronic non smoker  Review of Systems As per HPI     Objective:   Physical Exam BP 116/71  Pulse 71  Temp(Src) 98.3 F (36.8 C) (Oral)  Ht 5\' 9"  (1.753 m)  Wt 413 lb 4.8 oz (187.472 kg)  BMI 61.01 kg/m2 Wt Readings from Last 3 Encounters:  09/22/13 413 lb 4.8 oz (187.472 kg)  09/17/13 422 lb 1.3 oz (191.455 kg)  09/17/13 422 lb 1.3 oz (191.455 kg)  General appearance: alert, cooperative, no distress and morbidly obese Lungs: clear to auscultation bilaterally Heart: regular rate and rhythm, S1, S2 normal, no murmur, click, rub or gallop       Assessment & Plan:

## 2013-09-23 ENCOUNTER — Encounter: Payer: Self-pay | Admitting: Podiatry

## 2013-09-23 ENCOUNTER — Ambulatory Visit (INDEPENDENT_AMBULATORY_CARE_PROVIDER_SITE_OTHER): Payer: No Typology Code available for payment source | Admitting: Podiatry

## 2013-09-23 VITALS — BP 119/81 | HR 86 | Resp 16 | Wt >= 6400 oz

## 2013-09-23 DIAGNOSIS — M722 Plantar fascial fibromatosis: Secondary | ICD-10-CM

## 2013-09-23 DIAGNOSIS — M7660 Achilles tendinitis, unspecified leg: Secondary | ICD-10-CM

## 2013-09-23 NOTE — Progress Notes (Signed)
He presents today for bilateral tendo Achilles tendinitis at its insertion. Has recently had a scare with cardiac problems and was catheterized. He has since lost 10 pounds in 2 months.  Objective: Vital signs are stable he is alert and oriented x3. Pulses are strong and palpable bilateral foot. Tenderness on palpation to the tendo Achilles insertion site of the bilateral heel.  Assessment: Tendo Achilles tendinitis bilateral. Obesity.  Plan: Discussed etiology pathology conservative versus surgical therapies. I suggested physical therapy at this point. I will followup with him in the near future appear

## 2013-10-14 ENCOUNTER — Other Ambulatory Visit: Payer: Self-pay | Admitting: Family Medicine

## 2013-10-15 MED ORDER — ATORVASTATIN CALCIUM 80 MG PO TABS: 80.0000 mg | ORAL_TABLET | Freq: Every day | ORAL | Status: DC

## 2013-10-15 MED ORDER — CARVEDILOL 6.25 MG PO TABS: 6.2500 mg | ORAL_TABLET | Freq: Two times a day (BID) | ORAL | Status: DC

## 2013-10-21 ENCOUNTER — Ambulatory Visit (INDEPENDENT_AMBULATORY_CARE_PROVIDER_SITE_OTHER): Payer: No Typology Code available for payment source | Admitting: Family Medicine

## 2013-10-21 ENCOUNTER — Encounter: Payer: Self-pay | Admitting: Family Medicine

## 2013-10-21 VITALS — BP 141/82 | HR 84 | Temp 98.1°F | Ht 69.0 in | Wt >= 6400 oz

## 2013-10-21 DIAGNOSIS — E669 Obesity, unspecified: Secondary | ICD-10-CM

## 2013-10-21 DIAGNOSIS — R079 Chest pain, unspecified: Secondary | ICD-10-CM

## 2013-10-21 NOTE — Patient Instructions (Signed)
Mr, [name],  Thank you for coming in today. I have placed the nutrition referral. Please call Jeannie to schedule initial visit.   Follow up with Dr. Deirdre Priesthambliss in 3 months.

## 2013-10-21 NOTE — Progress Notes (Signed)
   Subjective:    Patient ID: Justin [name], male    DOB: Mar 08, 1971, 43 y.o.   MRN: [id number] CC: CAD prevention,  Nutrition counseling.  HPI 43 yo morbidly obese man presents for f/u visit to discuss the following:  1. CAD: patient w/o CAD. Had negative CP w/u in 09/2013. On BB, ASA, statin for primary prevention of CAD given risk factors and family history. Had fatigue with BB. No longer has fatigue. Denies CP, SOB. Has not had to use NTG. 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   2. Obesity: monitoring weight at home. Working with my fitness pal app. Weight at home is 404#. Motivated to lose. Request referral to nutritionist.   Soc Hx: non smoker  Review of Systems As per HPI     Objective:   Physical Exam BP 141/82  Pulse 84  Temp(Src) 98.1 F (36.7 C) (Oral)  Ht 5\' 9"  (1.753 m)  Wt 411 lb 12.8 oz (186.791 kg)  BMI 60.78 kg/m2 Wt Readings from Last 3 Encounters:  10/21/13 411 lb 12.8 oz (186.791 kg)  09/23/13 413 lb (187.336 kg)  09/22/13 413 lb 4.8 oz (187.472 kg)  General appearance: alert, cooperative, no distress and morbidly obese Lungs: clear to auscultation bilaterally Heart: regular rate and rhythm, S1, S2 normal, no murmur, click, rub or gallop Extremities: edema trace     Assessment & Plan:

## 2013-10-21 NOTE — Assessment & Plan Note (Addendum)
A: no recurrent chest pain. At risk for CAD given fam history, HLD, obesity, male sex.  Meds: tolerating medication for primary prevention of CAD P: continue current regimen.

## 2013-10-21 NOTE — Assessment & Plan Note (Signed)
A: persistent. Motivated to change. P:amb referral to nutrition PCP f/u in 3 months.

## 2013-10-28 ENCOUNTER — Other Ambulatory Visit (HOSPITAL_COMMUNITY): Payer: Self-pay | Admitting: Family Medicine

## 2013-11-06 ENCOUNTER — Encounter: Payer: Self-pay | Admitting: Podiatry

## 2013-11-06 ENCOUNTER — Ambulatory Visit (INDEPENDENT_AMBULATORY_CARE_PROVIDER_SITE_OTHER): Payer: No Typology Code available for payment source | Admitting: Podiatry

## 2013-11-06 VITALS — BP 124/77 | HR 78 | Resp 12

## 2013-11-06 DIAGNOSIS — M7660 Achilles tendinitis, unspecified leg: Secondary | ICD-10-CM

## 2013-11-06 DIAGNOSIS — M722 Plantar fascial fibromatosis: Secondary | ICD-10-CM

## 2013-11-06 MED ORDER — OXYCODONE-ACETAMINOPHEN 10-325 MG PO TABS: 1.0000 | ORAL_TABLET | Freq: Four times a day (QID) | ORAL | Status: DC | PRN

## 2013-11-06 MED ORDER — ETODOLAC ER 400 MG PO TB24: 400.0000 mg | ORAL_TABLET | Freq: Every day | ORAL | Status: DC

## 2013-11-06 NOTE — Progress Notes (Signed)
Justin [name] presents today for followup of his bilateral plantar fasciitis in tendo Achilles tendinitis. He has recently lost 24 pounds we were still unable to reinject him at this point in time nor were we able to perform surgery. I asked that he had been taking physical therapy as we had suggested and he stated that he was unable to afford it at this time. He continues to take his Voltaren on a regular basis.  Objective: Vital signs are stable he is alert and oriented x3. Pulses are palpable bilateral. He pain on palpation medial calcaneal tubercle  And pain on palpation of the posterior process of the calcaneus with tendo Achilles tendinitis. has at this point we changed his medication to etodolac 400 mg daily and I encouraged him to at least try physical therapy once a week .  Assessment: Tendo Achilles tendinitis and capsulitis plantar fasciitis.  Plan: Change medication to etodolac and encouraged physical therapy.

## 2013-11-11 ENCOUNTER — Encounter: Payer: Self-pay | Admitting: Family Medicine

## 2013-11-11 ENCOUNTER — Ambulatory Visit (INDEPENDENT_AMBULATORY_CARE_PROVIDER_SITE_OTHER): Payer: No Typology Code available for payment source | Admitting: Family Medicine

## 2013-11-11 VITALS — BP 130/79 | HR 76 | Temp 98.2°F | Ht 69.0 in | Wt >= 6400 oz

## 2013-11-11 DIAGNOSIS — L089 Local infection of the skin and subcutaneous tissue, unspecified: Secondary | ICD-10-CM

## 2013-11-11 DIAGNOSIS — T148XXA Other injury of unspecified body region, initial encounter: Principal | ICD-10-CM

## 2013-11-11 MED ORDER — DOXYCYCLINE HYCLATE 100 MG PO TABS: 100.0000 mg | ORAL_TABLET | Freq: Two times a day (BID) | ORAL | Status: AC

## 2013-11-11 NOTE — Patient Instructions (Signed)
Wound Infection °A wound infection happens when a type of germ (bacteria) grows in a wound. Caring for the infection can help the wound heal. Wound infections need treatment. °HOME CARE  °· Only take medicine as told by your doctor. °· Take your antibiotic medicine as told. Finish it even if you start to feel better. °· Clean the wound with mild soap and water as told. Rinse the soap off. Pat the area dry with a clean towel. Do not rub the wound. °· Change any bandages (dressings) as told by your doctor. °· Put cream and a bandage on the wound as told by your doctor. °· If the bandage sticks, wet it with soapy water to remove the bandage. °· Change the bandage if it gets wet, dirty, or starts to smell. °· Take showers. Do not take baths, swim, or do anything that puts your wound under water. °· Avoid exercise that makes you sweat. °· If your wound itches, use a medicine that helps stop itching. Do not pick or scratch at the wound. °· Keep all doctor visits as told. °GET HELP RIGHT AWAY IF:  °· You have more puffiness (swelling), pain, or redness around the wound. °· You have more yellowish-white fluid (pus) coming from the wound. °· You have a bad smell coming from the wound. °· Your wound breaks open more. °· You have a fever. °MAKE SURE YOU:  °· Understand these instructions. °· Will watch your condition. °· Will get help right away if you are not doing well or get worse. °Document Released: 03/14/2008 Document Revised: 08/28/2011 Document Reviewed: 11/14/2010 °[employer]® Patient Information ©2014 [employer]. ° °

## 2013-11-11 NOTE — Assessment & Plan Note (Signed)
Wound culture sent. Wound subsequently cleaned with alcohol swab and covered with gauze. Start Doxycyline pending culture report. Tylenol prn pain. Pain instructed to keep wound clean and dry.

## 2013-11-11 NOTE — Progress Notes (Signed)
Subjective:     Patient ID: Justin [name], male   DOB: [birth year], 43 y.o.   MRN: [id number]  HPI Skin Infection:Patient presented with redness and discharge underneath his left breast crease that started Sunday night i.e 2 nights ago, it started off with a bump, gradually worsened,got big and then started discharging,he placed bandage on it which made it worse. He added bacitracin. Drainage worsening, he denies any fever.He denies similar presentation.  Current Outpatient Prescriptions on File Prior to Visit  Medication Sig Dispense Refill  . albuterol (PROVENTIL HFA;VENTOLIN HFA) 108 (90 BASE) MCG/ACT inhaler Inhale 2 puffs into the lungs every 6 (six) hours as needed for wheezing.  1 Inhaler  0  . allopurinol (ZYLOPRIM) 300 MG tablet Take 1 tablet (300 mg total) by mouth daily.  90 tablet  0  . aspirin EC 81 MG tablet Take 81 mg by mouth daily.      Marland Kitchen atorvastatin (LIPITOR) 80 MG tablet Take 1 tablet (80 mg total) by mouth daily at 6 PM.  90 tablet  1  . carvedilol (COREG) 6.25 MG tablet Take 1 tablet (6.25 mg total) by mouth 2 (two) times daily with a meal.  180 tablet  1  . Cholecalciferol (VITAMIN D PO) Take 1 tablet by mouth daily.      . Cranberry-Vitamin C-Probiotic (AZO CRANBERRY PO) Take 2 tablets by mouth daily.      . diclofenac (VOLTAREN) 75 MG EC tablet Take 75 mg by mouth 2 (two) times daily.      Marland Kitchen etodolac (LODINE XL) 400 MG 24 hr tablet Take 1 tablet (400 mg total) by mouth daily.  30 tablet  3  . FLUoxetine (PROZAC) 40 MG capsule Take 1 capsule (40 mg total) by mouth daily.  90 capsule  0  . furosemide (LASIX) 20 MG tablet Take 20 mg by mouth daily as needed. To get rid of some of the fluid on your legs      . lisinopril-hydrochlorothiazide (PRINZIDE,ZESTORETIC) 20-25 MG per tablet Take 1 tablet by mouth daily.  90 tablet  3  . Multiple Vitamin (MULTIVITAMIN WITH MINERALS) TABS Take 1 tablet by mouth daily.      Marland Kitchen NITROSTAT 0.4 MG SL tablet Place 1 tablet (0.4 mg total)  under the tongue every 5 (five) minutes as needed for chest pain. Call if no relief  25 tablet  1  . oxyCODONE-acetaminophen (PERCOCET) 10-325 MG per tablet Take 1 tablet by mouth every 4 (four) hours as needed for pain.  50 tablet  0  . oxyCODONE-acetaminophen (PERCOCET) 10-325 MG per tablet Take 1 tablet by mouth every 6 (six) hours as needed for pain.  60 tablet  0  . Zinc 50 MG TABS Take 1 tablet by mouth daily.        No current facility-administered medications on file prior to visit.   Past Medical History  Diagnosis Date  . Nephrolithiasis   . Obese   . Hypertension   . Kidney stones       Review of Systems  Respiratory: Negative.   Cardiovascular: Negative.   Skin: Positive for wound.  All other systems reviewed and are negative.  Filed Vitals:   11/11/13 0934  BP: 130/79  Pulse: 76  Temp: 98.2 F (36.8 C)  TempSrc: Oral  Height: 5\' 9"  (1.753 m)  Weight: 415 lb (188.243 kg)       Objective:   Physical Exam  Nursing note and vitals reviewed. Constitutional: He appears well-developed. No  distress.  Cardiovascular: Normal rate, regular rhythm and normal heart sounds.   No murmur heard. Pulmonary/Chest: Effort normal and breath sounds normal. No respiratory distress. He has no wheezes.  Skin:          Assessment:     Wound infection     Plan:     Check problem list

## 2013-11-14 ENCOUNTER — Telehealth: Payer: Self-pay | Admitting: Family Medicine

## 2013-11-14 LAB — WOUND CULTURE
GRAM STAIN: NONE SEEN
GRAM STAIN: NONE SEEN
GRAM STAIN: NONE SEEN
ORGANISM ID, BACTERIA: NO GROWTH

## 2013-11-14 NOTE — Telephone Encounter (Signed)
Message left, culture negative.

## 2013-11-18 ENCOUNTER — Ambulatory Visit: Payer: No Typology Code available for payment source | Admitting: Family Medicine

## 2013-11-26 ENCOUNTER — Emergency Department (HOSPITAL_COMMUNITY)
Admission: EM | Admit: 2013-11-26 | Discharge: 2013-11-26 | Disposition: A | Discharge status: Home / Self Care | Payer: No Typology Code available for payment source | Attending: Emergency Medicine | Admitting: Emergency Medicine

## 2013-11-26 ENCOUNTER — Emergency Department (HOSPITAL_COMMUNITY): Payer: No Typology Code available for payment source

## 2013-11-26 ENCOUNTER — Encounter (HOSPITAL_COMMUNITY): Payer: Self-pay | Admitting: Emergency Medicine

## 2013-11-26 DIAGNOSIS — I10 Essential (primary) hypertension: Secondary | ICD-10-CM | POA: Insufficient documentation

## 2013-11-26 DIAGNOSIS — Z7982 Long term (current) use of aspirin: Secondary | ICD-10-CM | POA: Insufficient documentation

## 2013-11-26 DIAGNOSIS — Y9389 Activity, other specified: Secondary | ICD-10-CM | POA: Insufficient documentation

## 2013-11-26 DIAGNOSIS — S86019A Strain of unspecified Achilles tendon, initial encounter: Secondary | ICD-10-CM

## 2013-11-26 DIAGNOSIS — Z79899 Other long term (current) drug therapy: Secondary | ICD-10-CM | POA: Insufficient documentation

## 2013-11-26 DIAGNOSIS — S96819A Strain of other specified muscles and tendons at ankle and foot level, unspecified foot, initial encounter: Principal | ICD-10-CM

## 2013-11-26 DIAGNOSIS — Z791 Long term (current) use of non-steroidal anti-inflammatories (NSAID): Secondary | ICD-10-CM | POA: Insufficient documentation

## 2013-11-26 DIAGNOSIS — E669 Obesity, unspecified: Secondary | ICD-10-CM | POA: Insufficient documentation

## 2013-11-26 DIAGNOSIS — Z87442 Personal history of urinary calculi: Secondary | ICD-10-CM | POA: Insufficient documentation

## 2013-11-26 DIAGNOSIS — S93499A Sprain of other ligament of unspecified ankle, initial encounter: Secondary | ICD-10-CM | POA: Insufficient documentation

## 2013-11-26 DIAGNOSIS — Y929 Unspecified place or not applicable: Secondary | ICD-10-CM | POA: Insufficient documentation

## 2013-11-26 DIAGNOSIS — W010XXA Fall on same level from slipping, tripping and stumbling without subsequent striking against object, initial encounter: Secondary | ICD-10-CM | POA: Insufficient documentation

## 2013-11-26 DIAGNOSIS — X500XXA Overexertion from strenuous movement or load, initial encounter: Secondary | ICD-10-CM | POA: Insufficient documentation

## 2013-11-26 NOTE — ED Provider Notes (Signed)
Medical screening examination/treatment/procedure(s) were performed by non-physician practitioner and as supervising physician I was immediately available for consultation/collaboration.   Lyanne Co, MD 11/26/13 1700

## 2013-11-26 NOTE — ED Provider Notes (Signed)
CSN: [id number]     Arrival date & time 11/26/13  1022 History  This chart was scribed for non-physician practitioner, Oletha BlendLisa M Naylani Bradner, PA-C, working with Lyanne CoKevin M Campos, MD by Shari HeritageAisha Amuda, ED Scribe. This patient was seen in room TR09C/TR09C and the patient's care was started at 11:49 AM.  Chief Complaint  Patient presents with  . Foot Injury    The history is provided by the patient. No language interpreter was used.   HPI Comments: Justin [name] is a 43 y.o. male who presents to the Emergency Department complaining of constant right ankle and foot pain onset after slipping and falling in the shower and everting his ankle this morning.  No head trauma or LOC.  Patient states the most pain is located in his right heel.  Pain is worse with weight bearing.  Patient is able to move his toes and has full sensation. Patient has a history of bone spurs in both heels. He sees Dr. Al CorpusHyatt, a podiatrist and they have discussed surgery for the bone spurs but the surgery cannot be performed until the patient loses weight.  Patient has prescription for Percocet from Dr. Al CorpusHyatt for chronic pain.  There is no numbness or weakness in extremities.  Pt has been ambulatory but with pain.   Past Medical History  Diagnosis Date  . Nephrolithiasis   . Obese   . Hypertension   . Kidney stones    History reviewed. No pertinent past surgical history. Family History  Problem Relation Age of Onset  . Coronary artery disease Mother 4745    MI  . Diabetes Mother   . Stroke Mother     ~2012-Deceased   History  Substance Use Topics  . Smoking status: Never Smoker   . Smokeless tobacco: Never Used  . Alcohol Use: No    Review of Systems  Constitutional: Negative for fever and chills.  Gastrointestinal: Negative for nausea and vomiting.  Musculoskeletal: Positive for arthralgias (right foot).  Neurological: Negative for weakness and numbness.  All other systems reviewed and are negative.     Allergies   Sulfonamide derivatives  Home Medications   Prior to Admission medications   Medication Sig Start Date End Date Taking? Authorizing Provider  albuterol (PROVENTIL HFA;VENTOLIN HFA) 108 (90 BASE) MCG/ACT inhaler Inhale 2 puffs into the lungs every 6 (six) hours as needed for wheezing. 04/08/13   Uvaldo RisingKyle J Fletke, MD  allopurinol (ZYLOPRIM) 300 MG tablet Take 1 tablet (300 mg total) by mouth daily. 09/02/13   Carney LivingMarshall L Chambliss, MD  aspirin EC 81 MG tablet Take 81 mg by mouth daily.    Historical Provider, MD  atorvastatin (LIPITOR) 80 MG tablet Take 1 tablet (80 mg total) by mouth daily at 6 PM. 10/15/13   Carney LivingMarshall L Chambliss, MD  carvedilol (COREG) 6.25 MG tablet Take 1 tablet (6.25 mg total) by mouth 2 (two) times daily with a meal. 10/15/13   Carney LivingMarshall L Chambliss, MD  Cholecalciferol (VITAMIN D PO) Take 1 tablet by mouth daily.    Historical Provider, MD  Cranberry-Vitamin C-Probiotic (AZO CRANBERRY PO) Take 2 tablets by mouth daily.    Historical Provider, MD  diclofenac (VOLTAREN) 75 MG EC tablet Take 75 mg by mouth 2 (two) times daily.    Historical Provider, MD  etodolac (LODINE XL) 400 MG 24 hr tablet Take 1 tablet (400 mg total) by mouth daily. 11/06/13   Max T Hyatt, DPM  FLUoxetine (PROZAC) 40 MG capsule Take 1 capsule (40  mg total) by mouth daily. 08/29/13   Carney Living, MD  furosemide (LASIX) 20 MG tablet Take 20 mg by mouth daily as needed. To get rid of some of the fluid on your legs 07/05/11   Carney Living, MD  lisinopril-hydrochlorothiazide (PRINZIDE,ZESTORETIC) 20-25 MG per tablet Take 1 tablet by mouth daily. 12/10/12   Carney Living, MD  Multiple Vitamin (MULTIVITAMIN WITH MINERALS) TABS Take 1 tablet by mouth daily.    Historical Provider, MD  NITROSTAT 0.4 MG SL tablet Place 1 tablet (0.4 mg total) under the tongue every 5 (five) minutes as needed for chest pain. Call if no relief 10/28/13   Carney Living, MD  oxyCODONE-acetaminophen (PERCOCET) 10-325  MG per tablet Take 1 tablet by mouth every 4 (four) hours as needed for pain. 08/28/13   Max T Hyatt, DPM  oxyCODONE-acetaminophen (PERCOCET) 10-325 MG per tablet Take 1 tablet by mouth every 6 (six) hours as needed for pain. 11/06/13   Max T Hyatt, DPM  Zinc 50 MG TABS Take 1 tablet by mouth daily.     Historical Provider, MD  Triage Vitals: BP 115/58  Pulse 75  Temp(Src) 98.1 F (36.7 C) (Oral)  Resp 16  Ht 5\' 9"  (1.753 m)  Wt 413 lb (187.336 kg)  BMI 60.96 kg/m2  SpO2 96% Physical Exam  Nursing note and vitals reviewed. Constitutional: He is oriented to person, place, and time. He appears well-developed and well-nourished.  HENT:  Head: Normocephalic and atraumatic.  Mouth/Throat: Oropharynx is clear and moist.  Eyes: Conjunctivae and EOM are normal. Pupils are equal, round, and reactive to light.  Neck: Normal range of motion.  Cardiovascular: Normal rate, regular rhythm and normal heart sounds.   Pulmonary/Chest: Effort normal and breath sounds normal.  Abdominal: Soft. Bowel sounds are normal.  Musculoskeletal: Normal range of motion.       Right ankle: Tenderness. Achilles tendon normal.  Tenderness along inferior aspect of achilles tendon.  Tendon appears intact. No visible swelling or bony deformities.  Distal DP pulse intact. Moving all toes appropriately.   Neurological: He is alert and oriented to person, place, and time.  Skin: Skin is warm and dry.  Psychiatric: He has a normal mood and affect.    ED Course  Procedures (including critical care time) DIAGNOSTIC STUDIES: Oxygen Saturation is 96% on room air, adequate by my interpretation.    COORDINATION OF CARE:  11:57 AM- Patient informed of current plan for treatment and evaluation and agrees with plan at this time.    Imaging Review Dg Ankle Complete Right  11/26/2013   CLINICAL DATA:  Right ankle pain  EXAM: RIGHT ANKLE - COMPLETE 3+ VIEW  COMPARISON:  None.  FINDINGS: No acute fracture or dislocation is  noted. Generalized soft tissue swelling is seen. Calcaneal spurs are noted.  IMPRESSION: No acute bony abnormality seen.   Electronically Signed   By: Alcide Clever M.D.   On: 11/26/2013 11:38   Dg Foot Complete Right  11/26/2013   CLINICAL DATA:  Larey Seat with pain across the heel and ankle  EXAM: RIGHT FOOT COMPLETE - 3+ VIEW  COMPARISON:  None.  FINDINGS: The bones are reasonably well mineralized. The phalanges and metatarsals are intact. No acute tarsal bone fracture or dislocation is demonstrated. There are large plantar and Achilles region calcaneal spurs. The overlying soft tissues are swollen over the midfoot.  IMPRESSION: There is no acute bony abnormality of the right foot.   Electronically Signed  By: David  [place]   On: 11/26/2013 11:35    MDM   Final diagnoses:  Strain of Achilles tendon   42 ear-old male with slip and fall injury this morning, now with pain of posterior right ankle and right foot along achilles tendon.  He has hx of bone spurs in this area.  Foot is neurovascularly intact without bony deformities.  X-ray negative for acute findings. Patient's pain is along his inferior Achilles tendon attachment, however tendon feels intact and there is normal range of motion of ankle. Patient is established with Dr. Al Corpus of podiatry, he will followup with him. Patient placed in ASO ankle brace.  He has oxycodone at home that he will take for pain.  Discussed plan with patient, he/she acknowledged understanding and agreed with plan of care.  Return precautions given for new or worsening symptoms.  I personally performed the services described in this documentation, which was scribed in my presence. The recorded information has been reviewed and is accurate.  Garlon Hatchet, PA-C 11/26/13 1322

## 2013-11-26 NOTE — Discharge Instructions (Signed)
Continue your home pain medications. Follow-up with Dr. Al Corpus. Return to the ED for new or worsening symptoms.

## 2013-11-26 NOTE — ED Notes (Signed)
He slipped in shower this am and twisted R ankle. He states his R foot and ankle have been hurting since, increased pain with ambulation. Denies any other injuries. Cms intact

## 2013-12-18 ENCOUNTER — Ambulatory Visit (INDEPENDENT_AMBULATORY_CARE_PROVIDER_SITE_OTHER): Payer: No Typology Code available for payment source | Admitting: Podiatry

## 2013-12-18 ENCOUNTER — Encounter: Payer: Self-pay | Admitting: Podiatry

## 2013-12-18 VITALS — BP 159/80 | HR 92 | Resp 12

## 2013-12-18 DIAGNOSIS — M7660 Achilles tendinitis, unspecified leg: Secondary | ICD-10-CM

## 2013-12-18 MED ORDER — OXYCODONE-ACETAMINOPHEN 10-325 MG PO TABS: 1.0000 | ORAL_TABLET | Freq: Three times a day (TID) | ORAL | Status: DC | PRN

## 2013-12-18 NOTE — Progress Notes (Signed)
He presents today after injuring his right tendo Achilles in the shower. He sought attention from the emergency department we suggested that he continue anti-inflammatories.  Objective: Vital signs are stable he is alert and oriented x3. He has severe pain tendo Achilles bilateral. Palpation does not demonstrate any deficits of the bilateral tendo Achilles the majority of his symptoms are at its insertion site on the medial and lateral calcaneus bilaterally.  Assessment: Chronic intractable Achilles tendinitis bilaterally. This is associated with his obesity.  Plan: Discussed etiology pathology conservative versus surgical therapies. We discussed bariatric evaluation for possible surgical correction. Losing weight I do believe would help tremendously with the pain that he is experiencing in his Achilles tendons. I injected the bilateral Achilles today with dexamethasone and local anesthetic just enough to alleviate his symptoms. Also wrote a prescription for Percocet she does not typically abuse.

## 2013-12-22 ENCOUNTER — Other Ambulatory Visit: Payer: Self-pay | Admitting: *Deleted

## 2013-12-22 MED ORDER — LISINOPRIL-HYDROCHLOROTHIAZIDE 20-25 MG PO TABS: 1.0000 | ORAL_TABLET | Freq: Every day | ORAL | Status: DC

## 2013-12-25 ENCOUNTER — Telehealth: Payer: Self-pay | Admitting: Family Medicine

## 2013-12-25 NOTE — Telephone Encounter (Signed)
Pt called and recently has insurance. He would like a prescriptions for his Sleep Apnea [facility] Please call once this is done so that they can go pick them up. jw

## 2013-12-25 NOTE — Telephone Encounter (Signed)
I need to know specific name of supplies and settings.  Best if he contacts [facility] and they send me a fax to complete  Please notify him  Thanks  LC

## 2013-12-26 ENCOUNTER — Encounter: Payer: Self-pay | Admitting: Family Medicine

## 2013-12-26 NOTE — Telephone Encounter (Signed)
LMOVM for pt to return call.  Please obtain below info from pt.

## 2013-12-29 NOTE — Telephone Encounter (Signed)
[facility] Migdalia Olejniczak, Maryjo RochesterJessica Dawn

## 2013-12-29 NOTE — Telephone Encounter (Signed)
[facility] Written [facility]  Thanks  LC

## 2014-01-06 ENCOUNTER — Ambulatory Visit (INDEPENDENT_AMBULATORY_CARE_PROVIDER_SITE_OTHER): Payer: No Typology Code available for payment source | Admitting: Family Medicine

## 2014-01-06 ENCOUNTER — Encounter: Payer: Self-pay | Admitting: Family Medicine

## 2014-01-06 VITALS — BP 146/75 | HR 90 | Temp 98.4°F | Wt >= 6400 oz

## 2014-01-06 DIAGNOSIS — M545 Low back pain, unspecified: Secondary | ICD-10-CM

## 2014-01-06 DIAGNOSIS — E669 Obesity, unspecified: Secondary | ICD-10-CM

## 2014-01-06 LAB — POCT URINALYSIS DIPSTICK
Bilirubin, UA: NEGATIVE
Glucose, UA: NEGATIVE
Ketones, UA: NEGATIVE
Leukocytes, UA: NEGATIVE
NITRITE UA: NEGATIVE
PH UA: 6.5
PROTEIN UA: NEGATIVE
RBC UA: NEGATIVE
UROBILINOGEN UA: 0.2

## 2014-01-06 NOTE — Assessment & Plan Note (Signed)
Recurrent.  Sounds to be musculoskeletal no sign of renal stone or cancer or fracture.  Treat symptomatically with analgesics.  If persists consider trial of gabapentin

## 2014-01-06 NOTE — Progress Notes (Signed)
   Subjective:    Patient ID: Justin [name], male    DOB: [birth year], 43 y.o.   MRN: [id number]  HPI  Obesity He is investigating [facility] weight loss surgery and needs forms completed.  His weight effects his knees back and mobility   BACK PAIN  Back pain began yesterday Pain is described as focal achy on left side. Patient has tried just his usual lodine Did take on percocet yesterday for his heel pain  Pain radiates none. History of trauma or injury: no Prior history of similar pain: not like his prior renal stone  History of cancer: no Weak immune system:  no History of IV drug use: no History of steroid use: no  Symptoms Incontinence of bowel or bladder:  no Numbness of leg: no Fever: no Rest or Night pain: no Weight Loss:  no Rash: no  Chief Complaint noted Review of Symptoms - see HPI PMH - Smoking status noted.   Vital Signs reviewed    Review of Systems     Objective:   Physical Exam  No acute distress Obese Mildly tender R sided paraspinous lumbar area No cvat Able to stand on toes cant on heels due to heel pain  No pain with range of motion of hips  No SLR radiation but does increase pain    UA noted     Assessment & Plan:

## 2014-01-06 NOTE — Assessment & Plan Note (Signed)
Severe.  It causes musculoskeletal pain (heel and back) lower extremity edema, hypertension, sleep apnea and gout  Will complete application for bariatric surgery

## 2014-01-06 NOTE — Patient Instructions (Addendum)
Good to see you today!  Thanks for coming in.  I will fill out paperwork and [facility]  For the back pain I would take the Lodine as you are with additional tylenol.  If it gets severe take a percocet  I will call you if your urine shows a stone   Call us or go to the ER if you lose control of your bowels or bladder or are weak in one leg  If the pain is not better in 1 week then we should start gabapentin - call us and I will call in

## 2014-01-07 ENCOUNTER — Ambulatory Visit (INDEPENDENT_AMBULATORY_CARE_PROVIDER_SITE_OTHER): Payer: No Typology Code available for payment source | Admitting: Home Health Services

## 2014-01-07 VITALS — Wt >= 6400 oz

## 2014-01-07 DIAGNOSIS — E669 Obesity, unspecified: Secondary | ICD-10-CM

## 2014-01-07 NOTE — Progress Notes (Signed)
Patient Identified Concern:  Weight loss Stage of Change Patient Is In:  Preparation- has been making changes for less than 6 months Patient Reported Barriers:  Unhealthy habits, inability to exercise b/c of pain, family habits, portion sizes Patient Reported Perceived Benefits:  Feeling better, can become a candidate for bariatric surgery.  Patient Reports Self-Efficacy:   Low self-efficacy for behavioral changes around exercise.  8-9 out of a ten for behavioral changes around diet. Behavior Change Supports:  Wife and daughter are also willing to work on weight loss and make changes in the home.  Goals:  Eat 1 serving of vegetables a day, only 1 plate of food at meal time (no second helpings), create a weekly meal plan August 2-8.  Follow up with Rosalita ChessmanSuzanne on 8/10 over telephone on goals.   Will follow up with in-person health coach 1x a month.  Patient Education:  We discussed basic principles of calorie intake and exercise.  We discussed portion sizes.  We discussed importance of movement/exercise and how they might start moving more.  Pt is not ready to make exercise changes but is highly motivated to make food changes.  Other challenges:  Pt eats frequently, lack of fruit and vegetables in diet, lack of understanding of caloric intake and importance of movement.  Sedentary in job more of the time.

## 2014-01-17 ENCOUNTER — Encounter: Payer: Self-pay | Admitting: Family Medicine

## 2014-01-29 ENCOUNTER — Ambulatory Visit (INDEPENDENT_AMBULATORY_CARE_PROVIDER_SITE_OTHER): Payer: No Typology Code available for payment source | Admitting: Podiatry

## 2014-01-29 ENCOUNTER — Encounter: Payer: Self-pay | Admitting: Podiatry

## 2014-01-29 VITALS — BP 148/80 | HR 90 | Resp 18

## 2014-01-29 DIAGNOSIS — M7660 Achilles tendinitis, unspecified leg: Secondary | ICD-10-CM

## 2014-01-29 MED ORDER — OXYCODONE-ACETAMINOPHEN 10-325 MG PO TABS: 1.0000 | ORAL_TABLET | ORAL | Status: DC | PRN

## 2014-01-30 ENCOUNTER — Other Ambulatory Visit: Payer: Self-pay | Admitting: Family Medicine

## 2014-01-30 NOTE — Progress Notes (Signed)
He presents today complaining of bilateral heel pain. He denies fever chills nausea vomiting muscle aches and pains. States that he's lost 26 pounds. And are last visit we had discussed area after surgery which may help him with diabetes as well as significant weight loss to either alleviate his chronic Achilles tendinosis or allow safe conditions for surgical repair. He is currently scheduled for bariatric surgery in 3 months.  Objective: Vital signs are stable he is alert and oriented x3. He's having significant pain around the posterior medial aspect of the tendo Achilles as it inserts in the medial aspect of the calcaneus posteriorly. There is overlying erythema no edema cellulitis drainage or odor. No skin breakdown. Pulses remain palpable.  Assessment: Chronic Achilles tendinitis bilateral. Obese individual.  Plan: Injected 2 mg of dexamethasone to the point of maximal tenderness on the posterior medial aspect of the calcaneus the medial fibers of the Achilles will be most affected. I warned him against any high impact activities and we also dispensed a prescription for 50 Percocet.

## 2014-02-03 ENCOUNTER — Ambulatory Visit (INDEPENDENT_AMBULATORY_CARE_PROVIDER_SITE_OTHER): Payer: No Typology Code available for payment source | Admitting: Home Health Services

## 2014-02-03 ENCOUNTER — Encounter: Payer: Self-pay | Admitting: Home Health Services

## 2014-02-03 VITALS — Ht 69.0 in | Wt >= 6400 oz

## 2014-02-03 DIAGNOSIS — E669 Obesity, unspecified: Secondary | ICD-10-CM

## 2014-02-03 NOTE — Progress Notes (Signed)
Patient Identified Concern:  weightloss Stage of Change Patient Is In:  Preparation has been making changes for 1 month. Patient Reported Barriers:  Old habits, lack of nutrition information Patient Reported Perceived Benefits:  Feeling better, being healthier Patient Reports Self-Efficacy:   Pt verbalized high self efficacy for dietary changes they have implemented.  Behavior Change Supports:  Wife and daughter are also working on weight loss.  Goals:  Continue making weekly meal menus, eating at least 1 vegetable a day, limiting sugary drinks, eating 1 plate of food at meal time, follow up phone call with Justin [name] on 9/1 to review goals.   Patient Education:  We talked about what has worked for them this past month.  They said the weekly meal menus have helped with smaller portions and with saving some money.  They reported starting to use salad plates and only have 1 plate at meal time.  Justin [name] reports limiting sugary drinks and switch to artificial sweeteners.  Justin StableBill has bariatric consultation 8/19.   Justin [name] starting weight 409, today 404 Justin [name] starting weight 220 today 211.8 Justin [name] starting weight 130 today 124

## 2014-02-04 ENCOUNTER — Ambulatory Visit (INDEPENDENT_AMBULATORY_CARE_PROVIDER_SITE_OTHER): Payer: No Typology Code available for payment source | Admitting: General Surgery

## 2014-02-04 ENCOUNTER — Encounter (INDEPENDENT_AMBULATORY_CARE_PROVIDER_SITE_OTHER): Payer: Self-pay | Admitting: General Surgery

## 2014-02-04 ENCOUNTER — Other Ambulatory Visit (INDEPENDENT_AMBULATORY_CARE_PROVIDER_SITE_OTHER): Payer: Self-pay | Admitting: General Surgery

## 2014-02-04 VITALS — BP 136/80 | HR 81 | Temp 97.6°F | Ht 70.0 in | Wt >= 6400 oz

## 2014-02-04 DIAGNOSIS — Z6841 Body Mass Index (BMI) 40.0 and over, adult: Secondary | ICD-10-CM

## 2014-02-04 NOTE — Progress Notes (Signed)
Patient ID: Justin [name], male   DOB: January 05, 1971, 43 y.o.   MRN: [id number]  Chief Complaint  Patient presents with  . Bariatric Pre-op  . Weight Loss Surgery    HPI Justin [name] is a 43 y.o. male.   HPI 43 yo morbidly obese WM referred by Dr Pearlean Brownie for evaluation of weight loss surgery. The patient has struggled with his weight for the past 16 years. Despite numerous attempts for sustained weight loss he has been unsuccessful. He has tried Weight Watchers several times, low calorie diets, and physician supervised diets all without any long-term success. He is also done over-the-counter weight loss medications as well. He is primarily interested in a laparoscopic gastric bypass. He has had friends who have the procedure and have been successful. He attended one of our lives seminars   Past Medical History  Diagnosis Date  . Nephrolithiasis   . Obese   . Hypertension   . Kidney stones   . OSA on CPAP     Past Surgical History  Procedure Laterality Date  . Cardiac catheterization  08/2013    normal arteries/function    Family History  Problem Relation Age of Onset  . Coronary artery disease Mother 74    MI  . Diabetes Mother   . Stroke Mother     ~2012-Deceased    Social History History  Substance Use Topics  . Smoking status: Never Smoker   . Smokeless tobacco: Never Used  . Alcohol Use: No    Allergies  Allergen Reactions  . Sulfonamide Derivatives Hives    Current Outpatient Prescriptions  Medication Sig Dispense Refill  . albuterol (PROVENTIL HFA;VENTOLIN HFA) 108 (90 BASE) MCG/ACT inhaler Inhale 2 puffs into the lungs every 6 (six) hours as needed for wheezing.  1 Inhaler  0  . allopurinol (ZYLOPRIM) 300 MG tablet Take 1 tablet (300 mg total) by mouth daily.  90 tablet  0  . aspirin EC 81 MG tablet Take 81 mg by mouth daily.      Marland Kitchen atorvastatin (LIPITOR) 80 MG tablet Take 1 tablet (80 mg total) by mouth daily at 6 PM.  90 tablet  1  .  carvedilol (COREG) 6.25 MG tablet Take 1 tablet (6.25 mg total) by mouth 2 (two) times daily with a meal.  180 tablet  1  . Cholecalciferol (VITAMIN D PO) Take 1 tablet by mouth daily.      . Cranberry-Vitamin C-Probiotic (AZO CRANBERRY PO) Take 2 tablets by mouth daily.      . diclofenac (VOLTAREN) 75 MG EC tablet Take 75 mg by mouth 2 (two) times daily.      Marland Kitchen etodolac (LODINE XL) 400 MG 24 hr tablet Take 1 tablet (400 mg total) by mouth daily.  30 tablet  3  . FLUoxetine (PROZAC) 40 MG capsule Take 1 capsule (40 mg total) by mouth daily.  90 capsule  0  . furosemide (LASIX) 20 MG tablet Take 20 mg by mouth daily as needed. To get rid of some of the fluid on your legs      . lisinopril-hydrochlorothiazide (PRINZIDE,ZESTORETIC) 20-25 MG per tablet Take 1 tablet by mouth daily.  90 tablet  3  . Multiple Vitamin (MULTIVITAMIN WITH MINERALS) TABS Take 1 tablet by mouth daily.      Marland Kitchen NITROSTAT 0.4 MG SL tablet Place 1 tablet (0.4 mg total) under the tongue every 5 (five) minutes as needed for chest pain. Call if no relief  25 tablet  1  . oxyCODONE-acetaminophen (PERCOCET) 10-325 MG per tablet Take 1 tablet by mouth every 6 (six) hours as needed for pain.  60 tablet  0  . oxyCODONE-acetaminophen (PERCOCET) 10-325 MG per tablet Take 1 tablet by mouth every 8 (eight) hours as needed for pain.  50 tablet  0  . oxyCODONE-acetaminophen (PERCOCET) 10-325 MG per tablet Take 1 tablet by mouth every 4 (four) hours as needed for pain.  50 tablet  0  . Zinc 50 MG TABS Take 1 tablet by mouth daily.        No current facility-administered medications for this visit.    Review of Systems Review of Systems  Constitutional: Negative for fever, chills, appetite change and unexpected weight change.       Is a Pastor  HENT: Negative for congestion and trouble swallowing.   Eyes: Negative for visual disturbance.  Respiratory: Negative for chest tightness and shortness of breath.        +OSA on CPAP for about 1.5  yrs  Cardiovascular: Negative for chest pain and leg swelling.       No PND, no orthopnea, + DOE; was admitted with Chest Pain in march 2015 - nml echo and had completely clean cardiac cath  Gastrointestinal: Negative for nausea, vomiting, abdominal pain, diarrhea, constipation and blood in stool.       Some occasional reflux and takes TUMS occasionally.   Genitourinary: Negative for dysuria and hematuria.       Remote h/o kidney stones  Musculoskeletal: Positive for back pain.       Has b/l foot bone spurs which cause large amount of pain; has seen podiatrist. Has not had any gout flares  Skin: Negative for rash.  Neurological: Negative for seizures and speech difficulty. Headaches: occasional.  Hematological: Does not bruise/bleed easily.       Denies prior h/o blood clots  Psychiatric/Behavioral: Negative for behavioral problems and confusion.    Blood pressure 136/80, pulse 81, temperature 97.6 F (36.4 C), height 5\' 10"  (1.778 m), weight 408 lb 6.4 oz (185.249 kg).  Physical Exam Physical Exam  Vitals reviewed. Constitutional: He is oriented to person, place, and time. He appears well-developed and well-nourished. No distress.  Central truncal obesity  HENT:  Head: Normocephalic and atraumatic.  Right Ear: External ear normal.  Left Ear: External ear normal.  Eyes: Conjunctivae are normal. No scleral icterus.  Neck: Normal range of motion. Neck supple. No tracheal deviation present. No thyromegaly present.  Short thick neck  Cardiovascular: Normal rate, normal heart sounds and intact distal pulses.   Pulmonary/Chest: Effort normal and breath sounds normal. No respiratory distress. He has no wheezes.  Abdominal: Soft. He exhibits no distension. There is no tenderness. There is no rebound and no guarding.  Musculoskeletal: Normal range of motion. He exhibits no edema and no tenderness.  Lymphadenopathy:    He has no cervical adenopathy.  Neurological: He is alert and oriented  to person, place, and time. He exhibits normal muscle tone.  Skin: Skin is warm and dry. No rash noted. He is not diaphoretic. No erythema. No pallor.  Psychiatric: He has a normal mood and affect. His behavior is normal. Judgment and thought content normal.    Data Reviewed Echo 3/15 - some LVH, nml fxn, ef 60% Hospital dc summary 3/15 Cardiac cath 3/15 - normal A1C 5.7 in 3/15 PCP notes over past several months  Assessment    Super Morbid obesity BMI 58.6  OSA on CPAP Hypertension H/o  kidney stones Pre-diabetes Gout Chronic low back pain B/l foot pain    Plan    The patient meets weight loss surgery criteria. I think the patient would be an acceptable candidate for Laparoscopic Roux-en-Y Gastric bypass.   We discussed laparoscopic Roux-en-Y gastric bypass. We discussed the preoperative, operative and postoperative process. Using diagrams, I explained the surgery in detail including the performance of an EGD near the end of the surgery and an Upper GI swallow study on POD 1. We discussed the typical hospital course including a 2-3 day stay baring any complications.   The patient was given educational material. I quoted the patient that they can expect to lose 50-70% of their excess weight with the gastric bypass. We did discuss the possibility of weight regain several years after the procedure.  We discussed the risk and benefits of surgery including but not limited to anesthesia risk, bleeding, infection, anastomotic edema requiring a few additional days in the hospital, postop nausea, possible conversion to open procedure, blood clot formation, anastomotic leak, anastomotic stricture, ulcer formation, death, respiratory complications, intestinal blockage, internal hernia, gallstone formation, vitamin and nutritional deficiencies, hair loss, weight regain injury to surrounding structures, failure to lose weight and mood changes.  We discussed that before and after surgery that there  would be an alteration in their diet. I explained that we have put them on a diet 2 weeks before surgery. I also explained that they would be on a liquid diet for 2 weeks after surgery. We discussed that they would have to avoid certain foods such as sugar after surgery. We discussed the importance of physical activity as well as compliance with our dietary and supplement recommendations and routine follow-up.  I explained to the patient that we will start our evaluation process which includes labs, Upper GI to evaluate stomach and swallowing anatomy, nutritionist consultation, psychiatrist consultation, EKG, CXR, abdominal ultrasound.  The patient was given EMMI video access on laparoscopic roux en y gastric bypass.  In the interim I advised him and encouraged him to start losing as much weight as possible. We discussed activities which should be light on his feet such as swimming or using a recumbent bicycle.  Mary Sella. Andrey Campanile, MD, FACS General, Bariatric, & Minimally Invasive Surgery [facility]  Note: This dictation was prepared with Dragon/digital dictation along with [facility] technology. Any transcriptional errors that result from this process are unintentional.          Atilano Ina 02/05/2014, 2:47 PM

## 2014-02-04 NOTE — Patient Instructions (Signed)
Congratulations on starting your journey to a healthier life! Over the next few weeks you will be undergoing tests (x-rays and labs) and seeing specialists to help evaluate you for weight loss surgery.  These tests and consultations with a psychologist and nutritionist are needed to prepare you for the lifestyle changes that lie ahead and are often required by insurance companies to approve you for surgery.   Pathway to Surgery:  Over the next few weeks -->start exercising regularly (swimming, using recumbent bicycle- you need to work on weight loss prior to surgery) -->Lab work -->Radiology tests   - Chest x-ray - make sure your lungs are normal before surgery  - Upper GI - you drink barium and pictures are taken as it travels down your  esophagus and into your stomach - looks for reflux and a hiatal hernia which may  need to repaired at the same time as your weight loss surgery  - Abdominal Ultrasound - looks at your gallbladder and liver  - Mammogram - up to date mammogram if you are a male -->EKG  -->Sleep study - if you are felt to be at high risk for obstructive sleep apnea -->H. Pylori breath test ([employer]) - you surgeon may order this test to see if you have  a bacteria (H pylori) in your stomach which makes you at higher risk to develop a  ulcer or inflammation of your stomach -->Nutrition consultation -->Psychologist consultation -->Other specialist consults - your surgeon may determine that you need to see a  specialist like a cardiologist or pulmnologist depending on your health history -->Watch EMMI video about your planned weight loss surgery -->attend support group meeting -->you can look at [email or web address] to learn more about weight loss surgery and  compare surgery outcomes -->you can look at our new website - [email or web address] - available Sept 2015  Two weeks prior to surgery  Go on the extremely low carb liquid diet - this will decrease the size of your liver  which  will make surgery safer - the nutritionist will go over this at a later date  Attend preoperative appointment with your surgeon  Attend preoperative surgery class  One week prior to surgery  No aspirin products.  Tylenol is acceptable   24 hours prior to surgery  No alcoholic beverages  Report fever greater than 100.5 or excessive nasal drainage suggesting infection  Continue bariatric preop diet  Perform bowel prep if ordered  Do not eat or drink anything after midnight the night before surgery  Do not take any medications except those instructed by the anesthesiologist  Morning of surgery  Please arrive at the hospital at least 2 hours before your scheduled surgery time.  No makeup, fingernail polish or jewelry  Bring insurance cards with you  Bring your CPAP mask if you use this

## 2014-02-05 ENCOUNTER — Other Ambulatory Visit (INDEPENDENT_AMBULATORY_CARE_PROVIDER_SITE_OTHER): Payer: Self-pay | Admitting: General Surgery

## 2014-02-05 ENCOUNTER — Encounter (INDEPENDENT_AMBULATORY_CARE_PROVIDER_SITE_OTHER): Payer: Self-pay | Admitting: General Surgery

## 2014-02-05 LAB — IRON AND TIBC
%SAT: 18 % — AB (ref 20–55)
Iron: 58 ug/dL (ref 42–165)
TIBC: 316 ug/dL (ref 215–435)
UIBC: 258 ug/dL (ref 125–400)

## 2014-02-05 LAB — CBC WITH DIFFERENTIAL/PLATELET
BASOS PCT: 2 % — AB (ref 0–1)
Basophils Absolute: 0.2 10*3/uL — ABNORMAL HIGH (ref 0.0–0.1)
Eosinophils Absolute: 0.3 10*3/uL (ref 0.0–0.7)
Eosinophils Relative: 3 % (ref 0–5)
HCT: 40.2 % (ref 39.0–52.0)
Hemoglobin: 14.1 g/dL (ref 13.0–17.0)
LYMPHS ABS: 3 10*3/uL (ref 0.7–4.0)
Lymphocytes Relative: 35 % (ref 12–46)
MCH: 30.4 pg (ref 26.0–34.0)
MCHC: 35.1 g/dL (ref 30.0–36.0)
MCV: 86.6 fL (ref 78.0–100.0)
Monocytes Absolute: 0.6 10*3/uL (ref 0.1–1.0)
Monocytes Relative: 7 % (ref 3–12)
NEUTROS PCT: 53 % (ref 43–77)
Neutro Abs: 4.5 10*3/uL (ref 1.7–7.7)
PLATELETS: 312 10*3/uL (ref 150–400)
RBC: 4.64 MIL/uL (ref 4.22–5.81)
RDW: 14.4 % (ref 11.5–15.5)
WBC: 8.5 10*3/uL (ref 4.0–10.5)

## 2014-02-05 LAB — LIPID PANEL
CHOL/HDL RATIO: 3.6 ratio
Cholesterol: 114 mg/dL (ref 0–200)
HDL: 32 mg/dL — AB (ref >39)
LDL CALC: 51 mg/dL (ref 0–99)
Triglycerides: 156 mg/dL — ABNORMAL HIGH (ref <150)
VLDL: 31 mg/dL (ref 0–40)

## 2014-02-05 LAB — PROTIME-INR
INR: 1.03 (ref <1.50)
Prothrombin Time: 13.5 seconds (ref 11.6–15.2)

## 2014-02-05 LAB — VITAMIN B12: Vitamin B-12: 677 pg/mL (ref 211–911)

## 2014-02-05 LAB — COMPREHENSIVE METABOLIC PANEL
ALK PHOS: 72 U/L (ref 39–117)
ALT: 27 U/L (ref 0–53)
AST: 13 U/L (ref 0–37)
Albumin: 4.1 g/dL (ref 3.5–5.2)
BUN: 14 mg/dL (ref 6–23)
CO2: 28 meq/L (ref 19–32)
CREATININE: 0.89 mg/dL (ref 0.50–1.35)
Calcium: 9.3 mg/dL (ref 8.4–10.5)
Chloride: 101 mEq/L (ref 96–112)
GLUCOSE: 94 mg/dL (ref 70–99)
Potassium: 4.1 mEq/L (ref 3.5–5.3)
SODIUM: 138 meq/L (ref 135–145)
Total Bilirubin: 0.7 mg/dL (ref 0.2–1.2)
Total Protein: 7.2 g/dL (ref 6.0–8.3)

## 2014-02-05 LAB — MAGNESIUM: MAGNESIUM: 1.9 mg/dL (ref 1.5–2.5)

## 2014-02-05 LAB — HEMOGLOBIN A1C
HEMOGLOBIN A1C: 6 % — AB (ref <5.7)
Mean Plasma Glucose: 126 mg/dL — ABNORMAL HIGH (ref <117)

## 2014-02-05 LAB — FOLATE

## 2014-02-05 LAB — T4: T4 TOTAL: 8.2 ug/dL (ref 5.0–12.5)

## 2014-02-05 LAB — H. PYLORI ANTIBODY, IGG: H PYLORI IGG: 0.51 {ISR}

## 2014-02-05 LAB — TSH: TSH: 3.01 u[IU]/mL (ref 0.350–4.500)

## 2014-02-06 LAB — VITAMIN D 25 HYDROXY (VIT D DEFICIENCY, FRACTURES): VIT D 25 HYDROXY: 46 ng/mL (ref 30–89)

## 2014-02-18 ENCOUNTER — Other Ambulatory Visit: Payer: Self-pay | Admitting: *Deleted

## 2014-02-18 MED ORDER — ALLOPURINOL 300 MG PO TABS: 300.0000 mg | ORAL_TABLET | Freq: Every day | ORAL | Status: DC

## 2014-02-28 ENCOUNTER — Encounter: Payer: No Typology Code available for payment source | Attending: General Surgery | Admitting: Dietician

## 2014-02-28 ENCOUNTER — Encounter: Payer: Self-pay | Admitting: Dietician

## 2014-02-28 VITALS — Ht 69.0 in | Wt >= 6400 oz

## 2014-02-28 DIAGNOSIS — Z713 Dietary counseling and surveillance: Secondary | ICD-10-CM | POA: Diagnosis not present

## 2014-02-28 DIAGNOSIS — Z6841 Body Mass Index (BMI) 40.0 and over, adult: Secondary | ICD-10-CM | POA: Diagnosis not present

## 2014-02-28 DIAGNOSIS — Z01818 Encounter for other preprocedural examination: Secondary | ICD-10-CM | POA: Diagnosis not present

## 2014-02-28 NOTE — Progress Notes (Signed)
  Pre-Op Assessment Visit:  Pre-Operative RYGB Surgery  Medical Nutrition Therapy:  Appt start time: 125   End time:  200  Patient was seen on 02/28/2014 for Pre-Operative RYGB Nutrition Assessment. Assessment and letter of approval faxed to [facility] Bariatric Surgery Program coordinator on 02/28/2014.   Preferred Learning Style:   No preference indicated   Learning Readiness:   Ready   Handouts given during visit include:  Pre-Op Goals Bariatric Surgery Protein Shakes  Teaching Method Utilized:  Visual Auditory  Barriers to learning/adherence to lifestyle change: none  Demonstrated degree of understanding via:  Teach Back   Patient to call the [facility] to enroll in Pre-Op and Post-Op Nutrition Education when surgery date is scheduled.

## 2014-03-02 ENCOUNTER — Ambulatory Visit (HOSPITAL_COMMUNITY)
Admission: RE | Admit: 2014-03-02 | Discharge: 2014-03-02 | Disposition: A | Discharge status: Home / Self Care | Payer: No Typology Code available for payment source | Source: Ambulatory Visit | Attending: General Surgery | Admitting: General Surgery

## 2014-03-02 DIAGNOSIS — M545 Low back pain, unspecified: Secondary | ICD-10-CM | POA: Diagnosis not present

## 2014-03-02 DIAGNOSIS — G8929 Other chronic pain: Secondary | ICD-10-CM | POA: Insufficient documentation

## 2014-03-02 DIAGNOSIS — Z6841 Body Mass Index (BMI) 40.0 and over, adult: Secondary | ICD-10-CM | POA: Diagnosis not present

## 2014-03-02 DIAGNOSIS — M109 Gout, unspecified: Secondary | ICD-10-CM | POA: Diagnosis not present

## 2014-03-02 DIAGNOSIS — I10 Essential (primary) hypertension: Secondary | ICD-10-CM | POA: Insufficient documentation

## 2014-03-02 DIAGNOSIS — G4733 Obstructive sleep apnea (adult) (pediatric): Secondary | ICD-10-CM | POA: Insufficient documentation

## 2014-03-02 DIAGNOSIS — N200 Calculus of kidney: Secondary | ICD-10-CM | POA: Diagnosis not present

## 2014-03-02 DIAGNOSIS — M79609 Pain in unspecified limb: Secondary | ICD-10-CM | POA: Insufficient documentation

## 2014-03-02 DIAGNOSIS — R7309 Other abnormal glucose: Secondary | ICD-10-CM | POA: Insufficient documentation

## 2014-03-11 ENCOUNTER — Ambulatory Visit (INDEPENDENT_AMBULATORY_CARE_PROVIDER_SITE_OTHER): Payer: No Typology Code available for payment source

## 2014-03-11 ENCOUNTER — Other Ambulatory Visit: Payer: Self-pay | Admitting: Podiatry

## 2014-03-11 ENCOUNTER — Ambulatory Visit (INDEPENDENT_AMBULATORY_CARE_PROVIDER_SITE_OTHER): Payer: No Typology Code available for payment source | Admitting: Podiatry

## 2014-03-11 ENCOUNTER — Encounter: Payer: Self-pay | Admitting: Podiatry

## 2014-03-11 VITALS — BP 110/59 | HR 87 | Temp 97.7°F | Resp 18

## 2014-03-11 DIAGNOSIS — R52 Pain, unspecified: Secondary | ICD-10-CM

## 2014-03-11 DIAGNOSIS — M898X7 Other specified disorders of bone, ankle and foot: Secondary | ICD-10-CM

## 2014-03-11 DIAGNOSIS — M722 Plantar fascial fibromatosis: Secondary | ICD-10-CM

## 2014-03-11 DIAGNOSIS — M898X9 Other specified disorders of bone, unspecified site: Secondary | ICD-10-CM

## 2014-03-11 DIAGNOSIS — M7660 Achilles tendinitis, unspecified leg: Secondary | ICD-10-CM

## 2014-03-11 MED ORDER — OXYCODONE-ACETAMINOPHEN 10-325 MG PO TABS: 1.0000 | ORAL_TABLET | Freq: Three times a day (TID) | ORAL | Status: DC | PRN

## 2014-03-11 MED ORDER — ETODOLAC ER 400 MG PO TB24: 400.0000 mg | ORAL_TABLET | Freq: Every day | ORAL | Status: DC

## 2014-03-11 NOTE — Progress Notes (Signed)
   Subjective:    Patient ID: Justin [name], male    DOB: 1971-05-20, 43 y.o.   MRN: [id number]  HPI  Justin [name] returns to the office today due to increased pain on the posterior aspect of his heels bilaterally. He states that his been treated by Dr. Al Corpus for continued pain in the heels. He has previously had injections into the area as well as anti-inflammatory and opioid medications prescribed. States that the anti-inflammatory medications decrease his pain and he uses Percocet for breakthrough. He previously has been immobilized in a cam boot which seem to help. He states that over the last week he has noticed increasing pain with the left greater than right. He is scheduled for bariatric surgery at the end of October. He denies any recent injury or trauma to the area. No audible pops or sudden increase in leg pain. No other complaints at this time and no acute changes since last appointment.   Review of Systems  Musculoskeletal:       B/l heel pain       Objective:   Physical Exam AAO x3, NAD DP/PT pulses palpable, CRT < 3sec Protective sensation intact with Simms Weinstein monofilament. Tenderness to palpation of the posterior aspect of the calcaneus at the insertion of the Achilles tendon mostly on the medial posterior aspect. Mild discomfort along the distal insertion of the Achilles tendon. Negative Thompson test and the Achilles tendon appears to be intact. Equinus bilaterally. No overlying erythema, edema, ascending cellulitis, drainage, malodor to the area. Overlying skin intact. Mild tenderness on the plantar medial tubercle of the calcaneus at the insertion of the plantar fascia. No pain along the course of plantar fascial in the arch of the foot. No pain with dorsiflexion/plantarflexion MMT 5/5, ROM WNL No calf swelling, warmth, pain.    Assessment & Plan:  43 year old male with chronic Achilles tendinitis, retrocalcaneal exostosis, morbid obesity -X-rays were obtained and  reviewed with the patient. See x-ray report for full details. -Conservative versus surgical treatment were discussed including alternatives, risks, complications. At this time given patient's obesity and upcoming bariatric surgery cannot proceed with surgical intervention this time. Continue conservative treatment for now. -The patient recently had an injection into the area cannot perform another injection at this time. -Due to increased pain recommend immobilization in a CAM boot for the left side as this is more severe than the right. To help take the pressure off the Achilles will dispensed heel lifts to wear inside the cam boot as well as his shoe on the right side. -Renewed etodolac and percocet. Discussed that he would not be able to take anti-inflammatory medications after bariatric surgery. -Prescribed compound cream to apply to the effected area. -Can start gentle exercises on the right side however would hold off on the left side until the pain has decreased to get over this acute inflammatory state -Limit any high-impact activities. -Followup in 3 weeks or sooner if any palms are to arise or any changes symptoms. In the meantime call any questions, concerns.

## 2014-03-11 NOTE — Patient Instructions (Signed)
Achilles Tendinitis   with Rehab  Achilles tendinitis is a disorder of the Achilles tendon. The Achilles tendon connects the large calf muscles (Gastrocnemius and Soleus) to the heel bone (calcaneus). This tendon is sometimes called the heel cord. It is important for pushing-off and standing on your toes and is important for walking, running, or jumping. Tendinitis is often caused by overuse and repetitive microtrauma.  SYMPTOMS  · Pain, tenderness, swelling, warmth, and redness may occur over the Achilles tendon even at rest.  · Pain with pushing off, or flexing or extending the ankle.  · Pain that is worsened after or during activity.  CAUSES   · Overuse sometimes seen with rapid increase in exercise programs or in sports requiring running and jumping.  · Poor physical conditioning (strength and flexibility or endurance).  · Running sports, especially training running down hills.  · Inadequate warm-up before practice or play or failure to stretch before participation.  · Injury to the tendon.  PREVENTION   · Warm up and stretch before practice or competition.  · Allow time for adequate rest and recovery between practices and competition.  · Keep up conditioning.  ¨ Keep up ankle and leg flexibility.  ¨ Improve or keep muscle strength and endurance.  ¨ Improve cardiovascular fitness.  · Use proper technique.  · Use proper equipment (shoes, skates).  · To help prevent recurrence, taping, protective strapping, or an adhesive bandage may be recommended for several weeks after healing is complete.  PROGNOSIS   · Recovery may take weeks to several months to heal.  · Longer recovery is expected if symptoms have been prolonged.  · Recovery is usually quicker if the inflammation is due to a direct blow as compared with overuse or sudden strain.  RELATED COMPLICATIONS   · Healing time will be prolonged if the condition is not correctly treated. The injury must be given plenty of time to heal.  · Symptoms can reoccur if  activity is resumed too soon.  · Untreated, tendinitis may increase the risk of tendon rupture requiring additional time for recovery and possibly surgery.  TREATMENT   · The first treatment consists of rest anti-inflammatory medication, and ice to relieve the pain.  · Stretching and strengthening exercises after resolution of pain will likely help reduce the risk of recurrence. Referral to a physical therapist or athletic trainer for further evaluation and treatment may be helpful.  · A walking boot or cast may be recommended to rest the Achilles tendon. This can help break the cycle of inflammation and microtrauma.  · Arch supports (orthotics) may be prescribed or recommended by your caregiver as an adjunct to therapy and rest.  · Surgery to remove the inflamed tendon lining or degenerated tendon tissue is rarely necessary and has shown less than predictable results.  MEDICATION   · Nonsteroidal anti-inflammatory medications, such as aspirin and ibuprofen, may be used for pain and inflammation relief. Do not take within 7 days before surgery. Take these as directed by your caregiver. Contact your caregiver immediately if any bleeding, stomach upset, or signs of allergic reaction occur. Other minor pain relievers, such as acetaminophen, may also be used.  · Pain relievers may be prescribed as necessary by your caregiver. Do not take prescription pain medication for longer than 4 to 7 days. Use only as directed and only as much as you need.  · Cortisone injections are rarely indicated. Cortisone injections may weaken tendons and predispose to rupture. It is better   to give the condition more time to heal than to use them.  HEAT AND COLD  · Cold is used to relieve pain and reduce inflammation for acute and chronic Achilles tendinitis. Cold should be applied for 10 to 15 minutes every 2 to 3 hours for inflammation and pain and immediately after any activity that aggravates your symptoms. Use ice packs or an ice  massage.  · Heat may be used before performing stretching and strengthening activities prescribed by your caregiver. Use a heat pack or a warm soak.  SEEK MEDICAL CARE IF:  · Symptoms get worse or do not improve in 2 weeks despite treatment.  · New, unexplained symptoms develop. Drugs used in treatment may produce side effects.  EXERCISES  RANGE OF MOTION (ROM) AND STRETCHING EXERCISES - Achilles Tendinitis   These exercises may help you when beginning to rehabilitate your injury. Your symptoms may resolve with or without further involvement from your physician, physical therapist or athletic trainer. While completing these exercises, remember:   · Restoring tissue flexibility helps normal motion to return to the joints. This allows healthier, less painful movement and activity.  · An effective stretch should be held for at least 30 seconds.  · A stretch should never be painful. You should only feel a gentle lengthening or release in the stretched tissue.  STRETCH - Gastroc, Standing   · Place hands on wall.  · Extend right / left leg, keeping the front knee somewhat bent.  · Slightly point your toes inward on your back foot.  · Keeping your right / left heel on the floor and your knee straight, shift your weight toward the wall, not allowing your back to arch.  · You should feel a gentle stretch in the right / left calf. Hold this position for __________ seconds.  Repeat __________ times. Complete this stretch __________ times per day.  STRETCH - Soleus, Standing   · Place hands on wall.  · Extend right / left leg, keeping the other knee somewhat bent.  · Slightly point your toes inward on your back foot.  · Keep your right / left heel on the floor, bend your back knee, and slightly shift your weight over the back leg so that you feel a gentle stretch deep in your back calf.  · Hold this position for __________ seconds.  Repeat __________ times. Complete this stretch __________ times per day.  STRETCH -  Gastrocsoleus, Standing   Note: This exercise can place a lot of stress on your foot and ankle. Please complete this exercise only if specifically instructed by your caregiver.   · Place the ball of your right / left foot on a step, keeping your other foot firmly on the same step.  · Hold on to the wall or a rail for balance.  · Slowly lift your other foot, allowing your body weight to press your heel down over the edge of the step.  · You should feel a stretch in your right / left calf.  · Hold this position for __________ seconds.  · Repeat this exercise with a slight bend in your knee.  Repeat __________ times. Complete this stretch __________ times per day.   STRENGTHENING EXERCISES - Achilles Tendinitis  These exercises may help you when beginning to rehabilitate your injury. They may resolve your symptoms with or without further involvement from your physician, physical therapist or athletic trainer. While completing these exercises, remember:   · Muscles can gain both the endurance   and the strength needed for everyday activities through controlled exercises.  · Complete these exercises as instructed by your physician, physical therapist or athletic trainer. Progress the resistance and repetitions only as guided.  · You may experience muscle soreness or fatigue, but the pain or discomfort you are trying to eliminate should never worsen during these exercises. If this pain does worsen, stop and make certain you are following the directions exactly. If the pain is still present after adjustments, discontinue the exercise until you can discuss the trouble with your clinician.  STRENGTH - Plantar-flexors   · Sit with your right / left leg extended. Holding onto both ends of a rubber exercise band/tubing, loop it around the ball of your foot. Keep a slight tension in the band.  · Slowly push your toes away from you, pointing them downward.  · Hold this position for __________ seconds. Return slowly, controlling the  tension in the band/tubing.  Repeat __________ times. Complete this exercise __________ times per day.   STRENGTH - Plantar-flexors   · Stand with your feet shoulder width apart. Steady yourself with a wall or table using as little support as needed.  · Keeping your weight evenly spread over the width of your feet, rise up on your toes.*  · Hold this position for __________ seconds.  Repeat __________ times. Complete this exercise __________ times per day.   *If this is too easy, shift your weight toward your right / left leg until you feel challenged. Ultimately, you may be asked to do this exercise with your right / left foot only.  STRENGTH - Plantar-flexors, Eccentric   Note: This exercise can place a lot of stress on your foot and ankle. Please complete this exercise only if specifically instructed by your caregiver.   · Place the balls of your feet on a step. With your hands, use only enough support from a wall or rail to keep your balance.  · Keep your knees straight and rise up on your toes.  · Slowly shift your weight entirely to your right / left toes and pick up your opposite foot. Gently and with controlled movement, lower your weight through your right / left foot so that your heel drops below the level of the step. You will feel a slight stretch in the back of your calf at the end position.  · Use the healthy leg to help rise up onto the balls of both feet, then lower weight only on the right / left leg again. Build up to 15 repetitions. Then progress to 3 consecutive sets of 15 repetitions.*  · After completing the above exercise, complete the same exercise with a slight knee bend (about 30 degrees). Again, build up to 15 repetitions. Then progress to 3 consecutive sets of 15 repetitions.*  Perform this exercise __________ times per day.   *When you easily complete 3 sets of 15, your physician, physical therapist or athletic trainer may advise you to add resistance by wearing a backpack filled with  additional weight.  STRENGTH - Plantar Flexors, Seated   · Sit on a chair that allows your feet to rest flat on the ground. If necessary, sit at the edge of the chair.  · Keeping your toes firmly on the ground, lift your right / left heel as far as you can without increasing any discomfort in your ankle.  Repeat __________ times. Complete this exercise __________ times a day.  *If instructed by your physician, physical therapist or athletic   trainer, you may add ____________________ of resistance by placing a weighted object on your right / left knee.  Document Released: 01/04/2005 Document Revised: 08/28/2011 Document Reviewed: 09/17/2008  [employer]® Patient Information ©2015 [employer]. This information is not intended to replace advice given to you by your health care provider. Make sure you discuss any questions you have with your health care provider.

## 2014-03-12 ENCOUNTER — Encounter: Payer: Self-pay | Admitting: Podiatry

## 2014-03-18 ENCOUNTER — Other Ambulatory Visit: Payer: Self-pay | Admitting: *Deleted

## 2014-03-19 MED ORDER — ATORVASTATIN CALCIUM 80 MG PO TABS: 80.0000 mg | ORAL_TABLET | Freq: Every day | ORAL | Status: DC

## 2014-03-24 ENCOUNTER — Telehealth: Payer: Self-pay | Admitting: Home Health Services

## 2014-03-26 NOTE — Telephone Encounter (Signed)
Justin [name] called to report on how he and his family are doing on behavior changes for weight loss.  Pt reports still continuing to write weekly meal plans and cooking more at home.   Pt reports having stopped drinking most sweet drinks.  Reports tracking food daily and has started to incorporate more movement into his daily life.  Pt said family is still doing well with diet and that he has completed all the steps to get bariatric surgery.  He is still waiting for surgery schedule date.  Goal for the next 2-4 weeks is to continue with weekly meal planning and tracking food.  Pt is considering starting a protein shake once a day per discussion with dietician.

## 2014-04-01 ENCOUNTER — Ambulatory Visit: Payer: No Typology Code available for payment source | Admitting: Podiatry

## 2014-04-02 ENCOUNTER — Ambulatory Visit (INDEPENDENT_AMBULATORY_CARE_PROVIDER_SITE_OTHER): Payer: No Typology Code available for payment source | Admitting: Podiatry

## 2014-04-02 VITALS — BP 110/62 | HR 88 | Resp 20

## 2014-04-02 DIAGNOSIS — M898X7 Other specified disorders of bone, ankle and foot: Secondary | ICD-10-CM

## 2014-04-02 DIAGNOSIS — M722 Plantar fascial fibromatosis: Secondary | ICD-10-CM

## 2014-04-02 DIAGNOSIS — M7662 Achilles tendinitis, left leg: Secondary | ICD-10-CM

## 2014-04-02 DIAGNOSIS — M7661 Achilles tendinitis, right leg: Secondary | ICD-10-CM

## 2014-04-02 DIAGNOSIS — M2570 Osteophyte, unspecified joint: Secondary | ICD-10-CM

## 2014-04-03 NOTE — Progress Notes (Signed)
He presents today for followup of his tendo Achilles tendinitis bilaterally. He states that by the end of the day they are killing him. He states that in the near future he should be cleared for his gastric bypass procedure and that he expects this will help his heel pain considerably.  Objective: Vital signs are stable he is alert and oriented x3. Pulses are palpable. Tendo Achilles is tight and painful. Erythema and edema to the posterior aspect of the bilateral heel. I did review previous radiographs taken which demonstrate large posterior calcaneal heel spur and thickening of the tendo Achilles.  Assessment: Retrocalcaneal tendinitis with overlying bursitis.  Plan: Injected the subcutaneous bursa today with 20 mg of dexamethasone to the bilateral heel. I will followup with him in 6 weeks or as needed.

## 2014-04-13 ENCOUNTER — Emergency Department (HOSPITAL_COMMUNITY): Payer: No Typology Code available for payment source

## 2014-04-13 ENCOUNTER — Emergency Department (HOSPITAL_COMMUNITY)
Admission: EM | Admit: 2014-04-13 | Discharge: 2014-04-13 | Disposition: A | Discharge status: Home / Self Care | Payer: No Typology Code available for payment source | Attending: Emergency Medicine | Admitting: Emergency Medicine

## 2014-04-13 ENCOUNTER — Encounter (HOSPITAL_COMMUNITY): Payer: Self-pay | Admitting: Emergency Medicine

## 2014-04-13 DIAGNOSIS — R740 Nonspecific elevation of levels of transaminase and lactic acid dehydrogenase [LDH]: Secondary | ICD-10-CM | POA: Insufficient documentation

## 2014-04-13 DIAGNOSIS — R0602 Shortness of breath: Secondary | ICD-10-CM

## 2014-04-13 DIAGNOSIS — R509 Fever, unspecified: Secondary | ICD-10-CM | POA: Diagnosis not present

## 2014-04-13 DIAGNOSIS — Z87442 Personal history of urinary calculi: Secondary | ICD-10-CM | POA: Diagnosis not present

## 2014-04-13 DIAGNOSIS — I10 Essential (primary) hypertension: Secondary | ICD-10-CM | POA: Insufficient documentation

## 2014-04-13 DIAGNOSIS — R109 Unspecified abdominal pain: Secondary | ICD-10-CM | POA: Insufficient documentation

## 2014-04-13 DIAGNOSIS — Z9889 Other specified postprocedural states: Secondary | ICD-10-CM | POA: Diagnosis not present

## 2014-04-13 DIAGNOSIS — R7401 Elevation of levels of liver transaminase levels: Secondary | ICD-10-CM

## 2014-04-13 DIAGNOSIS — Z7982 Long term (current) use of aspirin: Secondary | ICD-10-CM | POA: Insufficient documentation

## 2014-04-13 DIAGNOSIS — Z87448 Personal history of other diseases of urinary system: Secondary | ICD-10-CM | POA: Insufficient documentation

## 2014-04-13 DIAGNOSIS — G4733 Obstructive sleep apnea (adult) (pediatric): Secondary | ICD-10-CM | POA: Diagnosis not present

## 2014-04-13 DIAGNOSIS — Z79899 Other long term (current) drug therapy: Secondary | ICD-10-CM | POA: Diagnosis not present

## 2014-04-13 DIAGNOSIS — Z9981 Dependence on supplemental oxygen: Secondary | ICD-10-CM | POA: Diagnosis not present

## 2014-04-13 LAB — CBC WITH DIFFERENTIAL/PLATELET
Basophils Absolute: 0 10*3/uL (ref 0.0–0.1)
Basophils Relative: 1 % (ref 0–1)
EOS ABS: 0.4 10*3/uL (ref 0.0–0.7)
Eosinophils Relative: 10 % — ABNORMAL HIGH (ref 0–5)
HCT: 42.2 % (ref 39.0–52.0)
HEMOGLOBIN: 14.2 g/dL (ref 13.0–17.0)
Lymphocytes Relative: 29 % (ref 12–46)
Lymphs Abs: 1 10*3/uL (ref 0.7–4.0)
MCH: 30 pg (ref 26.0–34.0)
MCHC: 33.6 g/dL (ref 30.0–36.0)
MCV: 89.2 fL (ref 78.0–100.0)
Monocytes Absolute: 0.5 10*3/uL (ref 0.1–1.0)
Monocytes Relative: 14 % — ABNORMAL HIGH (ref 3–12)
NEUTROS PCT: 46 % (ref 43–77)
Neutro Abs: 1.7 10*3/uL (ref 1.7–7.7)
PLATELETS: 214 10*3/uL (ref 150–400)
RBC: 4.73 MIL/uL (ref 4.22–5.81)
RDW: 14.6 % (ref 11.5–15.5)
WBC: 3.6 10*3/uL — AB (ref 4.0–10.5)

## 2014-04-13 LAB — URINALYSIS, ROUTINE W REFLEX MICROSCOPIC
BILIRUBIN URINE: NEGATIVE
Glucose, UA: NEGATIVE mg/dL
HGB URINE DIPSTICK: NEGATIVE
Ketones, ur: NEGATIVE mg/dL
Leukocytes, UA: NEGATIVE
Nitrite: NEGATIVE
Protein, ur: NEGATIVE mg/dL
SPECIFIC GRAVITY, URINE: 1.023 (ref 1.005–1.030)
Urobilinogen, UA: 0.2 mg/dL (ref 0.0–1.0)
pH: 6 (ref 5.0–8.0)

## 2014-04-13 LAB — COMPREHENSIVE METABOLIC PANEL
ALBUMIN: 3.4 g/dL — AB (ref 3.5–5.2)
ALK PHOS: 99 U/L (ref 39–117)
ALT: 83 U/L — AB (ref 0–53)
AST: 73 U/L — ABNORMAL HIGH (ref 0–37)
Anion gap: 13 (ref 5–15)
BILIRUBIN TOTAL: 0.2 mg/dL — AB (ref 0.3–1.2)
BUN: 17 mg/dL (ref 6–23)
CHLORIDE: 99 meq/L (ref 96–112)
CO2: 26 mEq/L (ref 19–32)
Calcium: 8.9 mg/dL (ref 8.4–10.5)
Creatinine, Ser: 1.2 mg/dL (ref 0.50–1.35)
GFR calc Af Amer: 85 mL/min — ABNORMAL LOW (ref >90)
GFR calc non Af Amer: 74 mL/min — ABNORMAL LOW (ref >90)
Glucose, Bld: 96 mg/dL (ref 70–99)
Potassium: 4 mEq/L (ref 3.7–5.3)
SODIUM: 138 meq/L (ref 137–147)
TOTAL PROTEIN: 7 g/dL (ref 6.0–8.3)

## 2014-04-13 LAB — LIPASE, BLOOD: Lipase: 23 U/L (ref 11–59)

## 2014-04-13 NOTE — ED Notes (Addendum)
Per pt sts recently started on Lamictal and tegretol 2 weeks ago. sts he has been having fever, chills, body aches. sts stopped taking the medication Saturday because was told it could be side effects. Fever at triage. sts some LUQ and flank pain and SOB

## 2014-04-13 NOTE — ED Provider Notes (Signed)
CSN: [id number]     Arrival date & time 04/13/14  1546 History   First MD Initiated Contact with Patient 04/13/14 2236     Chief Complaint  Patient presents with  . Fever  . Chills  . Flank Pain     (Consider location/radiation/quality/duration/timing/severity/associated sxs/prior Treatment) Patient is a 43 y.o. male presenting with fever and flank pain. The history is provided by the patient.  Fever Flank Pain  He comes in with a four-day history of fever and chills. Temperature is been low-grade up to 100. He had mild aching in the left upper abdomen and left flank 3 days ago but that resolved fairly quickly. He denies sore throat or cough. He denies abdominal pain other than what is noted above, nausea, vomiting, diarrhea. There've been no urinary symptoms. He denies arthralgias or myalgias. He denies any sick contacts. He had been started on carbamazepine and lamotrigine 2 weeks ago and noted that fever and chills were possible side effects of this medication so he stopped them when he started getting sick. He has been taking over-the-counter antipyretics which to give temporary relief.  Past Medical History  Diagnosis Date  . Nephrolithiasis   . Obese   . Hypertension   . Kidney stones   . OSA on CPAP    Past Surgical History  Procedure Laterality Date  . Cardiac catheterization  08/2013    normal arteries/function   Family History  Problem Relation Age of Onset  . Coronary artery disease Mother 37    MI  . Diabetes Mother   . Stroke Mother     ~2012-Deceased   History  Substance Use Topics  . Smoking status: Never Smoker   . Smokeless tobacco: Never Used  . Alcohol Use: No    Review of Systems  Constitutional: Positive for fever.  Genitourinary: Positive for flank pain.  All other systems reviewed and are negative.     Allergies  Sulfonamide derivatives  Home Medications   Prior to Admission medications   Medication Sig Start Date End Date Taking?  Authorizing Provider  albuterol (PROVENTIL HFA;VENTOLIN HFA) 108 (90 BASE) MCG/ACT inhaler Inhale 1 puff into the lungs every 6 (six) hours as needed for wheezing or shortness of breath.   Yes Historical Provider, MD  allopurinol (ZYLOPRIM) 300 MG tablet Take 300 mg by mouth daily.   Yes Historical Provider, MD  aspirin EC 81 MG tablet Take 81 mg by mouth daily.   Yes Historical Provider, MD  atorvastatin (LIPITOR) 80 MG tablet Take 80 mg by mouth every evening.   Yes Historical Provider, MD  carvedilol (COREG) 6.25 MG tablet Take 6.25 mg by mouth 2 (two) times daily with a meal.   Yes Historical Provider, MD  Cholecalciferol (VITAMIN D PO) Take 1 tablet by mouth daily.   Yes Historical Provider, MD  Cranberry-Vitamin C-Probiotic (AZO CRANBERRY PO) Take 2 tablets by mouth daily.   Yes Historical Provider, MD  etodolac (LODINE) 400 MG tablet Take 400 mg by mouth daily.   Yes Historical Provider, MD  furosemide (LASIX) 20 MG tablet Take 20 mg by mouth daily as needed for fluid. To get rid of some of the fluid on your legs 07/05/11  Yes Carney Living, MD  lisinopril-hydrochlorothiazide (PRINZIDE,ZESTORETIC) 20-25 MG per tablet Take 1 tablet by mouth daily.   Yes Historical Provider, MD  Multiple Vitamin (MULTIVITAMIN WITH MINERALS) TABS Take 1 tablet by mouth daily.   Yes Historical Provider, MD  nitroGLYCERIN (NITROSTAT) 0.4 MG SL  tablet Place 0.4 mg under the tongue every 5 (five) minutes as needed for chest pain.   Yes Historical Provider, MD  oxyCODONE-acetaminophen (PERCOCET) 10-325 MG per tablet Take 1 tablet by mouth every 6 (six) hours as needed for pain.   Yes Historical Provider, MD  Zinc 50 MG TABS Take 1 tablet by mouth daily.    Yes Historical Provider, MD  carbamazepine (TEGRETOL) 100 MG chewable tablet Chew 100 mg by mouth 2 (two) times daily.    Historical Provider, MD  lamoTRIgine (LAMICTAL) 100 MG tablet Take 100 mg by mouth daily.    Historical Provider, MD   BP 125/56  Pulse  97  Temp(Src) 99 F (37.2 C) (Oral)  Resp 22  Wt 407 lb 6 oz (184.784 kg)  SpO2 93% Physical Exam  Nursing note and vitals reviewed.  Morbidly obese 43 year old male, resting comfortably and in no acute distress. Vital signs are significant for mild tachypnea. Oxygen saturation is 93%, which is normal. Head is normocephalic and atraumatic. PERRLA, EOMI. Oropharynx is clear. Neck is nontender and supple without adenopathy or JVD. Back is nontender and there is no CVA tenderness. Lungs are clear without rales, wheezes, or rhonchi. Chest is nontender. Heart has regular rate and rhythm without murmur. Abdomen is soft, flat, nontender without masses or hepatosplenomegaly and peristalsis is normoactive. Extremities have 1+ edema, full range of motion is present. Skin is warm and dry without rash. Neurologic: Mental status is normal, cranial nerves are intact, there are no motor or sensory deficits.  ED Course  Procedures (including critical care time) Labs Review Results for orders placed during the hospital encounter of 04/13/14  CBC WITH DIFFERENTIAL      Result Value Ref Range   WBC 3.6 (*) 4.0 - 10.5 K/uL   RBC 4.73  4.22 - 5.81 MIL/uL   Hemoglobin 14.2  13.0 - 17.0 g/dL   HCT 40.942.2  81.139.0 - 91.452.0 %   MCV 89.2  78.0 - 100.0 fL   MCH 30.0  26.0 - 34.0 pg   MCHC 33.6  30.0 - 36.0 g/dL   RDW 78.214.6  95.611.5 - 21.315.5 %   Platelets 214  150 - 400 K/uL   Neutrophils Relative % 46  43 - 77 %   Neutro Abs 1.7  1.7 - 7.7 K/uL   Lymphocytes Relative 29  12 - 46 %   Lymphs Abs 1.0  0.7 - 4.0 K/uL   Monocytes Relative 14 (*) 3 - 12 %   Monocytes Absolute 0.5  0.1 - 1.0 K/uL   Eosinophils Relative 10 (*) 0 - 5 %   Eosinophils Absolute 0.4  0.0 - 0.7 K/uL   Basophils Relative 1  0 - 1 %   Basophils Absolute 0.0  0.0 - 0.1 K/uL  COMPREHENSIVE METABOLIC PANEL      Result Value Ref Range   Sodium 138  137 - 147 mEq/L   Potassium 4.0  3.7 - 5.3 mEq/L   Chloride 99  96 - 112 mEq/L   CO2 26  19 -  32 mEq/L   Glucose, Bld 96  70 - 99 mg/dL   BUN 17  6 - 23 mg/dL   Creatinine, Ser 0.861.20  0.50 - 1.35 mg/dL   Calcium 8.9  8.4 - 57.810.5 mg/dL   Total Protein 7.0  6.0 - 8.3 g/dL   Albumin 3.4 (*) 3.5 - 5.2 g/dL   AST 73 (*) 0 - 37 U/L   ALT 83 (*)  0 - 53 U/L   Alkaline Phosphatase 99  39 - 117 U/L   Total Bilirubin 0.2 (*) 0.3 - 1.2 mg/dL   GFR calc non Af Amer 74 (*) >90 mL/min   GFR calc Af Amer 85 (*) >90 mL/min   Anion gap 13  5 - 15  LIPASE, BLOOD      Result Value Ref Range   Lipase 23  11 - 59 U/L  URINALYSIS, ROUTINE W REFLEX MICROSCOPIC      Result Value Ref Range   Color, Urine YELLOW  YELLOW   APPearance CLEAR  CLEAR   Specific Gravity, Urine 1.023  1.005 - 1.030   pH 6.0  5.0 - 8.0   Glucose, UA NEGATIVE  NEGATIVE mg/dL   Hgb urine dipstick NEGATIVE  NEGATIVE   Bilirubin Urine NEGATIVE  NEGATIVE   Ketones, ur NEGATIVE  NEGATIVE mg/dL   Protein, ur NEGATIVE  NEGATIVE mg/dL   Urobilinogen, UA 0.2  0.0 - 1.0 mg/dL   Nitrite NEGATIVE  NEGATIVE   Leukocytes, UA NEGATIVE  NEGATIVE   Imaging Review Dg Chest 2 View  04/13/2014   CLINICAL DATA:  Shortness of breath.  Initial encounter  EXAM: CHEST  2 VIEW  COMPARISON:  None available during down time  FINDINGS: Borderline cardiomegaly. Negative hila and aortic contours. No acute infiltrate or edema. No effusion or pneumothorax. No acute osseous findings.  IMPRESSION: No active cardiopulmonary disease.   Electronically Signed   By: Tiburcio PeaJonathan  Watts M.D.   On: 04/13/2014 21:34    Date: 04/13/2014  Rate: 99  Rhythm: normal sinus rhythm  QRS Axis: normal  Intervals: normal  ST/T Wave abnormalities: normal  Conduction Disutrbances:none  Narrative Interpretation:  Possible left ventricular hypertrophy, otherwise normal ECG. When compared with ECG of 09/18/2013, no significant changes are seen.  Old EKG Reviewed: unchanged   MDM   Final diagnoses:  SOB (shortness of breath)    Fever and chills which are probably part of  a viral syndrome. He has not received his influenza immunization but antiviral treatment would not be appropriate for influenza since he has been ill for 4 days. Mild elevation of transaminases noted. He has a mild leukopenia with a slight right shift consistent with viral illness. I doubt that this is leukopenia from lamotrigine or carbamazepine. However, he is advised to not take those medications until he has completely recovered. Mild elevation in transaminases could be due to a viral infection such as viral hepatitis but could also be a side effect of lamotrigine or carbamazepine. This will need to be rechecked as an outpatient. All this was discussed with patient and his wife. He is scheduled to have bariatric surgery done in slightly more than a month. Given that, he probably should not be started back on either the medications until he has recovered from his bariatric surgery.    Dione Boozeavid Devyon Keator, MD 04/13/14 [phone]

## 2014-04-13 NOTE — Discharge Instructions (Signed)
Take ibuprofen as needed for fever. See your doctor in two weeks to recheck your liver tests.  Fever, Adult A fever is a higher than normal body temperature. In an adult, an oral temperature around 98.6 F (37 C) is considered normal. A temperature of 100.4 F (38 C) or higher is generally considered a fever. Mild or moderate fevers generally have no long-term effects and often do not require treatment. Extreme fever (greater than or equal to 106 F or 41.1 C) can cause seizures. The sweating that may occur with repeated or prolonged fever may cause dehydration. Elderly people can develop confusion during a fever. A measured temperature can vary with:  Age.  Time of day.  Method of measurement (mouth, underarm, rectal, or ear). The fever is confirmed by taking a temperature with a thermometer. Temperatures can be taken different ways. Some methods are accurate and some are not.  An oral temperature is used most commonly. Electronic thermometers are fast and accurate.  An ear temperature will only be accurate if the thermometer is positioned as recommended by the manufacturer.  A rectal temperature is accurate and done for those adults who have a condition where an oral temperature cannot be taken.  An underarm (axillary) temperature is not accurate and not recommended. Fever is a symptom, not a disease.  CAUSES   Infections commonly cause fever.  Some noninfectious causes for fever include:  Some arthritis conditions.  Some thyroid or adrenal gland conditions.  Some immune system conditions.  Some types of cancer.  A medicine reaction.  High doses of certain street drugs such as methamphetamine.  Dehydration.  Exposure to high outside or room temperatures.  Occasionally, the source of a fever cannot be determined. This is sometimes called a "fever of unknown origin" (FUO).  Some situations may lead to a temporary rise in body temperature that may go away on its own.  Examples are:  Childbirth.  Surgery.  Intense exercise. HOME CARE INSTRUCTIONS   Take appropriate medicines for fever. Follow dosing instructions carefully. If you use acetaminophen to reduce the fever, be careful to avoid taking other medicines that also contain acetaminophen. Do not take aspirin for a fever if you are younger than age 41. There is an association with Reye's syndrome. Reye's syndrome is a rare but potentially deadly disease.  If an infection is present and antibiotics have been prescribed, take them as directed. Finish them even if you start to feel better.  Rest as needed.  Maintain an adequate fluid intake. To prevent dehydration during an illness with prolonged or recurrent fever, you may need to drink extra fluid.Drink enough fluids to keep your urine clear or pale yellow.  Sponging or bathing with room temperature water may help reduce body temperature. Do not use ice water or alcohol sponge baths.  Dress comfortably, but do not over-bundle. SEEK MEDICAL CARE IF:   You are unable to keep fluids down.  You develop vomiting or diarrhea.  You are not feeling at least partly better after 3 days.  You develop new symptoms or problems. SEEK IMMEDIATE MEDICAL CARE IF:   You have shortness of breath or trouble breathing.  You develop excessive weakness.  You are dizzy or you faint.  You are extremely thirsty or you are making little or no urine.  You develop new pain that was not there before (such as in the head, neck, chest, back, or abdomen).  You have persistent vomiting and diarrhea for more than 1 to 2  days.  You develop a stiff neck or your eyes become sensitive to light.  You develop a skin rash.  You have a fever or persistent symptoms for more than 2 to 3 days.  You have a fever and your symptoms suddenly get worse. MAKE SURE YOU:   Understand these instructions.  Will watch your condition.  Will get help right away if you are not  doing well or get worse. Document Released: 11/29/2000 Document Revised: 10/20/2013 Document Reviewed: 04/06/2011 [facility] Patient Information 2015 [place], [place]. This information is not intended to replace advice given to you by your health care provider. Make sure you discuss any questions you have with your health care provider.  Viral Infections A virus is a type of germ. Viruses can cause:  Minor sore throats.  Aches and pains.  Headaches.  Runny nose.  Rashes.  Watery eyes.  Tiredness.  Coughs.  Loss of appetite.  Feeling sick to your stomach (nausea).  Throwing up (vomiting).  Watery poop (diarrhea). HOME CARE   Only take medicines as told by your doctor.  Drink enough water and fluids to keep your pee (urine) clear or pale yellow. Sports drinks are a good choice.  Get plenty of rest and eat healthy. Soups and broths with crackers or rice are fine. GET HELP RIGHT AWAY IF:   You have a very bad headache.  You have shortness of breath.  You have chest pain or neck pain.  You have an unusual rash.  You cannot stop throwing up.  You have watery poop that does not stop.  You cannot keep fluids down.  You or your child has a temperature by mouth above 102 F (38.9 C), not controlled by medicine.  Your baby is older than 3 months with a rectal temperature of 102 F (38.9 C) or higher.  Your baby is 323 months old or younger with a rectal temperature of 100.4 F (38 C) or higher. MAKE SURE YOU:   Understand these instructions.  Will watch this condition.  Will get help right away if you are not doing well or get worse. Document Released: 05/18/2008 Document Revised: 08/28/2011 Document Reviewed: 10/11/2010 [facility]Exit[facility] Patient Information 2015 [place], [place]. This information is not intended to replace advice given to you by your health care provider. Make sure you discuss any questions you have with your health care provider.

## 2014-04-15 ENCOUNTER — Other Ambulatory Visit (INDEPENDENT_AMBULATORY_CARE_PROVIDER_SITE_OTHER): Payer: Self-pay | Admitting: General Surgery

## 2014-04-23 ENCOUNTER — Encounter: Payer: Self-pay | Admitting: Family Medicine

## 2014-04-23 ENCOUNTER — Ambulatory Visit (INDEPENDENT_AMBULATORY_CARE_PROVIDER_SITE_OTHER): Payer: No Typology Code available for payment source | Admitting: Family Medicine

## 2014-04-23 ENCOUNTER — Ambulatory Visit (INDEPENDENT_AMBULATORY_CARE_PROVIDER_SITE_OTHER): Payer: No Typology Code available for payment source | Admitting: *Deleted

## 2014-04-23 VITALS — BP 150/74 | HR 80 | Temp 98.2°F | Ht 69.0 in | Wt >= 6400 oz

## 2014-04-23 DIAGNOSIS — R7401 Elevation of levels of liver transaminase levels: Secondary | ICD-10-CM | POA: Insufficient documentation

## 2014-04-23 DIAGNOSIS — R740 Nonspecific elevation of levels of transaminase and lactic acid dehydrogenase [LDH]: Secondary | ICD-10-CM

## 2014-04-23 DIAGNOSIS — D72819 Decreased white blood cell count, unspecified: Secondary | ICD-10-CM | POA: Insufficient documentation

## 2014-04-23 DIAGNOSIS — Z23 Encounter for immunization: Secondary | ICD-10-CM

## 2014-04-23 DIAGNOSIS — F411 Generalized anxiety disorder: Secondary | ICD-10-CM

## 2014-04-23 LAB — CBC
HEMATOCRIT: 40.8 % (ref 39.0–52.0)
Hemoglobin: 14.4 g/dL (ref 13.0–17.0)
MCH: 30.6 pg (ref 26.0–34.0)
MCHC: 35.3 g/dL (ref 30.0–36.0)
MCV: 86.8 fL (ref 78.0–100.0)
Platelets: 407 10*3/uL — ABNORMAL HIGH (ref 150–400)
RBC: 4.7 MIL/uL (ref 4.22–5.81)
RDW: 15.3 % (ref 11.5–15.5)
WBC: 9 10*3/uL (ref 4.0–10.5)

## 2014-04-23 LAB — COMPREHENSIVE METABOLIC PANEL
ALT: 55 U/L — AB (ref 0–53)
AST: 21 U/L (ref 0–37)
Albumin: 3.9 g/dL (ref 3.5–5.2)
Alkaline Phosphatase: 96 U/L (ref 39–117)
BUN: 16 mg/dL (ref 6–23)
CO2: 29 meq/L (ref 19–32)
CREATININE: 1.01 mg/dL (ref 0.50–1.35)
Calcium: 9.5 mg/dL (ref 8.4–10.5)
Chloride: 103 mEq/L (ref 96–112)
Glucose, Bld: 98 mg/dL (ref 70–99)
Potassium: 4.1 mEq/L (ref 3.5–5.3)
Sodium: 139 mEq/L (ref 135–145)
Total Bilirubin: 0.5 mg/dL (ref 0.2–1.2)
Total Protein: 6.5 g/dL (ref 6.0–8.3)

## 2014-04-23 NOTE — Patient Instructions (Signed)
We are checking labs and will call if any are NOT normal. Seek immediate care for trouble breathing, fevers, chills, or other concerns. Also seek immediate care if you start having worsening of bipolar symptoms. Otherwise, follow up regularly with your psychiatrist and with your primary doctor.  Best,  Leona SingletonMaria T Meesha Sek, MD

## 2014-04-23 NOTE — Progress Notes (Signed)
Subjective:     Patient ID: Alphia MohWilliam C Hyun, male   DOB: 08-29-1970, 43 y.o.   MRN: [id number]  HPI Mr. [name] is a pleasant white male who was recently discharged from the hospital for adverse side effects from Lamictal and Tegratol. He was started on these two drugs by his psychiatrist for his newly diagnosed bipolar disorder. Two weeks into taking the drugs he developed, fevers, chills, sweats, diffuse body aches and dizziness which lasted for 3-4 days after which he contacted his psychiatrist who advised him to stop his medication. After stopping the medication he continued to have the symptoms and went to the ED where he was diagnosed with a viral illness in addition to leukopenia. He is coming in today for lab work to follow up on his liver enzymes and blood count. He is scheduled for pre-op for his gastric bypass surgery on Nov. 19th. He is currently hypertensive and will need to be seen for hypertension follow up. He is requesting flu shot today.  Review of Systems Pt denies fevers, chills,  HA, blurry vision, CP, SOB, coughing, N/V, C/D. No positive ROS.     Objective:   Physical Exam Morbidly obese pleasant male.  HEENT: Normocephalic, atraumatic.  Cardio: RRR, no rubs, murmurs or gallops. Pulm: non-labored breathing, CTA bilaterally Psych: pleasant, alert and oriented x4, appropriate affect     Assessment:     1. Adverse drug reaction - Lekopenia and elevated liver enzymes. Pt is to stop lamictal and tegratol 2. HTN- Pt's BP in office first reading 150/74. Second reading 140/80. Pt states second BP reading is reflective of home BP checks. 3. Morbid obesity- Pt is scheduled for gastric bypass in Dec of 2014.     Plan:     1. CBC, CMET today to recheck lab values. 2. Pt is to follow up on BP for possible medication adjustment. Continue current medication and document BP reading at home until able to be seen.  3. Roux-en-y gastric bypass in Dec. Pt has pre-op Nov 19th. Continue diet  and exercise.  4. Health maintenance - flu shot today.

## 2014-04-23 NOTE — Progress Notes (Signed)
Patient ID: Justin MohWilliam C Filley, male   DOB: [birth year], 43 y.o.   MRN: [id number] Subjective:   CC: Hospital follow up  HPI:   Patient presents as ED follow up for symptoms that had been present for 2 weeks and included fevers, chills, and body aches. Prior to going to ED, patient contacted on-call psychiatrist who recommended stopping his recently started lamictal and tegretol (for bipolar disorder). Symptoms did not improve so he visited the ED 10/26 where he was found to have transaminitis and leukopenia. This was thought most likely drug reaction but there was also consideration of viral syndrome. Patient states he has had hepatitis immunization as a child and these were not checked. He has now been feeling better close to 1 week and has remained off lamictal and tegretol. He denies current fevers, chills, pain, chest pain, dyspnea, cough, nausea, vomiting, diarrhea, headaches, or blurred vision.  He has not yet had a flu shot but would like that today.   Review of Systems - Per HPI.   PMH - anxiety, HTN, gout, ventral hernia, risk for CAD, morbid obesity. Meds: Reviewed    Objective:  Physical Exam Ht 5\' 9"  (1.753 m)  Wt 408 lb (185.068 kg)  BMI 60.22 kg/m2 GEN: NAD, morbidly obese CV: 2+ bilateral radial pulse with regular rate and rhythm PULM: Normal effort ABD: s/nt/nd, morbidly obese, unable to meaningfully examine for organs due to limitations of habitus    Assessment:     Justin [name] is a 43 y.o. male with h/o morbid obesity, HTN, and gout here for f/u of transaminitis thought due to medications.    Plan:     # See problem list and after visit summary for problem-specific plans.   # Health Maintenance: Flu shot today. Bariatric surgery planned in early Dec.  Follow-up: Follow up when available for HTN f/u with PCP. 150/74 today, improved on recheck to 140/80.  Leona SingletonMaria T Shelley Cocke, MD [facility]

## 2014-04-24 ENCOUNTER — Encounter: Payer: Self-pay | Admitting: Family Medicine

## 2014-04-24 NOTE — Assessment & Plan Note (Signed)
Likely related to tegretal and lamictal, which were stopped prior to ED visit 10/26 and pt has remained off. - Recheck CMET and CBC today. If LFTs not improving, will further evaluate. - Continue to hold medications and not restart any others until after planned bariatric surgery 12/8. - Pt reports no worsening of his bipolar disorder.

## 2014-04-24 NOTE — Assessment & Plan Note (Signed)
Patient also reports psychiatrist diagnosed him with bipolar disorder for which he was recently started on tegretol and lamictal. He has been off these since prior to 10/26 and denies current symptoms of depression or mania.  - Continue to follow closely with psychiatrist while off medications. - Return precautions reviewed.

## 2014-04-24 NOTE — Assessment & Plan Note (Signed)
Likely due to medication side effect. Stopped tegretol and lamictal. - Rechecking today.

## 2014-05-05 ENCOUNTER — Other Ambulatory Visit: Payer: Self-pay | Admitting: *Deleted

## 2014-05-05 MED ORDER — CARVEDILOL 6.25 MG PO TABS: 6.2500 mg | ORAL_TABLET | Freq: Two times a day (BID) | ORAL | Status: DC

## 2014-05-07 ENCOUNTER — Ambulatory Visit (INDEPENDENT_AMBULATORY_CARE_PROVIDER_SITE_OTHER): Payer: No Typology Code available for payment source | Admitting: Podiatry

## 2014-05-07 VITALS — BP 128/74 | HR 82 | Resp 16

## 2014-05-07 DIAGNOSIS — M7662 Achilles tendinitis, left leg: Secondary | ICD-10-CM

## 2014-05-07 DIAGNOSIS — M7661 Achilles tendinitis, right leg: Secondary | ICD-10-CM

## 2014-05-07 MED ORDER — OXYCODONE-ACETAMINOPHEN 10-325 MG PO TABS: 1.0000 | ORAL_TABLET | Freq: Four times a day (QID) | ORAL | Status: DC | PRN

## 2014-05-07 NOTE — Progress Notes (Signed)
He presents today for follow-up of his Achilles tendinitis bilateral. He states they really been bothering him. He goes in for his gastric bypass in 2 weeks.  Objective: Vital signs are stable he is alert and oriented 3. Pulses are palpable bilateral. Pain on palpation to the Achilles tendon and its insertion site bilateral heels.  Assessment: Achilles tendinitis bilateral.  Plan: Injected subcutaneously 2 mg of dexamethasone superficial to the Achilles tendon posterior calcaneus. I also wrote another prescription for Percocet. [name] in the next month to 6 weeks.

## 2014-05-11 ENCOUNTER — Encounter: Payer: No Typology Code available for payment source | Attending: General Surgery

## 2014-05-11 VITALS — Ht 69.0 in | Wt >= 6400 oz

## 2014-05-11 DIAGNOSIS — Z713 Dietary counseling and surveillance: Secondary | ICD-10-CM | POA: Diagnosis not present

## 2014-05-11 DIAGNOSIS — Z6841 Body Mass Index (BMI) 40.0 and over, adult: Secondary | ICD-10-CM | POA: Diagnosis not present

## 2014-05-12 NOTE — Progress Notes (Signed)
  Pre-Operative Nutrition Class:  Appt start time: 3837   End time:  1830.  Patient was seen on 05/11/14 for Pre-Operative Bariatric Surgery Education [facility].   Surgery date: 05/26/2014 Surgery type: RYGB Start weight at [facility]: 412 on 02/28/14 Weight today: 413.5 lbs  TANITA  BODY COMP RESULTS  05/11/14   BMI (kg/m^2) 61.1   Fat Mass (lbs) 243   Fat Free Mass (lbs) 170.5   Total Body Water (lbs) 125   Samples given per MNT protocol. Patient educated on appropriate usage: PB2 (qty 1) Lot #: [id number] Exp: 01/2015  Unjury protein powder (strawberry - qty 1) Lot #: [id number] Exp: 07/2015  Celebrate Vitamins Multivitamin (pineapple strawberry - qty 1) Lot #: [id number] Exp: 09/2014  Bariatric Advantage MVI (orange - qty 1) Lot #: [id number] Exp: 10/2015  Bariatric Advantage vitamin B12 (peppermint - qty 1) Lot #: [id number] Exp: 05/2014  The following the learning objectives were met by the patient during this course:  Identify Pre-Op Dietary Goals and will begin 2 weeks pre-operatively  Identify appropriate sources of fluids and proteins   State protein recommendations and appropriate sources pre and post-operatively  Identify Post-Operative Dietary Goals and will follow for 2 weeks post-operatively  Identify appropriate multivitamin and calcium sources  Describe the need for physical activity post-operatively and will follow MD recommendations  State when to call healthcare provider regarding medication questions or post-operative complications  Handouts given during class include:  Pre-Op Bariatric Surgery Diet Handout  Protein Shake Handout  Post-Op Bariatric Surgery Nutrition Handout  BELT Program Information Flyer  Support Group Information Flyer  [facility] Bariatric Supplements Price List  Follow-Up Plan: Patient will follow-up at [facility] 2 weeks post operatively for diet advancement per MD.

## 2014-05-13 ENCOUNTER — Telehealth: Payer: Self-pay | Admitting: Podiatry

## 2014-05-13 NOTE — Telephone Encounter (Signed)
That is all right to substitute it. Thanks.

## 2014-05-13 NOTE — Telephone Encounter (Addendum)
Justin [name] - [facility] states Amantadine is not available, would like approval to substitute Imipramine.  Dr. Ardelle AntonWagoner states formulate without the Amantadine, may substitute the Imipramine.  Pharmacist states the topical formulation doesn't affect the pt's allergies.

## 2014-05-18 ENCOUNTER — Encounter: Payer: Self-pay | Admitting: Family Medicine

## 2014-05-20 NOTE — Patient Instructions (Addendum)
Justin [name]  05/20/2014   Your procedure is scheduled on:   [facility] and follow signs to               Short Stay Center at 515 AM.  Call this number if you have problems the morning of surgery [phone]   Remember: BRING CPAP MASK AND TUBING, FOLLOW ALL BOWEL PREP INSTRUCTIONS FROM DR Justin [name].  Do not eat food After Midnight Sunday night, clear liquids all day Monday 05-25-14, no clear liquids after midnight Monday night.     Take these medicines the morning of surgery with A SIP OF WATER: Allopurinol, Carvedilol                               You may not have any metal on your body including hair pins and              piercings  Do not wear jewelry, make-up, lotions, powders or perfumes.             Do not wear nail polish.  Do not shave  48 hours prior to surgery.              Men may shave face and neck.   Do not bring valuables to the hospital. [facility] IS NOT             RESPONSIBLE   FOR VALUABLES.  Contacts, dentures or bridgework may not be worn into surgery.  Leave suitcase in the car. After surgery it may be brought to your room.     Patients discharged the day of surgery will not be allowed to drive home.  Name and phone number of your driver:  Special Instructions: N/A              Please read over the following fact sheets you were given: _____________________________________________________________________             [facility] Pre[facility] Before surgery, you can play an important role.  Because skin is not sterile, your skin needs to be as free of germs as possible.  You can reduce the number of germs on your skin by washing with CHG (chlorahexidine gluconate) soap before surgery.  CHG is an antiseptic cleaner which kills germs and bonds with the skin to continue killing germs even after washing. Please DO NOT use if you have an allergy to CHG or antibacterial soaps.  If your skin  becomes reddened/irritated stop using the CHG and inform your nurse when you arrive at Short Stay. Do not shave (including legs and underarms) for at least 48 hours prior to the first CHG shower.  You may shave your face/neck. Please follow these instructions carefully:  1.  Shower with CHG Soap the night before surgery and the  morning of Surgery.  2.  If you choose to wash your hair, wash your hair first as usual with your  normal  shampoo.  3.  After you shampoo, rinse your hair and body thoroughly to remove the  shampoo.                           4.  Use CHG as you would any other liquid soap.  You can apply chg directly  to the  skin and wash                       Gently with a scrungie or clean washcloth.  5.  Apply the CHG Soap to your body ONLY FROM THE NECK DOWN.   Do not use on face/ open                           Wound or open sores. Avoid contact with eyes, ears mouth and genitals (private parts).                       Wash face,  Genitals (private parts) with your normal soap.             6.  Wash thoroughly, paying special attention to the area where your surgery  will be performed.  7.  Thoroughly rinse your body with warm water from the neck down.  8.  DO NOT shower/wash with your normal soap after using and rinsing off  the CHG Soap.                9.  Pat yourself dry with a clean towel.            10.  Wear clean pajamas.            11.  Place clean sheets on your bed the night of your first shower and do not  sleep with pets. Day of Surgery : Do not apply any lotions/deodorants the morning of surgery.  Please wear clean clothes to the hospital/surgery center.  FAILURE TO FOLLOW THESE INSTRUCTIONS MAY RESULT IN THE CANCELLATION OF YOUR SURGERY PATIENT SIGNATURE_________________________________  NURSE SIGNATURE__________________________________  ________________________________________________________________________

## 2014-05-20 NOTE — Progress Notes (Signed)
Chest xray 2 view 04-13-14 epic, echo 09-16-13 epic ekg 04-13-14 epic  cardiac cath 09-17-13 epic

## 2014-05-21 ENCOUNTER — Encounter (HOSPITAL_COMMUNITY)
Admission: RE | Admit: 2014-05-21 | Discharge: 2014-05-21 | Disposition: A | Discharge status: Home / Self Care | Payer: No Typology Code available for payment source | Source: Ambulatory Visit | Attending: General Surgery | Admitting: General Surgery

## 2014-05-21 ENCOUNTER — Encounter (HOSPITAL_COMMUNITY): Payer: Self-pay

## 2014-05-21 DIAGNOSIS — Z01818 Encounter for other preprocedural examination: Secondary | ICD-10-CM | POA: Insufficient documentation

## 2014-05-21 LAB — CBC WITH DIFFERENTIAL/PLATELET
BASOS ABS: 0.1 10*3/uL (ref 0.0–0.1)
Basophils Relative: 1 % (ref 0–1)
Eosinophils Absolute: 0.2 10*3/uL (ref 0.0–0.7)
Eosinophils Relative: 2 % (ref 0–5)
HEMATOCRIT: 44.1 % (ref 39.0–52.0)
HEMOGLOBIN: 15 g/dL (ref 13.0–17.0)
LYMPHS ABS: 4.1 10*3/uL — AB (ref 0.7–4.0)
LYMPHS PCT: 36 % (ref 12–46)
MCH: 30.3 pg (ref 26.0–34.0)
MCHC: 34 g/dL (ref 30.0–36.0)
MCV: 89.1 fL (ref 78.0–100.0)
MONO ABS: 0.7 10*3/uL (ref 0.1–1.0)
Monocytes Relative: 6 % (ref 3–12)
NEUTROS ABS: 6.2 10*3/uL (ref 1.7–7.7)
Neutrophils Relative %: 55 % (ref 43–77)
Platelets: 329 10*3/uL (ref 150–400)
RBC: 4.95 MIL/uL (ref 4.22–5.81)
RDW: 14.5 % (ref 11.5–15.5)
WBC: 11.3 10*3/uL — AB (ref 4.0–10.5)

## 2014-05-21 LAB — COMPREHENSIVE METABOLIC PANEL
ALT: 43 U/L (ref 0–53)
AST: 29 U/L (ref 0–37)
Albumin: 4.5 g/dL (ref 3.5–5.2)
Alkaline Phosphatase: 84 U/L (ref 39–117)
Anion gap: 16 — ABNORMAL HIGH (ref 5–15)
BUN: 20 mg/dL (ref 6–23)
CHLORIDE: 97 meq/L (ref 96–112)
CO2: 25 meq/L (ref 19–32)
Calcium: 10.3 mg/dL (ref 8.4–10.5)
Creatinine, Ser: 0.94 mg/dL (ref 0.50–1.35)
GFR calc Af Amer: >90 mL/min (ref >90)
Glucose, Bld: 101 mg/dL — ABNORMAL HIGH (ref 70–99)
Potassium: 4.3 mEq/L (ref 3.7–5.3)
Sodium: 138 mEq/L (ref 137–147)
Total Bilirubin: 0.6 mg/dL (ref 0.3–1.2)
Total Protein: 7.8 g/dL (ref 6.0–8.3)

## 2014-05-21 NOTE — Progress Notes (Signed)
baromax bed with trapeze ordered from [facility] in portable equipment

## 2014-05-25 NOTE — Anesthesia Preprocedure Evaluation (Signed)
Anesthesia Evaluation  Patient identified by MRN, date of birth, ID band Patient awake    Reviewed: Allergy & Precautions, H&P , NPO status , Patient's Chart, lab work & pertinent test results, reviewed documented beta blocker date and time   Airway Mallampati: III  TM Distance: >3 FB Neck ROM: Full    Dental no notable dental hx.    Pulmonary sleep apnea ,  breath sounds clear to auscultation  Pulmonary exam normal       Cardiovascular hypertension, Pt. on medications and Pt. on home beta blockers Rhythm:Regular Rate:Normal     Neuro/Psych PSYCHIATRIC DISORDERS Anxiety Bipolar Disorder negative neurological ROS     GI/Hepatic negative GI ROS, Neg liver ROS,   Endo/Other  Morbid obesity  Renal/GU negative Renal ROS     Musculoskeletal negative musculoskeletal ROS (+)   Abdominal (+) + obese,   Peds  Hematology negative hematology ROS (+)   Anesthesia Other Findings   Reproductive/Obstetrics                             Anesthesia Physical Anesthesia Plan  ASA: III  Anesthesia Plan: General   Post-op Pain Management:    Induction: Intravenous  Airway Management Planned: Oral ETT  Additional Equipment: None  Intra-op Plan:   Post-operative Plan: Extubation in OR  Informed Consent: I have reviewed the patients History and Physical, chart, labs and discussed the procedure including the risks, benefits and alternatives for the proposed anesthesia with the patient or authorized representative who has indicated his/her understanding and acceptance.   Dental advisory given  Plan Discussed with: CRNA  Anesthesia Plan Comments:         Anesthesia Quick Evaluation

## 2014-05-26 ENCOUNTER — Inpatient Hospital Stay (HOSPITAL_COMMUNITY): Payer: No Typology Code available for payment source | Admitting: Anesthesiology

## 2014-05-26 ENCOUNTER — Encounter (HOSPITAL_COMMUNITY): Payer: Self-pay | Admitting: *Deleted

## 2014-05-26 ENCOUNTER — Inpatient Hospital Stay (HOSPITAL_COMMUNITY)
Admission: RE | Admit: 2014-05-26 | Discharge: 2014-05-28 | DRG: 621 | Disposition: A | Discharge status: Home / Self Care | Payer: No Typology Code available for payment source | Source: Ambulatory Visit | Attending: General Surgery | Admitting: General Surgery

## 2014-05-26 ENCOUNTER — Encounter (HOSPITAL_COMMUNITY)
Admission: RE | Disposition: A | Discharge status: Home / Self Care | Payer: Self-pay | Source: Ambulatory Visit | Attending: General Surgery

## 2014-05-26 DIAGNOSIS — R110 Nausea: Secondary | ICD-10-CM

## 2014-05-26 DIAGNOSIS — F329 Major depressive disorder, single episode, unspecified: Secondary | ICD-10-CM | POA: Diagnosis present

## 2014-05-26 DIAGNOSIS — N200 Calculus of kidney: Secondary | ICD-10-CM | POA: Diagnosis present

## 2014-05-26 DIAGNOSIS — E781 Pure hyperglyceridemia: Secondary | ICD-10-CM | POA: Diagnosis present

## 2014-05-26 DIAGNOSIS — Z6841 Body Mass Index (BMI) 40.0 and over, adult: Secondary | ICD-10-CM | POA: Diagnosis not present

## 2014-05-26 DIAGNOSIS — G4730 Sleep apnea, unspecified: Secondary | ICD-10-CM | POA: Diagnosis present

## 2014-05-26 DIAGNOSIS — Z882 Allergy status to sulfonamides status: Secondary | ICD-10-CM

## 2014-05-26 DIAGNOSIS — I10 Essential (primary) hypertension: Secondary | ICD-10-CM | POA: Diagnosis present

## 2014-05-26 DIAGNOSIS — M779 Enthesopathy, unspecified: Secondary | ICD-10-CM | POA: Diagnosis present

## 2014-05-26 DIAGNOSIS — M109 Gout, unspecified: Secondary | ICD-10-CM | POA: Diagnosis present

## 2014-05-26 DIAGNOSIS — R7309 Other abnormal glucose: Secondary | ICD-10-CM | POA: Diagnosis present

## 2014-05-26 DIAGNOSIS — R7303 Prediabetes: Secondary | ICD-10-CM | POA: Diagnosis present

## 2014-05-26 HISTORY — PX: GASTRIC ROUX-EN-Y: SHX5262

## 2014-05-26 HISTORY — PX: UPPER GI ENDOSCOPY: SHX6162

## 2014-05-26 LAB — GLUCOSE, CAPILLARY
Glucose-Capillary: 138 mg/dL — ABNORMAL HIGH (ref 70–99)
Glucose-Capillary: 128 mg/dL — ABNORMAL HIGH (ref 70–99)
Glucose-Capillary: 114 mg/dL — ABNORMAL HIGH (ref 70–99)

## 2014-05-26 LAB — HEMOGLOBIN AND HEMATOCRIT, BLOOD
HEMATOCRIT: 40.6 % (ref 39.0–52.0)
Hemoglobin: 13.6 g/dL (ref 13.0–17.0)

## 2014-05-26 SURGERY — LAPAROSCOPIC ROUX-EN-Y GASTRIC BYPASS WITH UPPER ENDOSCOPY
Anesthesia: General

## 2014-05-26 MED ORDER — EVICEL 5 ML EX KIT
PACK | CUTANEOUS | Status: DC | PRN
  Administered 2014-05-26: 2

## 2014-05-26 MED ORDER — ENOXAPARIN SODIUM 30 MG/0.3ML ~~LOC~~ SOLN
30.0000 mg | Freq: Two times a day (BID) | SUBCUTANEOUS | Status: DC
  Administered 2014-05-27 – 2014-05-28 (×3): 30 mg via SUBCUTANEOUS
  Filled 2014-05-26 (×5): qty 0.3

## 2014-05-26 MED ORDER — CHLORHEXIDINE GLUCONATE 4 % EX LIQD: 60.0000 mL | Freq: Once | CUTANEOUS | Status: DC

## 2014-05-26 MED ORDER — SODIUM CHLORIDE 0.9 % IJ SOLN
INTRAMUSCULAR | Status: AC
  Filled 2014-05-26: qty 20

## 2014-05-26 MED ORDER — DEXTROSE 5 % IV SOLN
INTRAVENOUS | Status: AC
  Filled 2014-05-26: qty 2

## 2014-05-26 MED ORDER — CEFOXITIN SODIUM 2 G IV SOLR
2.0000 g | INTRAVENOUS | Status: AC
  Administered 2014-05-26: 2 g via INTRAVENOUS

## 2014-05-26 MED ORDER — NEOSTIGMINE METHYLSULFATE 10 MG/10ML IV SOLN
INTRAVENOUS | Status: DC | PRN
Start: 2014-05-26 — End: 2014-05-26
  Administered 2014-05-26: 5 mg via INTRAVENOUS

## 2014-05-26 MED ORDER — FENTANYL CITRATE 0.05 MG/ML IJ SOLN
INTRAMUSCULAR | Status: AC
  Filled 2014-05-26: qty 2

## 2014-05-26 MED ORDER — ONDANSETRON HCL 4 MG/2ML IJ SOLN
INTRAMUSCULAR | Status: AC
  Filled 2014-05-26: qty 2

## 2014-05-26 MED ORDER — SUCCINYLCHOLINE CHLORIDE 20 MG/ML IJ SOLN
INTRAMUSCULAR | Status: DC | PRN
  Administered 2014-05-26: 100 mg via INTRAVENOUS

## 2014-05-26 MED ORDER — OXYCODONE HCL 5 MG PO TABS: 5.0000 mg | ORAL_TABLET | Freq: Once | ORAL | Status: DC | PRN

## 2014-05-26 MED ORDER — METOPROLOL TARTRATE 1 MG/ML IV SOLN
INTRAVENOUS | Status: AC
Start: 2014-05-26 — End: 2014-05-27
  Filled 2014-05-26: qty 5

## 2014-05-26 MED ORDER — PROMETHAZINE HCL 25 MG/ML IJ SOLN
6.2500 mg | INTRAMUSCULAR | Status: DC | PRN
  Administered 2014-05-26: 6.25 mg via INTRAVENOUS

## 2014-05-26 MED ORDER — GLYCOPYRROLATE 0.2 MG/ML IJ SOLN
INTRAMUSCULAR | Status: DC | PRN
  Administered 2014-05-26: .8 mg via INTRAVENOUS

## 2014-05-26 MED ORDER — ONDANSETRON HCL 4 MG/2ML IJ SOLN: 4.0000 mg | INTRAMUSCULAR | Status: DC | PRN

## 2014-05-26 MED ORDER — HYDROMORPHONE HCL 1 MG/ML IJ SOLN
0.2500 mg | INTRAMUSCULAR | Status: DC | PRN
  Administered 2014-05-26 (×4): 0.25 mg via INTRAVENOUS

## 2014-05-26 MED ORDER — POTASSIUM CHLORIDE IN NACL 20-0.45 MEQ/L-% IV SOLN
INTRAVENOUS | Status: DC
  Administered 2014-05-26 (×2): via INTRAVENOUS
  Administered 2014-05-27: 1000 mL via INTRAVENOUS
  Administered 2014-05-27: 125 mL via INTRAVENOUS
  Administered 2014-05-27: 13:00:00 via INTRAVENOUS
  Administered 2014-05-28: 125 mL via INTRAVENOUS
  Filled 2014-05-26 (×7): qty 1000

## 2014-05-26 MED ORDER — BUPIVACAINE LIPOSOME 1.3 % IJ SUSP
20.0000 mL | Freq: Once | INTRAMUSCULAR | Status: AC
  Administered 2014-05-26: 20 mL
  Filled 2014-05-26: qty 20

## 2014-05-26 MED ORDER — MIDAZOLAM HCL 2 MG/2ML IJ SOLN
INTRAMUSCULAR | Status: AC
  Filled 2014-05-26: qty 2

## 2014-05-26 MED ORDER — INSULIN ASPART 100 UNIT/ML ~~LOC~~ SOLN
0.0000 [IU] | SUBCUTANEOUS | Status: DC
  Administered 2014-05-26 (×2): 3 [IU] via SUBCUTANEOUS

## 2014-05-26 MED ORDER — CISATRACURIUM BESYLATE 20 MG/10ML IV SOLN
INTRAVENOUS | Status: AC
  Filled 2014-05-26: qty 10

## 2014-05-26 MED ORDER — OXYCODONE HCL 5 MG/5ML PO SOLN
5.0000 mg | ORAL | Status: DC | PRN
  Administered 2014-05-27 (×3): 10 mg via ORAL
  Filled 2014-05-26 (×3): qty 10

## 2014-05-26 MED ORDER — NEOSTIGMINE METHYLSULFATE 10 MG/10ML IV SOLN
INTRAVENOUS | Status: AC
  Filled 2014-05-26: qty 1

## 2014-05-26 MED ORDER — MEPERIDINE HCL 50 MG/ML IJ SOLN: 6.2500 mg | INTRAMUSCULAR | Status: DC | PRN

## 2014-05-26 MED ORDER — TISSEEL VH 10 ML EX KIT
PACK | CUTANEOUS | Status: AC
  Filled 2014-05-26: qty 2

## 2014-05-26 MED ORDER — CISATRACURIUM BESYLATE (PF) 10 MG/5ML IV SOLN
INTRAVENOUS | Status: DC | PRN
  Administered 2014-05-26: 4 mg via INTRAVENOUS
  Administered 2014-05-26: 3 mg via INTRAVENOUS
  Administered 2014-05-26: 10 mg via INTRAVENOUS
  Administered 2014-05-26: 3 mg via INTRAVENOUS

## 2014-05-26 MED ORDER — PROPOFOL 10 MG/ML IV BOLUS
INTRAVENOUS | Status: AC
  Filled 2014-05-26: qty 20

## 2014-05-26 MED ORDER — LACTATED RINGERS IR SOLN
Status: DC | PRN
  Administered 2014-05-26: 1000 mL

## 2014-05-26 MED ORDER — GLYCOPYRROLATE 0.2 MG/ML IJ SOLN
INTRAMUSCULAR | Status: AC
  Filled 2014-05-26: qty 4

## 2014-05-26 MED ORDER — LACTATED RINGERS IV SOLN
INTRAVENOUS | Status: DC | PRN
  Administered 2014-05-26 (×3): via INTRAVENOUS

## 2014-05-26 MED ORDER — MORPHINE SULFATE 2 MG/ML IJ SOLN
2.0000 mg | INTRAMUSCULAR | Status: DC | PRN
  Administered 2014-05-26 – 2014-05-28 (×10): 4 mg via INTRAVENOUS
  Filled 2014-05-26 (×9): qty 2

## 2014-05-26 MED ORDER — DEXTROSE 5 % IV SOLN
2.0000 g | Freq: Once | INTRAVENOUS | Status: AC
  Administered 2014-05-26: 2 g via INTRAVENOUS
  Filled 2014-05-26: qty 2

## 2014-05-26 MED ORDER — SODIUM CHLORIDE 0.9 % IR SOLN
Status: DC | PRN
  Administered 2014-05-26: 1000 mL

## 2014-05-26 MED ORDER — PROPOFOL 10 MG/ML IV BOLUS
INTRAVENOUS | Status: DC | PRN
  Administered 2014-05-26: 200 mg via INTRAVENOUS

## 2014-05-26 MED ORDER — UNJURY VANILLA POWDER: 2.0000 [oz_av] | Freq: Four times a day (QID) | ORAL | Status: DC

## 2014-05-26 MED ORDER — MIDAZOLAM HCL 5 MG/5ML IJ SOLN
INTRAMUSCULAR | Status: DC | PRN
  Administered 2014-05-26: 2 mg via INTRAVENOUS

## 2014-05-26 MED ORDER — ONDANSETRON HCL 4 MG/2ML IJ SOLN
INTRAMUSCULAR | Status: DC | PRN
  Administered 2014-05-26: 4 mg via INTRAVENOUS

## 2014-05-26 MED ORDER — ACETAMINOPHEN 10 MG/ML IV SOLN
1000.0000 mg | Freq: Four times a day (QID) | INTRAVENOUS | Status: AC
  Administered 2014-05-26 – 2014-05-27 (×3): 1000 mg via INTRAVENOUS
  Filled 2014-05-26 (×5): qty 100

## 2014-05-26 MED ORDER — DEXAMETHASONE SODIUM PHOSPHATE 10 MG/ML IJ SOLN
INTRAMUSCULAR | Status: AC
  Filled 2014-05-26: qty 1

## 2014-05-26 MED ORDER — OXYCODONE HCL 5 MG/5ML PO SOLN: 5.0000 mg | Freq: Once | ORAL | Status: DC | PRN

## 2014-05-26 MED ORDER — METOCLOPRAMIDE HCL 5 MG/ML IJ SOLN
INTRAMUSCULAR | Status: AC
  Filled 2014-05-26: qty 2

## 2014-05-26 MED ORDER — SODIUM CHLORIDE 0.9 % IJ SOLN
INTRAMUSCULAR | Status: DC | PRN
  Administered 2014-05-26: 40 mL via INTRAVENOUS

## 2014-05-26 MED ORDER — PROMETHAZINE HCL 25 MG/ML IJ SOLN
INTRAMUSCULAR | Status: AC
Start: 2014-05-26 — End: 2014-05-27
  Filled 2014-05-26: qty 1

## 2014-05-26 MED ORDER — METOCLOPRAMIDE HCL 5 MG/ML IJ SOLN
INTRAMUSCULAR | Status: DC | PRN
  Administered 2014-05-26: 10 mg via INTRAVENOUS

## 2014-05-26 MED ORDER — HYDROMORPHONE HCL 1 MG/ML IJ SOLN
INTRAMUSCULAR | Status: AC
  Filled 2014-05-26: qty 1

## 2014-05-26 MED ORDER — PANTOPRAZOLE SODIUM 40 MG IV SOLR
40.0000 mg | Freq: Every day | INTRAVENOUS | Status: DC
  Administered 2014-05-26 – 2014-05-27 (×2): 40 mg via INTRAVENOUS
  Filled 2014-05-26 (×3): qty 40

## 2014-05-26 MED ORDER — UNJURY CHOCOLATE CLASSIC POWDER
2.0000 [oz_av] | Freq: Four times a day (QID) | ORAL | Status: DC
  Administered 2014-05-28: 2 [oz_av] via ORAL

## 2014-05-26 MED ORDER — DEXAMETHASONE SODIUM PHOSPHATE 10 MG/ML IJ SOLN
INTRAMUSCULAR | Status: DC | PRN
  Administered 2014-05-26: 10 mg via INTRAVENOUS

## 2014-05-26 MED ORDER — LACTATED RINGERS IV SOLN
INTRAVENOUS | Status: DC
  Administered 2014-05-26: 12:00:00 via INTRAVENOUS

## 2014-05-26 MED ORDER — ACETAMINOPHEN 160 MG/5ML PO SOLN: 650.0000 mg | ORAL | Status: DC | PRN

## 2014-05-26 MED ORDER — UNJURY CHICKEN SOUP POWDER: 2.0000 [oz_av] | Freq: Four times a day (QID) | ORAL | Status: DC

## 2014-05-26 MED ORDER — HEPARIN SODIUM (PORCINE) 5000 UNIT/ML IJ SOLN
5000.0000 [IU] | INTRAMUSCULAR | Status: AC
  Administered 2014-05-26: 5000 [IU] via SUBCUTANEOUS
  Filled 2014-05-26: qty 1

## 2014-05-26 MED ORDER — BUPIVACAINE-EPINEPHRINE 0.25% -1:200000 IJ SOLN
INTRAMUSCULAR | Status: AC
  Filled 2014-05-26: qty 1

## 2014-05-26 MED ORDER — METOPROLOL TARTRATE 1 MG/ML IV SOLN
5.0000 mg | Freq: Four times a day (QID) | INTRAVENOUS | Status: DC
  Administered 2014-05-26 – 2014-05-28 (×8): 5 mg via INTRAVENOUS
  Filled 2014-05-26 (×11): qty 5

## 2014-05-26 MED ORDER — FENTANYL CITRATE 0.05 MG/ML IJ SOLN
INTRAMUSCULAR | Status: DC | PRN
Start: 2014-05-26 — End: 2014-05-26
  Administered 2014-05-26: 50 ug via INTRAVENOUS
  Administered 2014-05-26: 100 ug via INTRAVENOUS
  Administered 2014-05-26 (×4): 50 ug via INTRAVENOUS

## 2014-05-26 MED ORDER — FENTANYL CITRATE 0.05 MG/ML IJ SOLN
INTRAMUSCULAR | Status: AC
  Filled 2014-05-26: qty 5

## 2014-05-26 MED ORDER — BUPIVACAINE-EPINEPHRINE 0.25% -1:200000 IJ SOLN
INTRAMUSCULAR | Status: DC | PRN
  Administered 2014-05-26: 30 mL

## 2014-05-26 MED ORDER — ACETAMINOPHEN 160 MG/5ML PO SOLN: 325.0000 mg | ORAL | Status: DC | PRN

## 2014-05-26 SURGICAL SUPPLY — 62 items
APPLICATOR COTTON TIP 6IN STRL (MISCELLANEOUS) IMPLANT
BLADE SURG SZ11 CARB STEEL (BLADE) ×3 IMPLANT
CABLE HIGH FREQUENCY MONO STRZ (ELECTRODE) IMPLANT
CHLORAPREP W/TINT 26ML (MISCELLANEOUS) ×6 IMPLANT
CLIP SUT LAPRA TY ABSORB (SUTURE) ×9 IMPLANT
DERMABOND ADVANCED (GAUZE/BANDAGES/DRESSINGS) ×1
DERMABOND ADVANCED .7 DNX12 (GAUZE/BANDAGES/DRESSINGS) ×2 IMPLANT
DEVICE SUTURE ENDOST 10MM (ENDOMECHANICALS) ×3 IMPLANT
DRAIN PENROSE 18X1/4 LTX STRL (WOUND CARE) ×3 IMPLANT
DRAPE CAMERA CLOSED 9X96 (DRAPES) ×3 IMPLANT
ELECT REM PT RETURN 9FT ADLT (ELECTROSURGICAL) ×3
ELECTRODE REM PT RTRN 9FT ADLT (ELECTROSURGICAL) ×2 IMPLANT
GAUZE SPONGE 4X4 12PLY STRL (GAUZE/BANDAGES/DRESSINGS) IMPLANT
GAUZE SPONGE 4X4 16PLY XRAY LF (GAUZE/BANDAGES/DRESSINGS) ×3 IMPLANT
GLOVE BIOGEL M STRL SZ7.5 (GLOVE) IMPLANT
GOWN STRL REUS W/TWL XL LVL3 (GOWN DISPOSABLE) ×12 IMPLANT
HOLDER FOLEY CATH W/STRAP (MISCELLANEOUS) ×3 IMPLANT
HOVERMATT SINGLE USE (MISCELLANEOUS) ×3 IMPLANT
KIT BASIN OR (CUSTOM PROCEDURE TRAY) ×3 IMPLANT
KIT GASTRIC LAVAGE 34FR ADT (SET/KITS/TRAYS/PACK) ×3 IMPLANT
LIQUID BAND (GAUZE/BANDAGES/DRESSINGS) ×3 IMPLANT
NEEDLE SPNL 22GX3.5 QUINCKE BK (NEEDLE) ×3 IMPLANT
PACK CARDIOVASCULAR III (CUSTOM PROCEDURE TRAY) ×3 IMPLANT
PEN SKIN MARKING BROAD (MISCELLANEOUS) ×3 IMPLANT
RELOAD 45 VASCULAR/THIN (ENDOMECHANICALS) ×6 IMPLANT
RELOAD ENDO STITCH 2.0 (ENDOMECHANICALS) ×7
RELOAD STAPLE TA45 3.5 REG BLU (ENDOMECHANICALS) ×6 IMPLANT
RELOAD STAPLER BLUE 60MM (STAPLE) ×12 IMPLANT
RELOAD STAPLER GOLD 60MM (STAPLE) ×2 IMPLANT
RELOAD STAPLER WHITE 60MM (STAPLE) ×2 IMPLANT
SCISSORS LAP 5X35 DISP (ENDOMECHANICALS) ×3 IMPLANT
SEALANT SURGICAL APPL DUAL CAN (MISCELLANEOUS) ×6 IMPLANT
SET IRRIG TUBING LAPAROSCOPIC (IRRIGATION / IRRIGATOR) ×3 IMPLANT
SHEARS HARMONIC ACE PLUS 45CM (MISCELLANEOUS) ×3 IMPLANT
SLEEVE ADV FIXATION 12X100MM (TROCAR) ×6 IMPLANT
SLEEVE XCEL OPT CAN 5 100 (ENDOMECHANICALS) IMPLANT
SOLUTION ANTI FOG 6CC (MISCELLANEOUS) ×3 IMPLANT
STAPLE ECHEON FLEX 60 POW ENDO (STAPLE) ×3 IMPLANT
STAPLER RELOAD BLUE 60MM (STAPLE) ×18
STAPLER RELOAD GOLD 60MM (STAPLE) ×3
STAPLER RELOAD WHITE 60MM (STAPLE) ×3
STAPLER VISISTAT 35W (STAPLE) IMPLANT
SUT DVC SILK 2.0X39 (SUTURE) ×9 IMPLANT
SUT DVC VICRYL PGA 2.0X39 (SUTURE) ×3 IMPLANT
SUT MNCRL AB 4-0 PS2 18 (SUTURE) ×3 IMPLANT
SUT RELOAD ENDO STITCH 2 48X1 (ENDOMECHANICALS) ×10
SUT RELOAD ENDO STITCH 2.0 (ENDOMECHANICALS) ×4
SUT VIC AB 2-0 SH 27 (SUTURE) ×1
SUT VIC AB 2-0 SH 27X BRD (SUTURE) ×2 IMPLANT
SUTURE RELOAD END STTCH 2 48X1 (ENDOMECHANICALS) ×10 IMPLANT
SUTURE RELOAD ENDO STITCH 2.0 (ENDOMECHANICALS) ×4 IMPLANT
SYR 20CC LL (SYRINGE) ×3 IMPLANT
TOWEL OR 17X26 10 PK STRL BLUE (TOWEL DISPOSABLE) ×3 IMPLANT
TOWEL OR NON WOVEN STRL DISP B (DISPOSABLE) IMPLANT
TRAY FOLEY CATH 14FRSI W/METER (CATHETERS) IMPLANT
TRAY FOLEY CATH 16FRSI W/METER (SET/KITS/TRAYS/PACK) ×3 IMPLANT
TROCAR ADV FIXATION 12X100MM (TROCAR) ×3 IMPLANT
TROCAR ADV FIXATION 5X100MM (TROCAR) ×3 IMPLANT
TROCAR BLADELESS OPT 5 100 (ENDOMECHANICALS) ×3 IMPLANT
TROCAR XCEL 12X100 BLDLESS (ENDOMECHANICALS) ×3 IMPLANT
TUBING ENDO SMARTCAP PENTAX (MISCELLANEOUS) ×3 IMPLANT
TUBING FILTER THERMOFLATOR (ELECTROSURGICAL) ×3 IMPLANT

## 2014-05-26 NOTE — Progress Notes (Signed)
RT placed PT on  CPAP 12cm H20 (per PT home settings) with 2 lpm 02 bleed in. PT is tolerating CPAP at this time. RN aware.

## 2014-05-26 NOTE — H&P (Signed)
Justin [name]. Orton 05/07/2014 11:30 AM Location: [facility] Patient #: [id number] DOB: Aug 09, 1970 Married / Language: English / Race: White Male  History of Present Illness Justin [name] M. [name] MD; 05/22/2014 10:22 AM) Patient words: bariatric f/u.  The patient is a 43 year old male who presents for a pre-op visit. He is currently scheduled to undergo laparoscopic Roux-en-Y gastric bypass in the near future. I initially met him in the office on February 04, 2014. His weight at that time was 409 pounds with a BMI of 58.6. He denies any significant events since he was last seen. However he did end up in the emergency room in November 5 with fever, chills and some aches. He states that his symptoms have completely resolved. He is already started substituting one solid meal for protein shake a day. They believe his symptoms that caused him to go to the emergency room was a reaction to Tegretol and Lamictal. He denies any lightheadedness or dizziness. He denies any abdominal pain. He denies any chest pain. He denies any myalgias. He has already cut out soft drinks and caffeinated beverages. His upper GI was unremarkable. His preoperative chest x-ray was unremarkable. His comprehensive metabolic panel and CBC were normal in August. He did have slightly elevated AST and ALT on October 26 with a level of 73 and 83. His triglyceride level was 156. His hemoglobin A1c was 6   Other Problems Justin Curry, MD; 05/22/2014 10:18 AM) High blood pressure Kidney Stone Other disease, cancer, significant illness Sleep Apnea MORBID OBESITY WITH BMI OF 60.0-69.9, ADULT (278.01  E66.01) PREDIABETES (790.29  R73.09) ACHILLES TENDINITIS OF BOTH LOWER EXTREMITIES (726.71  M76.61, M76.62) LOW BACK PAIN, NON-SPECIFIC (724.2  M54.5)  Past Surgical History Justin [name], CMA; 05/07/2014 11:27 AM) Oral Surgery  Diagnostic Studies History Justin [name], CMA; 05/07/2014 11:27  AM) Colonoscopy never  Allergies Justin [name], CMA; 05/07/2014 11:31 AM) LaMICtal *ANTICONVULSANTS* TEGretol *ANTICONVULSANTS* Sulfa Antibiotics  Medication History Justin Curry, MD; 05/22/2014 10:18 AM) Allopurinol (300MG Tablet, Oral) Active. Carvedilol (6.25MG Tablet, Oral) Active. Vitamin D (400UNIT Capsule, Oral) Active. Etodolac (400MG Tablet, Oral) Active. Furosemide (20MG Tablet, Oral) Active. Lisinopril-Hydrochlorothiazide (20-25MG Tablet, Oral) Active. Nitrostat (0.4MG Tab Sublingual, Sublingual) Active. Multivitamins (Oral) Active. Zinc (50MG Tablet, Oral) Active. OxyCODONE HCl (5MG/5ML Solution, 5-10 Milliliter Oral every four hours, as needed, Taken starting 05/07/2014) Active.  Social History ([place]; 05/07/2014 11:27 AM) Caffeine use Carbonated beverages, Coffee, Tea. No alcohol use No drug use Tobacco use Never smoker.  Family History Justin [name], [place]; 05/07/2014 11:27 AM) Alcohol Abuse Father. Depression Father, Mother. Diabetes Mellitus Mother. Heart Disease Mother. Heart disease in male family member before age 92 Heart disease in male family member before age 25 Hypertension Father, Mother. Kidney Disease Father. Migraine Headache Mother. Respiratory Condition Mother. Seizure disorder Son.  Review of Systems Justin [name] CMA; 05/07/2014 11:27 AM) General Not Present- Appetite Loss, Chills, Fatigue, Fever, Night Sweats, Weight Gain and Weight Loss. Skin Not Present- Change in Wart/Mole, Dryness, Hives, Jaundice, New Lesions, Non-Healing Wounds, Rash and Ulcer. HEENT Not Present- Earache, Hearing Loss, Hoarseness, Nose Bleed, Oral Ulcers, Ringing in the Ears, Seasonal Allergies, Sinus Pain, Sore Throat, Visual Disturbances, Wears glasses/contact lenses and Yellow Eyes. Respiratory Not Present- Bloody sputum, Chronic Cough, Difficulty Breathing, Snoring and Wheezing. Breast Not Present- Breast Mass, Breast Pain,  Nipple Discharge and Skin Changes. Cardiovascular Not Present- Chest Pain, Difficulty Breathing Lying Down, Leg Cramps, Palpitations, Rapid Heart Rate, Shortness of Breath and Swelling of Extremities.  Gastrointestinal Not Present- Abdominal Pain, Bloating, Bloody Stool, Change in Bowel Habits, Chronic diarrhea, Constipation, Difficulty Swallowing, Excessive gas, Gets full quickly at meals, Hemorrhoids, Indigestion, Nausea, Rectal Pain and Vomiting. Male Genitourinary Not Present- Blood in Urine, Change in Urinary Stream, Frequency, Impotence, Nocturia, Painful Urination, Urgency and Urine Leakage. Musculoskeletal Not Present- Back Pain, Joint Pain, Joint Stiffness, Muscle Pain, Muscle Weakness and Swelling of Extremities. Neurological Not Present- Decreased Memory, Fainting, Headaches, Numbness, Seizures, Tingling, Tremor, Trouble walking and Weakness. Psychiatric Not Present- Anxiety, Bipolar, Change in Sleep Pattern, Depression, Fearful and Frequent crying. Endocrine Not Present- Cold Intolerance, Excessive Hunger, Hair Changes, Heat Intolerance, Hot flashes and New Diabetes.   Vitals (Sonya [name] CMA; 05/07/2014 11:29 AM) 05/07/2014 11:27 AM Weight: 408.2 lb Height: 69in Body Surface Area: 3 m Body Mass Index: 60.28 kg/m Temp.: 42F(Temporal)  Pulse: 81 (Regular)  BP: 138/90 (Sitting, Left Arm, Standard)    Physical Exam Justin [name] M. Wilson MD; 05/22/2014 10:18 AM) General Mental Status-Alert. General Appearance-Consistent with stated age. Hydration-Well hydrated. Voice-Normal. Note: MORBIDLY OBESE   Head and Neck Head-normocephalic, atraumatic with no lesions or palpable masses. Trachea-midline. Thyroid Gland Characteristics - normal size and consistency.  Eye Eyeball - Bilateral-Extraocular movements intact. Sclera/Conjunctiva - Bilateral-No scleral icterus.  Chest and Lung Exam Chest and lung exam reveals -quiet, even and easy respiratory  effort with no use of accessory muscles and on auscultation, normal breath sounds, no adventitious sounds and normal vocal resonance. Inspection Chest Wall - Normal. Back - normal.  Breast - Did not examine.  Cardiovascular Cardiovascular examination reveals -normal heart sounds, regular rate and rhythm with no murmurs and normal pedal pulses bilaterally.  Abdomen Inspection Inspection of the abdomen reveals - No Hernias. Skin - Scar - no surgical scars. Palpation/Percussion Palpation and Percussion of the abdomen reveal - Soft, Non Tender, No Rebound tenderness, No Rigidity (guarding) and No hepatosplenomegaly. Auscultation Auscultation of the abdomen reveals - Bowel sounds normal.  Peripheral Vascular Upper Extremity Palpation - Pulses bilaterally normal.  Neurologic Neurologic evaluation reveals -alert and oriented x 3 with no impairment of recent or remote memory. Mental Status-Normal.  Neuropsychiatric The patient's mood and affect are described as -normal. Judgment and Insight-insight is appropriate concerning matters relevant to self.  Musculoskeletal Normal Exam - Left-Upper Extremity Strength Normal and Lower Extremity Strength Normal. Normal Exam - Right-Upper Extremity Strength Normal and Lower Extremity Strength Normal.  Lymphatic Head & Neck  General Head & Neck Lymphatics: Bilateral - Description - Normal. Axillary - Did not examine. Femoral & Inguinal - Did not examine.    Assessment & Plan Justin [name] M. Wilson MD; 05/22/2014 10:25 AM) [place] Evener OBESITY WITH BMI OF 60.0-69.9, ADULT (278.01  E66.01) Impression: He has maintained his weight since I initially met him. We discussed the importance of the preoperative diet. Reviewed his workup today including his upper GI, chest x-ray and lab results. We discussed his hemoglobin A1c as being consistent with prediabetes. He was given a prescription for his postoperative pain medicine prescription today. He  was also given a MiraLAX bowel prep. He was also instructed to stop his Lodine. All of his questions were asked and answered. Current Plans  Instructions: Congratulations on starting your journey to a healthier life! Over the next few weeks you will be undergoing tests (x-rays and labs) and seeing specialists to help evaluate you for weight loss surgery. These tests and consultations with a psychologist and nutritionist are needed to prepare you for the lifestyle changes that lie ahead and are often  required by insurance companies to approve you for surgery. Please call me if you have any questions during the evaluation.  Pathway to Surgery:    Two weeks prior to surgery Go on the extremely low carb liquid diet - this will decrease the size of your liver which will make surgery safer - the nutritionist will go over this at a later date Attend preoperative appointment with your surgeon Attend preoperative surgery class  One week prior to surgery No aspirin products. Tylenol is acceptable  24 hours prior to surgery No alcoholic beverages Report fever greater than 100.5 or excessive nasal drainage suggesting infection Continue bariatric preop diet Perform bowel prep if ordered Do not eat or drink anything after midnight the night before surgery Do not take any medications except those instructed by the anesthesiologist  Morning of surgery Please arrive at the hospital at least 2 hours before your scheduled surgery time. No makeup, fingernail polish or jewelry Bring insurance cards with you Bring your CPAP mask if you use this Started OxyCODONE HCl 5MG/5ML, 5-10 Milliliter every four hours, as needed, 200 Milliliter, 05/07/2014, No Refill. Instructions: Miralax bowel prep Instructions: stop Lodine at least 1 week before surgery  Sleep Apnea Impression: Stable  High blood pressure Impression: Stable  PREDIABETES (790.29  R73.09) Impression: New diagnosis. Discussed hemoglobin  A1c reading. Discussed the importance of food choices, activity, and weight loss  LOW BACK PAIN, NON-SPECIFIC (724.2  M54.5) Impression: Stable  ACHILLES TENDINITIS OF BOTH LOWER EXTREMITIES (726.71  M76.61)  HIGH TRIGLYCERIDES (272.1  E78.1) Impression: Stable  Leighton Ruff. Redmond Pulling, MD, FACS General, Bariatric, & Minimally Invasive Surgery [facility], [place]

## 2014-05-26 NOTE — Op Note (Signed)
Justin [name] [id number] 1971-06-17. 05/26/2014  Preoperative diagnosis:  Morbid obesity (BMI 57)  Sleep Apnea High blood pressure PREDIABETES  LOW BACK PAIN, NON-SPECIFIC  ACHILLES TENDINITIS OF BOTH LOWER EXTREMITIES  HIGH TRIGLYCERIDES  Gout  Postoperative  diagnosis:  1. same  Surgical procedure: Laparoscopic Roux-en-Y gastric bypass (ante-colic, ante-gastric); upper endoscopy  Surgeon: Atilano InaEric M Zarian Colpitts, M.D. FACS FASMBS  Asst.: Glenna FellowsBenjamin Hoxworth, MD FACS  Anesthesia: General plus 60cc Exparel  Complications: None   EBL: Minimal   Drains: None   Disposition: PACU in good condition   Indications for procedure: 43yo WM with morbid obesity who has been unsuccessful at sustained weight loss. The patient's comorbidities are listed above. We discussed the risk and benefits of surgery including but not limited to anesthesia risk, bleeding, infection, blood clot formation, anastomotic leak, anastomotic stricture, ulcer formation, death, respiratory complications, intestinal blockage, internal hernia, gallstone formation, vitamin and nutritional deficiencies, injury to surrounding structures, failure to lose weight and mood changes.   Description of procedure: Patient is brought to the operating room and general anesthesia induced. The patient had received preoperative broad-spectrum IV antibiotics and subcutaneous heparin. The abdomen was widely sterilely prepped with Chloraprep and draped. Patient timeout was performed and correct patient and procedure confirmed. Access was obtained with a 12 mm Optiview trocar in the left upper quadrant and pneumoperitoneum established without difficulty. Under direct vision 12 mm trocars were placed laterally in the right upper quadrant, right upper quadrant midclavicular line, and to the left and above the umbilicus for the camera port. A 5 mm trocar was placed laterally in the left upper quadrant.  The omentum was brought into the upper abdomen and  the transverse mesocolon elevated and the ligament of Treitz clearly identified. A 40 cm biliopancreatic limb was then carefully measured from the ligament of Treitz. The small intestine was divided at this point with a single firing of the white load linear stapler. A Penrose drain was sutured to the end of the Roux-en-Y limb for later identification. A 100 cm Roux-en-Y limb was then carefully measured. At this point a side-to-side anastomosis was created between the Roux limb and the end of the biliopancreatic limb. This was accomplished with a single firing of the 45 mm white load linear stapler. The common enterotomy was closed with a running 2-0 Vicryl begun at either end of the enterotomy and tied centrally. Tisseel tissue sealant was placed over the anastomosis. The mesenteric defect was then closed with running 2-0 silk using the Endo360. The omentum was then divided with the harmonic scalpel up towards the transverse colon to allow mobility of the Roux limb toward the gastric pouch. The patient was then placed in steep reversed Trendelenburg. Through a 5 mm subxiphoid site the [facility] retractor was placed and the left lobe of the liver elevated with excellent exposure of the upper stomach and hiatus. The angle of Hiss was then mobilized with the harmonic scalpel. A 4-5 cm gastric pouch was then carefully measured along the lesser curve of the stomach. Dissection was carried along the lesser curve at this point with the Harmonic scalpel working carefully back toward the lesser sac at right angles to the lesser curve. The free lesser sac was then entered. After being sure all tubes were removed from the stomach an initial firing of the gold load 60 mm linear stapler was fired at right angles across the lesser curve for about 4 cm. The gastric pouch was further mobilized posteriorly and then the pouch was  completed with 5 further firings of the 60 mm blue load linear stapler and 1 final firing of a blue load  45mm linear stapler up through the previously dissected angle of His. It was ensured that the pouch was completely mobilized away from the gastric remnant. This created a nice tubular 4-5 cm gastric pouch. The Roux limb was then brought up in an antecolic fashion with the candycane facing to the patient's left without undue tension. The gastrojejunostomy was created with an initial posterior row of 2-0 Vicryl between the Roux limb and the staple line of the gastric pouch. Enterotomies were then made in the gastric pouch and the Roux limb with the harmonic scalpel and at approximately 2-2-1/2 cm anastomosis was created with a single firing of the 45mm blue load linear stapler. The staple line was inspected and was intact without bleeding. The common enterotomy was then closed with running 2-0 Vicryl begun at either end and tied centrally. The Ewall tube was then easily passed through the anastomosis and an outer anterior layer of running 2-0 Vicryl was placed. The Ewald tube was removed. With the outlet of the gastrojejunostomy clamped and under saline irrigation the assistant performed upper endoscopy and with the gastric pouch tensely distended with air-there was no evidence of leak on this test. The pouch was desufflated. The Vonita Mosseterson defect was closed with running 2-0 silk. The abdomen was inspected for any evidence of bleeding or bowel injury and everything looked fine. The Nathanson retractor was removed under direct vision after coating the anastomosis with Tisseel tissue sealant. 60cc of exparel was infiltrated in the preperitonal spaces around all the trocar and liver retractor sites. All CO2 was evacuated and trochars removed. Skin incisions were closed with 4-0 monocryl in a subcuticular fashion followed by liquid band exceed. Sponge needle and instrument counts were correct. The patient was taken to the PACU in good condition.    Mary SellaEric M. [name], MD, FACS General, Bariatric, & Minimally Invasive  [facility], [place]

## 2014-05-26 NOTE — Interval H&P Note (Signed)
History and Physical Interval Note:  05/26/2014 7:23 AM  Justin [name]  has presented today for surgery, with the diagnosis of Morbid Obesity  The various methods of treatment have been discussed with the patient and family. After consideration of risks, benefits and other options for treatment, the patient has consented to  Procedure(s): LAPAROSCOPIC ROUX-EN-Y GASTRIC BYPASS WITH UPPER ENDOSCOPY (N/A) as a surgical intervention .  The patient's history has been reviewed, patient examined, no change in status, stable for surgery.  I have reviewed the patient's chart and labs.  Questions were answered to the patient's satisfaction.    Mary SellaEric M. [name], MD, FACS General, Bariatric, & Minimally Invasive Surgery [facility], [place]   [facility] HospitalWILSON,[place] M

## 2014-05-26 NOTE — Op Note (Signed)
Upper GI endoscopy is performed at the completion of laparoscopic Roux-en-Y gastric bypass by Dr. Wilson. The Olympus video endoscope was inserted into the upper esophagus and then passed under direct vision to the EG junction. The small gastric pouch was insufflated with air while the gastric outlet was clamped under irrigation by the operating surgeon. There was no evidence of leak. The anastomosis was visualized and was patent. Suture and staple lines were intact and without bleeding. The pouch was tubular and measured 5 cm in length. At the completion of the procedure the pouch was desufflated and the scope withdrawn. 

## 2014-05-26 NOTE — Progress Notes (Signed)
Patient alert and oriented, op day.  Provided support and encouragement.  Encouraged pulmonary toilet and ambulation.  All questions answered.  Will continue to monitor. 

## 2014-05-26 NOTE — Progress Notes (Signed)
Patient placed on CPAP with home setting 8 cmH2O. Water chamber filled with sterile water for humidificaion. Patient wearing end tidal CO2 nasal canula with his full face cpap mask. Patient is tolerating well. RT yo monitor and assess as needed.

## 2014-05-26 NOTE — Anesthesia Postprocedure Evaluation (Signed)
Anesthesia Post Note  Patient: Justin [name]  Procedure(s) Performed: Procedure(s) (LRB): LAPAROSCOPIC ROUX-EN-Y GASTRIC BYPASS WITH UPPER ENDOSCOPY (N/A) UPPER GI ENDOSCOPY  Anesthesia type: General  Patient location: PACU  Post pain: Pain level controlled  Post assessment: Post-op Vital signs reviewed  Last Vitals: BP 176/100 mmHg  Pulse 86  Temp(Src) 36.7 C (Oral)  Resp 14  Ht 5\' 9"  (1.753 m)  Wt 390 lb 4 oz (177.016 kg)  BMI 57.60 kg/m2  SpO2 92%  Post vital signs: Reviewed  Level of consciousness: sedated  Complications: No apparent anesthesia complications

## 2014-05-26 NOTE — Transfer of Care (Signed)
Immediate Anesthesia Transfer of Care Note  Patient: Justin [name]  Procedure(s) Performed: Procedure(s): LAPAROSCOPIC ROUX-EN-Y GASTRIC BYPASS WITH UPPER ENDOSCOPY (N/A) UPPER GI ENDOSCOPY  Patient Location: PACU  Anesthesia Type:General  Level of Consciousness: awake, sedated and patient cooperative  Airway & Oxygen Therapy: Patient Spontanous Breathing and Patient connected to face mask oxygen  Post-op Assessment: Report given to PACU RN and Post -op Vital signs reviewed and stable  Post vital signs: Reviewed and stable  Complications: No apparent anesthesia complications

## 2014-05-27 ENCOUNTER — Encounter (HOSPITAL_COMMUNITY): Payer: Self-pay | Admitting: General Surgery

## 2014-05-27 ENCOUNTER — Inpatient Hospital Stay (HOSPITAL_COMMUNITY): Payer: No Typology Code available for payment source

## 2014-05-27 LAB — COMPREHENSIVE METABOLIC PANEL
ALK PHOS: 67 U/L (ref 39–117)
ALT: 52 U/L (ref 0–53)
AST: 23 U/L (ref 0–37)
Albumin: 3.6 g/dL (ref 3.5–5.2)
Anion gap: 15 (ref 5–15)
BUN: 11 mg/dL (ref 6–23)
CO2: 25 meq/L (ref 19–32)
Calcium: 9.1 mg/dL (ref 8.4–10.5)
Chloride: 101 mEq/L (ref 96–112)
Creatinine, Ser: 0.86 mg/dL (ref 0.50–1.35)
GFR calc non Af Amer: >90 mL/min (ref >90)
GLUCOSE: 110 mg/dL — AB (ref 70–99)
Potassium: 4.3 mEq/L (ref 3.7–5.3)
SODIUM: 141 meq/L (ref 137–147)
TOTAL PROTEIN: 6.6 g/dL (ref 6.0–8.3)
Total Bilirubin: 0.6 mg/dL (ref 0.3–1.2)

## 2014-05-27 LAB — GLUCOSE, CAPILLARY
GLUCOSE-CAPILLARY: 102 mg/dL — AB (ref 70–99)
GLUCOSE-CAPILLARY: 100 mg/dL — AB (ref 70–99)
GLUCOSE-CAPILLARY: 97 mg/dL (ref 70–99)
GLUCOSE-CAPILLARY: 91 mg/dL (ref 70–99)
Glucose-Capillary: 80 mg/dL (ref 70–99)
Glucose-Capillary: 91 mg/dL (ref 70–99)

## 2014-05-27 LAB — CBC WITH DIFFERENTIAL/PLATELET
Basophils Absolute: 0 10*3/uL (ref 0.0–0.1)
Basophils Relative: 0 % (ref 0–1)
EOS ABS: 0 10*3/uL (ref 0.0–0.7)
Eosinophils Relative: 0 % (ref 0–5)
HCT: 39.4 % (ref 39.0–52.0)
Hemoglobin: 13.1 g/dL (ref 13.0–17.0)
LYMPHS ABS: 2.4 10*3/uL (ref 0.7–4.0)
LYMPHS PCT: 17 % (ref 12–46)
MCH: 29.9 pg (ref 26.0–34.0)
MCHC: 33.2 g/dL (ref 30.0–36.0)
MCV: 90 fL (ref 78.0–100.0)
Monocytes Absolute: 1.1 10*3/uL — ABNORMAL HIGH (ref 0.1–1.0)
Monocytes Relative: 8 % (ref 3–12)
Neutro Abs: 10.4 10*3/uL — ABNORMAL HIGH (ref 1.7–7.7)
Neutrophils Relative %: 75 % (ref 43–77)
PLATELETS: 303 10*3/uL (ref 150–400)
RBC: 4.38 MIL/uL (ref 4.22–5.81)
RDW: 14.5 % (ref 11.5–15.5)
WBC: 13.9 10*3/uL — AB (ref 4.0–10.5)

## 2014-05-27 LAB — HEMOGLOBIN AND HEMATOCRIT, BLOOD
HCT: 39.3 % (ref 39.0–52.0)
Hemoglobin: 13.3 g/dL (ref 13.0–17.0)

## 2014-05-27 MED ORDER — IOHEXOL 300 MG/ML  SOLN
50.0000 mL | Freq: Once | INTRAMUSCULAR | Status: AC | PRN
  Administered 2014-05-27: 40 mL via ORAL

## 2014-05-27 NOTE — Plan of Care (Signed)
Problem: Phase I Progression Outcomes Goal: Pain controlled with appropriate interventions Outcome: Completed/Met Date Met:  05/27/14 Goal: Initial discharge plan identified Outcome: Completed/Met Date Met:  05/27/14     

## 2014-05-27 NOTE — Care Management Note (Signed)
    Page 1 of 1   05/27/2014     10:24:35 AM CARE MANAGEMENT NOTE 05/27/2014  Patient:  Justin MohCOBLE,Justin [name]   Account Number:  [id number]  Date Initiated:  05/27/2014  Documentation initiated by:  Lorenda IshiharaPEELE,[place] List  Subjective/Objective Assessment:   43 yo male admitted s/p gastric bypass. PTA lived at home with spouse.     Action/Plan:   Home when stable   Anticipated DC Date:  05/30/2014   Anticipated DC Plan:  HOME/SELF CARE      DC Planning Services  CM consult      Choice offered to / List presented to:             Status of service:  Completed, signed off Medicare Important Message given?   (If response is "NO", the following Medicare IM given date fields will be blank) Date Medicare IM given:   Medicare IM given by:   Date Additional Medicare IM given:   Additional Medicare IM given by:    Discharge Disposition:  HOME/SELF CARE  Per UR Regulation:  Reviewed for med. necessity/level of care/duration of stay  If discussed at Long Length of Stay Meetings, dates discussed:    Comments:

## 2014-05-27 NOTE — Plan of Care (Signed)
Problem: Phase I Progression Outcomes Goal: Hemodynamically stable Outcome: Completed/Met Date Met:  05/27/14 Goal: Pulmonary function stable Outcome: Completed/Met Date Met:  05/27/14 Goal: CPAP/BI-PAP utilized with sleeping per order Outcome: Completed/Met Date Met:  05/27/14 Goal: Operative site clean or minimal drainage Outcome: Completed/Met Date Met:  05/27/14 Goal: Bariatric bed/trapeze per MD Outcome: Completed/Met Date Met:  05/27/14

## 2014-05-27 NOTE — Plan of Care (Signed)
Problem: Phase I Progression Outcomes Goal: Diet - NPO Outcome: Completed/Met Date Met:  05/27/14

## 2014-05-27 NOTE — Plan of Care (Signed)
Problem: Phase I Progression Outcomes Goal: Voiding-avoid urinary catheter unless indicated Outcome: Completed/Met Date Met:  05/27/14     

## 2014-05-27 NOTE — Plan of Care (Signed)
Problem: Phase I Progression Outcomes Goal: OOB as tolerated unless otherwise ordered Outcome: Completed/Met Date Met:  05/27/14

## 2014-05-27 NOTE — Progress Notes (Signed)
1 Day Post-Op  Subjective: Didn't sleep well. Some nausea; pain ok. Ambulated several times.   Objective: Vital signs in last 24 hours: Temp:  [97.4 F (36.3 C)-98.4 F (36.9 C)] 98 F (36.7 C) (12/09 0530) Pulse Rate:  [64-102] 76 (12/09 0530) Resp:  [13-24] 20 (12/09 0530) BP: (115-190)/(67-109) 134/80 mmHg (12/09 0530) SpO2:  [91 %-100 %] 93 % (12/09 0530) Weight:  [390 lb 4 oz (177.016 kg)] 390 lb 4 oz (177.016 kg) (12/08 1356) Last BM Date: 05/25/14  Intake/Output from previous day: 12/08 0701 - 12/09 0700 In: 5870.8 [I.V.:5570.8; IV Piggyback:300] Out: 1950 [Urine:1875; Blood:75] Intake/Output this shift:    Awake, alert, nad cta b/l IS 1500 Reg Obese, soft, incisions c/d/i; min TTP +SCDS  Lab Results:   Recent Labs  05/26/14 1428 05/27/14 0350  WBC  --  13.9*  HGB 13.6 13.1  HCT 40.6 39.4  PLT  --  303   BMET  Recent Labs  05/27/14 0350  NA 141  K 4.3  CL 101  CO2 25  GLUCOSE 110*  BUN 11  CREATININE 0.86  CALCIUM 9.1   Hepatic Function Latest Ref Rng 05/27/2014 05/21/2014 04/23/2014  Total Protein 6.0 - 8.3 g/dL 6.6 7.8 6.5  Albumin 3.5 - 5.2 g/dL 3.6 4.5 3.9  AST 0 - 37 U/L _0 ALT 0 - 53 U/L 52 43 55(H)  Alk Phosphatase 39 - 117 U/L 67 84 96  Total Bilirubin 0.3 - 1.2 mg/dL 0.6 0.6 0.5     PT/INR No results for input(s): LABPROT, INR in the last 72 hours. ABG No results for input(s): PHART, HCO3 in the last 72 hours.  Invalid input(s): PCO2, PO2  Studies/Results: No results found.  Anti-infectives: Anti-infectives    Start     Dose/Rate Route Frequency Ordered Stop   05/26/14 0915  cefOXitin (MEFOXIN) 2 g in dextrose 5 % 50 mL IVPB     2 g100 mL/hr over 30 Minutes Intravenous  Once 05/26/14 0914 05/26/14 0922   05/26/14 0516  cefOXitin (MEFOXIN) 2 g in dextrose 5 % 50 mL IVPB     2 g100 mL/hr over 30 Minutes Intravenous On call to O.R. 05/26/14 0516 05/26/14 0715      Assessment/Plan: s/p Procedure(s): LAPAROSCOPIC  ROUX-EN-Y GASTRIC BYPASS WITH UPPER ENDOSCOPY (N/A) UPPER GI ENDOSCOPY Active Problems:   HYPERTENSION, BENIGN   Sleep apnea   Morbid obesity with BMI of 50.0-59.9, adult   Prediabetes  Looks good. Vitals ok. No fever. For UGI today, if ok will start pod 1 diet Ambulate, pulm toilet Cont VTE prophylaxis - hgb stable Blood sugars ok Cont CPAP at night  Justin Ruff. Redmond Pulling, Justin [name], Justin [name] [facility] Bariat[facility], [facility] Minimally Invasive [facility], [place]   LOS: 1 day    Justin [name] 05/27/2014

## 2014-05-27 NOTE — Progress Notes (Signed)
RT set CPAP at [facility]12cmH2O per home settings via ffm. Pt is wearing ETCO2 nasal cannula with 2L of oxygen bled in beneath CPAP mask. Sterile water was added for humidification. Pt states he is comfortable and is tolerating CPAP well at this time. RT will continue to monitor as needed.

## 2014-05-28 ENCOUNTER — Encounter (HOSPITAL_COMMUNITY): Payer: Self-pay | Admitting: Cardiology

## 2014-05-28 LAB — CBC WITH DIFFERENTIAL/PLATELET
BASOS ABS: 0.1 10*3/uL (ref 0.0–0.1)
BASOS PCT: 1 % (ref 0–1)
Eosinophils Absolute: 0.3 10*3/uL (ref 0.0–0.7)
Eosinophils Relative: 2 % (ref 0–5)
HEMATOCRIT: 38.8 % — AB (ref 39.0–52.0)
Hemoglobin: 12.7 g/dL — ABNORMAL LOW (ref 13.0–17.0)
LYMPHS PCT: 28 % (ref 12–46)
Lymphs Abs: 3.2 10*3/uL (ref 0.7–4.0)
MCH: 29.7 pg (ref 26.0–34.0)
MCHC: 32.7 g/dL (ref 30.0–36.0)
MCV: 90.7 fL (ref 78.0–100.0)
MONO ABS: 1 10*3/uL (ref 0.1–1.0)
Monocytes Relative: 9 % (ref 3–12)
NEUTROS ABS: 6.9 10*3/uL (ref 1.7–7.7)
Neutrophils Relative %: 60 % (ref 43–77)
PLATELETS: 283 10*3/uL (ref 150–400)
RBC: 4.28 MIL/uL (ref 4.22–5.81)
RDW: 14.8 % (ref 11.5–15.5)
WBC: 11.4 10*3/uL — ABNORMAL HIGH (ref 4.0–10.5)

## 2014-05-28 LAB — GLUCOSE, CAPILLARY
GLUCOSE-CAPILLARY: 90 mg/dL (ref 70–99)
GLUCOSE-CAPILLARY: 86 mg/dL (ref 70–99)
Glucose-Capillary: 87 mg/dL (ref 70–99)

## 2014-05-28 MED ORDER — OXYCODONE HCL 5 MG/5ML PO SOLN: 5.0000 mg | ORAL | Status: DC | PRN

## 2014-05-28 MED ORDER — PANTOPRAZOLE SODIUM 40 MG PO TBEC: 40.0000 mg | DELAYED_RELEASE_TABLET | Freq: Every day | ORAL | Status: DC

## 2014-05-28 MED ORDER — ENOXAPARIN (LOVENOX) PATIENT EDUCATION KIT
PACK | Freq: Once | Status: AC
  Administered 2014-05-28: 09:00:00
  Filled 2014-05-28: qty 1

## 2014-05-28 MED ORDER — ENOXAPARIN SODIUM 30 MG/0.3ML ~~LOC~~ SOLN: 30.0000 mg | Freq: Two times a day (BID) | SUBCUTANEOUS | Status: DC

## 2014-05-28 NOTE — Plan of Care (Signed)
Problem: Phase II Progression Outcomes Goal: Activity at appropriate level-compared to baseline (UP IN CHAIR FOR HEMODIALYSIS)  Outcome: Completed/Met Date Met:  05/28/14 Goal: Hemodynamically stable Outcome: Completed/Met Date Met:  05/28/14 Goal: Pulmonary function stable Outcome: Completed/Met Date Met:  05/28/14 Goal: CPAP/BI-PAP utilized with sleeping per order Outcome: Completed/Met Date Met:  05/28/14 Goal: Surgical site without signs of infection Outcome: Completed/Met Date Met:  05/28/14

## 2014-05-28 NOTE — Discharge Instructions (Signed)
GASTRIC BYPASS/SLEEVE  Home Care Instructions   These instructions are to help you care for yourself when you go home.  Monitor your blood pressure several times a week. If you notice it is increasing (>160/90) or having fluid in legs please call your PCP or me - may need to restart your other BP medication  Call: If you have any problems.  Call [phone] and ask for the surgeon on call  If you need immediate assistance come to the ER at [facility]. Tell the ER staff you are a new post-op gastric bypass or gastric sleeve patient  Signs and symptoms to report:  Severe  vomiting or nausea o If you cannot handle clear liquids for longer than 1 day, call your surgeon  Abdominal pain which does not get better after taking your pain medication  Fever greater than 100.4  F and chills  Heart rate over 100 beats a minute  Trouble breathing  Chest pain  Redness,  swelling, drainage, or foul odor at incision (surgical) sites  If your incisions open or pull apart  Swelling or pain in calf (lower leg)  Diarrhea (Loose bowel movements that happen often), frequent watery, uncontrolled bowel movements  Constipation, (no bowel movements for 3 days) if this happens: o Take Milk of Magnesia, 2 tablespoons by mouth, 3 times a day for 2 days if needed o Stop taking Milk of Magnesia once you have had a bowel movement o Call your doctor if constipation continues Or o Take Miralax  (instead of Milk of Magnesia) following the label instructions o Stop taking Miralax once you have had a bowel movement o Call your doctor if constipation continues  Anything you think is abnormal for you   Normal side effects after surgery:  Unable to sleep at night or unable to concentrate  Irritability  Being tearful (crying) or depressed  These are common complaints, possibly related to your anesthesia, stress of surgery, and change in lifestyle, that usually go away a few weeks after  surgery. If these feelings continue, call your medical doctor.  Wound Care: You may have surgical glue, steri-strips, or staples over your incisions after surgery  Surgical glue: Looks like clear film over your incisions and will wear off a little at a time  Steri-strips: Adhesive strips of tape over your incisions. You may notice a yellowish color on skin under the steri-strips. This is used to make the steri-strips stick better. Do not pull the steri-strips off - let them fall off  Staples: Staples may be removed before you leave the hospital o If you go home with staples, call [facility] for an appointment with your surgeons nurse to have staples removed 10 days after surgery, [phone]  Showering: You may shower two (2) days after your surgery unless your surgeon tells you differently o Wash gently around incisions with warm soapy water, rinse well, and gently pat dry o If you have a drain (tube from your incision), you may need someone to hold this while you shower o No tub baths until staples are removed and incisions are healed   Medications:  Medications should be liquid or crushed if larger than the size of a dime  Extended release pills (medication that releases a little bit at a time through the  day) should not be crushed  Depending on the size and number of medications you take, you may need to space (take a few throughout the day)/change the time you  take your medications so that you do not over-fill your pouch (smaller stomach)  Make sure you follow-up with you primary care physician to make medication changes needed during rapid weight loss and life -style changes  If you have diabetes, follow up with your doctor that orders your diabetes medication(s) within one week after surgery and check your blood sugar regularly   Do not drive while taking narcotics (pain medications)   Do not take acetaminophen (Tylenol) and Roxicet or Lortab Elixir at the same  time since these pain medications contain acetaminophen   Diet:  First 2 Weeks You will see the nutritionist about two (2) weeks after your surgery. The nutritionist will increase the types of foods you can eat if you are handling liquids well:  If you have severe vomiting or nausea and cannot handle clear liquids lasting longer than 1 day call your surgeon Protein Shake  Drink at least 2 ounces of shake 5-6 times per day  Each serving of protein shakes (usually 8-12 ounces) should have a minimum of: o 15 grams of protein o And no more than 5 grams of carbohydrate  Goal for protein each day: o Men = 80 grams per day o Women = 60 grams per day     Protein powder may be added to fluids such as non-fat milk or Lactaid milk or Soy milk (limit to 35 grams added protein powder per serving)  Hydration  Slowly increase the amount of water and other clear liquids as tolerated (See Acceptable Fluids)  Slowly increase the amount of protein shake as tolerated  Sip fluids slowly and throughout the day  May use sugar substitutes in small amounts (no more than 6-8 packets per day; i.e. Splenda)  Fluid Goal  The first goal is to drink at least 8 ounces of protein shake/drink per day (or as directed by the nutritionist); some examples of protein shakes are [employer] Nectar, Dillard'sdkins Advantage, EAS Edge HP, and Unjury. - See handout from pre-op Bariatric Education Class: o Slowly increase the amount of protein shake you drink as tolerated o You may find it easier to slowly sip shakes throughout the day o It is important to get your proteins in first  Your fluid goal is to drink 64-100 ounces of fluid daily o It may take a few weeks to build up to this   32 oz. (or more) should be clear liquids And  32 oz. (or more) should be full liquids (see below for examples)  Liquids should not contain sugar, caffeine, or carbonation  Clear Liquids:  Water of Sugar-free flavored water (i.e. Fruit HO,  Propel)  Decaffeinated coffee or tea (sugar-free)  Crystal lite, Wylers Lite, Minute Maid Lite  Sugar-free Jell-O  Bouillon or broth  Sugar-free Popsicle:    - Less than 20 calories each; Limit 1 per day  Full Liquids:                   Protein Shakes/Drinks + 2 choices per day of other full liquids  Full liquids must be: o No More Than 12 grams of Carbs per serving o No More Than 3 grams of Fat per serving  Strained low-fat cream soup  Non-Fat milk  Fat-free Lactaid Milk  Sugar-free yogurt (Dannon Lite & Fit, Greek yogurt)    Vitamins and Minerals  Start 1 day after surgery unless otherwise directed by your surgeon  2 Chewable Multivitamin / Multimineral Supplement with iron (i.e. Centrum for Adults)  Vitamin B-12,  350-500 micrograms sub-lingual (place tablet under the tongue) each day  Chewable Calcium Citrate with Vitamin D-3 (Example: 3 Chewable Calcium  Plus 600 with Vitamin D-3) o Take 500 mg three (3) times a day for a total of 1500 mg each day o Do not take all 3 doses of calcium at one time as it may cause constipation, and you can only absorb 500 mg at a time o Do not mix multivitamins containing iron with calcium supplements;  take 2 hours apart o Do not substitute Tums (calcium carbonate) for your calcium  Menstruating women and those at risk for anemia ( a blood disease that causes weakness) may need extra iron o Talk to your doctor to see if you need more iron  If you need extra iron: Total daily Iron recommendation (including Vitamins) is 50 to 100 mg Iron/day  Do not stop taking or change any vitamins or minerals until you talk to your nutritionist or surgeon  Your nutritionist and/or surgeon must approve all vitamin and mineral supplements   Activity and Exercise: It is important to continue walking at home. Limit your physical activity as instructed by your doctor. During this time, use these guidelines:  Do not lift anything greater than ten   (10) pounds for at least two (2) weeks  Do not go back to work or drive until Designer, industrial/product says you can  You may have sex when you feel comfortable o It is VERY important for male patients to use a reliable birth control method; fertility often increase after surgery o Do not get pregnant for at least 18 months  Start exercising as soon as your doctor tells you that you can o Make sure your doctor approves any physical activity  Start with a simple walking program  Walk 5-15 minutes each day, 7 days per week  Slowly increase until you are walking 30-45 minutes per day  Consider joining our BELT program. [phone] or email [email or web address]   Special Instructions Things to remember:  Free counseling is available for you and your family through collaboration between [facility] and [facility]. Please call [phone] and leave a message  Use your CPAP when sleeping if this applies to you  Consider buying a medical alert bracelet that says you had lap-band surgery     You will likely have your first fill (fluid added to your band) 6 - 8 weeks after surgery  [facility] has a free Bariatric Surgery Support Group that meets monthly, the 3rd Thursday, 6pm. Calvert Cantor. You can see classes online at [email or web address]  It is very important to keep all follow up appointments with your surgeon, nutritionist, primary care physician, and behavioral health practitioner o After the first year, please follow up with your bariatric surgeon and nutritionist at least once a year in order to maintain best weight loss results                    [facility]:  [phone]               [facility]: [phone]               Bariatric Nurse Coordinator: [phone]  Gastric Bypass/Sleeve Home Care Instructions  Rev. 07/2012  Reviewed and  Endorsed                                                    by [facility] Patient Education Committee, Jan, 2014   Enoxaparin, Home Use Enoxaparin (Lovenox) injection is a medication used to prevent clots from developing in your veins. Medications such as enoxaparin are called blood thinners or anticoagulants. If blood clots are untreated they could travel to your lungs. This is called a pulmonary embolus. A blood clot in your lungs can be fatal. Caregivers often use anticoagulants such as enoxaparin to prevent clots following surgery. It is also used along with aspirin when the heart is not getting enough blood. Continue the enoxaparin injections as directed by your caregiver. Your caregiver will use blood clotting test results to decide when you can safely stop using enoxaparin injections. If your caregiver prescribes any additional anticoagulant, you must take it exactly as directed. RISKS AND COMPLICATIONS  If you have received recent epidural anesthesia, spinal anesthesia, or a spinal tap while receiving anticoagulants, you are at risk for developing a blood clot in or around the spine. This condition could result in long-term or permanent paralysis.  Because anticoagulants thin your blood, severe bleeding may occur from any tissue or organ. Symptoms of the blood being too thin may include:  Bleeding from the nose or gums that does not stop quickly.  Unusual bruising or bruising easily.  Swelling or pain at an injection site.  A cut that does not stop bleeding within 10 minutes.  Continual nausea for more than 1 day or vomiting blood.  Coughing up blood.  Blood in the urine which may appear as pink, red, or brown urine.  Blood in bowel movements which may appear as red, dark or black stools.  Sudden weakness or numbness of the face, arm, or leg, especially on one side of the body.  Sudden confusion.  Trouble speaking (aphasia) or understanding.  Sudden trouble seeing in one  or both eyes.  Sudden trouble walking.  Dizziness.  Loss of balance or coordination.  Severe pain, such as a headache, joint pain, or back pain.  Fever.  Bruising around the injection sites may be expected.  Platelet drops, known as "thrombocytopenia," can occur with enoxaparin use. A condition called "heparin-induced thrombocytopenia" has been seen. If you have had this condition, you should tell your caregiver. Your caregiver may direct you to have blood tests to monitor this condition.  Do not use if you have allergies to the medication, heparin, or pork products.  Other side effects may include mild local reactions or irritation at the site of injection, pain, bruising, and redness of skin. HOME CARE INSTRUCTIONS You will be instructed by your caregiver how to give enoxaparin injections. 1. Before giving your medication you should make sure the injection is a clear and colorless or pale yellow solution. If your medication becomes discolored or has particles in the bottle, do not use and notify your caregiver. 2. When using the 30 and 40 mg pre-filled syringes, do not expel the air bubble from the syringe before the injection. This makes sure you use all the medication in the syringe. 3. The injections will be given subcutaneously. This means it is given into the fat over the belly (abdomen). It is given deep beneath the skin but not  into the muscle. The shots should be injected around the abdominal wall. Change the sites of injection each time. The whole length of the needle should be introduced into a skin fold held between the thumb and forefinger; the skin fold should be held throughout the injection. Do not rub the injection site after completion of the injection. This increases bruising. Enoxaparin injection pre-filled syringes and graduated pre-filled syringes are available with a system that shields the needle after injection. 4. Inject by pushing the plunger to the bottom of the  syringe. 5. Remove the syringe from the injection site keeping your finger on the plunger rod. Be careful not to stick yourself or others. 6. After injection and the syringe is empty, set off the safety system by firmly pushing the plunger rod. The protective sleeve will automatically cover the needle and you can hear a click. The click means your needle is safely covered. Do not try replacing the needle shield. 7. Get rid of the syringe in the nearest sharps container. 8. Keep your medication safely stored at room temperatures.  Due to the complications of anticoagulants, it is very important that you take your anticoagulant as directed by your caregiver. Anticoagulants need to be taken exactly as instructed. Be sure you understand all your anticoagulant instructions.  Changes in medicines, supplements, diet, and illness can affect your anticoagulation therapy. Be sure to inform your caregivers of any of these changes.  While on anticoagulants, you will need to have blood tests done routinely as directed by your caregivers.  Be careful not to cut yourself when using sharp objects.  Limit physical activities or sports that could result in a fall or cause injury.  It is extremely important that you tell all of your caregivers and dentist that you are taking an anticoagulant, especially if you are injured or plan to have any type of procedure or operation.  Follow up with your laboratory test and caregiver appointments as directed. It is very important to keep your appointments. Not keeping appointments could result in a chronic or permanent injury, pain, or disability. SEEK MEDICAL CARE IF:  You develop any rashes.  You have any worsening of the condition for which you are receiving anticoagulation therapy. SEEK IMMEDIATE MEDICAL CARE IF:  Bleeding from the nose or gums does not stop quickly.  You have unusual bruising or are bruising easily.  Swelling or pain occurs at an injection  site.  A cut does not stop bleeding within 10 minutes.  You have continual nausea for more than 1 day or are vomiting blood.  You are coughing up blood.  You have blood in the urine.  You have dark or black stools.  You have sudden weakness or numbness of the face, arm, or leg, especially on one side of the body.  You have sudden confusion.  You have trouble speaking (aphasia) or understanding.  You have sudden trouble seeing in one or both eyes.  You have sudden trouble walking.  You have dizziness.  You have a loss of balance or coordination.  You have severe pain, such as a headache, joint pain, or back pain.  You have a serious fall or head injury, even if you are not bleeding.  You have an oral temperature above 102 F (38.9 C), not controlled by medicine. ANY OF THESE SYMPTOMS MAY REPRESENT A SERIOUS PROBLEM THAT IS AN EMERGENCY. Do not wait to see if the symptoms will go away. Get medical help right away. Call your local  emergency services (911 in [place] DO NOT drive yourself to the hospital. MAKE SURE YOU:  Understand these instructions.  Will watch your condition.  Will get help right away if you are not doing well or get worse. Document Released: 04/06/2004 Document Revised: 08/28/2011 Document Reviewed: 06/05/2005 [facility] Patient Information 2015 [place], [place]. This information is not intended to replace advice given to you by your health care provider. Make sure you discuss any questions you have with your health care provider.

## 2014-05-28 NOTE — Progress Notes (Signed)
Patient alert and oriented, pain is controlled. Patient is tolerating fluids, advanced to protein shake today, patient tolerating well. Reviewed Gastric Bypass discharge instructions with patient and patient is able to articulate understanding. Provided information on BELT program, Support Group and [facility] outpatient pharmacy. All questions answered, will continue to monitor.    

## 2014-05-28 NOTE — Progress Notes (Signed)
Nurse reviewed discharge instructions with pt and his wife.  Pt verbalized understanding of discharge instructions, new medications and follow up appointments.  Pt's wife had her pharmacy [facility].  No other concerns at time of discharge.

## 2014-05-28 NOTE — Discharge Summary (Signed)
Physician Discharge Summary  Alphia MohWilliam C Terral WUJ:[id number] DOB: [birth year] DOA: 05/26/2014  PCP: Carney LivingHAMBLISS,MARSHALL L, MD  Admit date: 05/26/2014 Discharge date: 05/28/2014  Recommendations for Outpatient Follow-up:   Follow-up Information    Follow up with Atilano InaWILSON,Gordan Grell M, MD On 06/10/2014.   Specialty:  General Surgery   Why:  9:30 AM (arrive 9:15) , For wound re-check   Contact information:   [address] [place] [address] [phone]      Discharge Diagnoses:  Active Problems:   HYPERTENSION, BENIGN   Sleep apnea   Morbid obesity with BMI of 50.0-59.9, adult   Prediabetes   Surgical Procedure: Laparoscopic Roux-en-Y gastric bypass, upper endoscopy  Discharge Condition: Good Disposition: Home  Diet recommendation: Postoperative gastric bypass diet  Filed Weights   05/26/14 0509 05/26/14 1356  Weight: 390 lb 4 oz (177.016 kg) 390 lb 4 oz (177.016 kg)     Hospital Course:  The patient was admitted for a planned laparoscopic Roux-en-Y gastric bypass. Please see operative note. Preoperatively the patient was given 5000 units of subcutaneous heparin for DVT prophylaxis. Postoperative prophylactic Lovenox dosing was started on the morning of postoperative day 1. The patient underwent an upper GI on postoperative day 1 which demonstrated no extravasation of contrast and emptying of the contrast into the Roux limb. The patient was started on ice chips and water which they tolerated. On postoperative day 2 The patient's diet was advanced to protein shakes which they also tolerated. The patient was ambulating without difficulty. Their vital signs are stable without fever or tachycardia. Their hemoglobin had remained stable. The patient was maintained on their home settings for CPAP therapy. The patient had received discharge instructions and counseling. They were deemed stable for discharge. He will also go home on prophylactic lovenox for DVT prevention. He  received lovenox teaching. I am holding his combo BP pill for now since his BP has been ok just with his beta blocker   Discharge Instructions      Discharge Instructions    Discharge instructions    Complete by:  As directed   SEE BARIATRIC DISCHARGE INSTRUCTIONS     Increase activity slowly    Complete by:  As directed             Medication List    STOP taking these medications        lisinopril-hydrochlorothiazide 20-25 MG per tablet  Commonly known as:  PRINZIDE,ZESTORETIC     oxyCODONE-acetaminophen 10-325 MG per tablet  Commonly known as:  PERCOCET      TAKE these medications        allopurinol 300 MG tablet  Commonly known as:  ZYLOPRIM  Take 300 mg by mouth every morning.     atorvastatin 80 MG tablet  Commonly known as:  LIPITOR  Take 80 mg by mouth every evening.     AZO CRANBERRY PO  Take 2 tablets by mouth every morning.     carvedilol 6.25 MG tablet  Commonly known as:  COREG  Take 1 tablet (6.25 mg total) by mouth 2 (two) times daily with a meal.     enoxaparin 30 MG/0.3ML injection  Commonly known as:  LOVENOX  Inject 0.3 mLs (30 mg total) into the skin every 12 (twelve) hours.     etodolac 400 MG tablet  Commonly known as:  LODINE  Take 400 mg by mouth every morning.     furosemide 20 MG tablet  Commonly known as:  LASIX  Take 20 mg by mouth daily as needed for fluid. To get rid of some of the fluid in legs     multivitamin with minerals Tabs tablet  Take 1 tablet by mouth every morning.     nitroGLYCERIN 0.4 MG SL tablet  Commonly known as:  NITROSTAT  Place 0.4 mg under the tongue every 5 (five) minutes as needed for chest pain.     oxyCODONE 5 MG/5ML solution  Commonly known as:  ROXICODONE  Take 5-10 mLs (5-10 mg total) by mouth every 4 (four) hours as needed for moderate pain or severe pain.     pantoprazole 40 MG tablet  Commonly known as:  PROTONIX  Take 1 tablet (40 mg total) by mouth daily.     VITAMIN D PO  Take 1  tablet by mouth every morning.     Zinc 50 MG Tabs  Take 1 tablet by mouth every morning.       Follow-up Information    Follow up with Atilano Ina, MD On 06/10/2014.   Specialty:  General Surgery   Why:  9:30 AM (arrive 9:15) , For wound re-check   Contact information:   [address] [place] [address] [phone]        The results of significant diagnostics from this hospitalization (including imaging, microbiology, ancillary and laboratory) are listed below for reference.    Significant Diagnostic Studies: Dg Ugi W/water Sol Cm  05/27/2014   CLINICAL DATA:  43 year old male status post Roux-en-Y gastric bypass performed on 05/26/2014.  EXAM: WATER SOLUBLE UPPER GI SERIES  TECHNIQUE: Single-column upper GI series was performed using water soluble contrast.  CONTRAST:  40mL OMNIPAQUE IOHEXOL 300 MG/ML  SOLN  COMPARISON:  03/02/2014.  FLUOROSCOPY TIME:  1 min and 33 seconds.  FINDINGS: Multiple fluoroscopic images were obtained following ingestion of water-soluble contrast, which demonstrated a normal appearance of the distal esophagus and gastroesophageal junction. The gastric remnant was normal in appearance, and there was no evidence of extravasation from the remnant, or the anastomosis with the Roux limb. Contrast readily traverses the anastomosis into the small bowel via the gastrojejunostomy.  IMPRESSION: 1. Expected postoperative appearance following Roux-en-Y gastric bypass, without acute complicating features, as above.   Electronically Signed   By: Trudie Reed M.D.   On: 05/27/2014 09:23    Labs: Basic Metabolic Panel:  Recent Labs Lab 05/27/14 0350  NA 141  K 4.3  CL 101  CO2 25  GLUCOSE 110*  BUN 11  CREATININE 0.86  CALCIUM 9.1   Liver Function Tests:  Recent Labs Lab 05/27/14 0350  AST 23  ALT 52  ALKPHOS 67  BILITOT 0.6  PROT 6.6  ALBUMIN 3.6    CBC:  Recent Labs Lab 05/26/14 1428 05/27/14 0350 05/27/14 1607  05/28/14 0408  WBC  --  13.9*  --  11.4*  NEUTROABS  --  10.4*  --  6.9  HGB 13.6 13.1 13.3 12.7*  HCT 40.6 39.4 39.3 38.8*  MCV  --  90.0  --  90.7  PLT  --  303  --  283    CBG:  Recent Labs Lab 05/27/14 1639 05/27/14 1955 05/27/14 2353 05/28/14 0408 05/28/14 0723  GLUCAP 97 91 80 90 86    Active Problems:   HYPERTENSION, BENIGN   Sleep apnea   Morbid obesity with BMI of 50.0-59.9, adult   Prediabetes   Time coordinating discharge: 15 minutes  Signed:  Atilano Ina, MD FACS  [facility], [place] [phone] 05/28/2014, 12:31 PM

## 2014-05-28 NOTE — Progress Notes (Signed)
2 Days Post-Op  Subjective: Doing well. Lots of flatus. Minimal nausea. Tolerated water. Ambulated multiple times  Objective: Vital signs in last 24 hours: Temp:  [97.8 F (36.6 C)-98.2 F (36.8 C)] 97.8 F (36.6 C) (12/10 0540) Pulse Rate:  [71-90] 81 (12/10 0540) Resp:  [18-20] 20 (12/10 0540) BP: (98-147)/(52-83) 125/80 mmHg (12/10 0540) SpO2:  [94 %-100 %] 100 % (12/10 0540) Last BM Date: 05/25/14  Intake/Output from previous day: 12/09 0701 - 12/10 0700 In: 3180 [P.O.:180; I.V.:3000] Out: 3300 [Urine:3300] Intake/Output this shift:    Alert, nad, sitting in chair cta b/l Reg Obese, soft, incisions c/d/i; nontender No edema  Lab Results:   Recent Labs  05/27/14 0350 05/27/14 1607 05/28/14 0408  WBC 13.9*  --  11.4*  HGB 13.1 13.3 12.7*  HCT 39.4 39.3 38.8*  PLT 303  --  283   BMET  Recent Labs  05/27/14 0350  NA 141  K 4.3  CL 101  CO2 25  GLUCOSE 110*  BUN 11  CREATININE 0.86  CALCIUM 9.1   PT/INR No results for input(s): LABPROT, INR in the last 72 hours. ABG No results for input(s): PHART, HCO3 in the last 72 hours.  Invalid input(s): PCO2, PO2  Studies/Results: Dg Ugi W/water Sol Cm  05/27/2014   CLINICAL DATA:  43 year old male status post Roux-en-Y gastric bypass performed on 05/26/2014.  EXAM: WATER SOLUBLE UPPER GI SERIES  TECHNIQUE: Single-column upper GI series was performed using water soluble contrast.  CONTRAST:  40mL OMNIPAQUE IOHEXOL 300 MG/ML  SOLN  COMPARISON:  03/02/2014.  FLUOROSCOPY TIME:  1 min and 33 seconds.  FINDINGS: Multiple fluoroscopic images were obtained following ingestion of water-soluble contrast, which demonstrated a normal appearance of the distal esophagus and gastroesophageal junction. The gastric remnant was normal in appearance, and there was no evidence of extravasation from the remnant, or the anastomosis with the Roux limb. Contrast readily traverses the anastomosis into the small bowel via the  gastrojejunostomy.  IMPRESSION: 1. Expected postoperative appearance following Roux-en-Y gastric bypass, without acute complicating features, as above.   Electronically Signed   By: Justin Reedaniel  [name] [name].D.   On: 05/27/2014 09:23    Anti-infectives: Anti-infectives    Start     Dose/Rate Route Frequency Ordered Stop   05/26/14 0915  cefOXitin (MEFOXIN) 2 g in dextrose 5 % 50 mL IVPB     2 g100 mL/hr over 30 Minutes Intravenous  Once 05/26/14 0914 05/26/14 0922   05/26/14 0516  cefOXitin (MEFOXIN) 2 g in dextrose 5 % 50 mL IVPB     2 g100 mL/hr over 30 Minutes Intravenous On call to O.R. 05/26/14 0516 05/26/14 0715      Assessment/Plan: s/p Procedure(s): LAPAROSCOPIC ROUX-EN-Y GASTRIC BYPASS WITH UPPER ENDOSCOPY (N/A) UPPER GI ENDOSCOPY Active Problems:   HYPERTENSION, BENIGN   Sleep apnea   Morbid obesity with BMI of 50.0-59.9, adult   Prediabetes  No fever. Looks good. Adv to POD 2 diet.  Blood sugars ok Cont pulm toilet hgb ok. Cont chemical VTE prophylaxis Will send home on lovenox injections given BMI and risk of DVT anticiptate d/c later today Discussed d/c instructions.  Will hold his combo BP pill Discussed with him checking his BP  Justin SellaEric [name]. [name], MD, FACS General, Bariatric, & Minimally Invasive Surgery [facility], [place]   LOS: 2 days    Justin [name],Justin Wise [name] 05/28/2014

## 2014-05-28 NOTE — Plan of Care (Signed)
Problem: Food- and Nutrition-Related Knowledge Deficit (NB-1.1) Goal: Nutrition education Formal process to instruct or train a patient/client in a skill or to impart knowledge to help patients/clients voluntarily manage or modify food choices and eating behavior to maintain or improve health. Outcome: Completed/Met Date Met:  05/28/14 Nutrition Education Note  Received consult for diet education per DROP protocol.   12/8 s/p Procedure(s): LAPAROSCOPIC ROUX-EN-Y GASTRIC BYPASS WITH UPPER ENDOSCOPY (N/A) UPPER GI ENDOSCOPY  Discussed 2 week post op diet with pt. Emphasized that liquids must be non carbonated, non caffeinated, and sugar free. Fluid goals discussed. Pt to follow up with outpatient bariatric RD for further diet progression after 2 weeks. Multivitamins and minerals also reviewed. Teach back method used, pt expressed understanding, expect good compliance.   Diet: First 2 Weeks  You will see the nutritionist about two (2) weeks after your surgery. The nutritionist will increase the types of foods you can eat if you are handling liquids well:  If you have severe vomiting or nausea and cannot handle clear liquids lasting longer than 1 day, call your surgeon  Protein Shake  Drink at least 2 ounces of shake 5-6 times per day  Each serving of protein shakes (usually 8 - 12 ounces) should have a minimum of:  15 grams of protein  And no more than 5 grams of carbohydrate  Goal for protein each day:  Men = 80 grams per day  Women = 60 grams per day  Protein powder may be added to fluids such as non-fat milk or Lactaid milk or Soy milk (limit to 35 grams added protein powder per serving)   Hydration  Slowly increase the amount of water and other clear liquids as tolerated (See Acceptable Fluids)  Slowly increase the amount of protein shake as tolerated  Sip fluids slowly and throughout the day  May use sugar substitutes in small amounts (no more than 6 - 8 packets per day; i.e.  Splenda)   Fluid Goal  The first goal is to drink at least 8 ounces of protein shake/drink per day (or as directed by the nutritionist); some examples of protein shakes are [employer], [employer]R [employer], EAS Edge HP, and Unjury. See handout from pre-op Bariatric Education Class:  Slowly increase the amount of protein shake you drink as tolerated  You may find it easier to slowly sip shakes throughout the day  It is important to get your proteins in first  Your fluid goal is to drink 64 - 100 ounces of fluid daily  It may take a few weeks to build up to this  32 oz (or more) should be clear liquids  And  32 oz (or more) should be full liquids (see below for examples)  Liquids should not contain sugar, caffeine, or carbonation   Clear Liquids:  Water or Sugar-free flavored water (i.e. Fruit H2O, Propel)  Decaffeinated coffee or tea (sugar-free)  Crystal Lite, Wyler's Lite, Minute Maid Lite  Sugar-free Jell-O  Bouillon or broth  Sugar-free Popsicle: *Less than 20 calories each; Limit 1 per day   Full Liquids:  Protein Shakes/Drinks + 2 choices per day of other full liquids  Full liquids must be:  No More Than 12 grams of Carbs per serving  No More Than 3 grams of Fat per serving  Strained low-fat cream soup  Non-Fat milk  Fat-free Lactaid Milk  Sugar-free yogurt (Dannon Lite & Fit, Greek yogurt)     Justin Bibles, MS, RD, LDN Pager: [phone] After Hours  Pager: [phone]

## 2014-06-01 ENCOUNTER — Telehealth (HOSPITAL_COMMUNITY): Payer: Self-pay

## 2014-06-01 NOTE — Telephone Encounter (Addendum)
Left message for patient to call back, patient returned call 06/01/14 @439   Made discharge phone call to patient per DROP protocol. Asking the following questions.    1. Do you have someone to care for you now that you are home?  yes 2. Are you having pain now that is not relieved by your pain medication?  no 3. Are you able to drink the recommended daily amount of fluids (48 ounces minimum/day) and protein (60-80 grams/day) as prescribed by the dietitian or nutritional counselor?   4. Are you taking the vitamins and minerals as prescribed?  yes 5. Do you have the "on call" number to contact your surgeon if you have a problem or question?  yes 6. Are your incisions free of redness, swelling or drainage? (If steri strips, address that these can fall off, shower as tolerated) yes 7. Have your bowels moved since your surgery?  If not, are you passing gas?  yes 8. Are you up and walking 3-4 times per day?  yes    1. Do you have an appointment made to see your surgeon in the next month?yes   2. Were you provided your discharge medications before your surgery or before you were discharged from the hospital and are you taking them without problem?  yes 3. Were you provided phone numbers to the clinic/surgeon's office?  yes 4. Did you watch the patient education video module in the (clinic, surgeon's office, etc.) before your surgery? yes 5. Do you have a discharge checklist that was provided to you in the hospital to reference with instructions on how to take care of yourself after surgery?  yes 6. Did you see a dietitian or nutritional counselor while you were in the hospital?  yes 7. Do you have an appointment to see a dietitian or nutritional counselor in the next month?  yes

## 2014-06-09 ENCOUNTER — Encounter: Payer: No Typology Code available for payment source | Attending: General Surgery

## 2014-06-09 VITALS — Ht 69.0 in | Wt 373.5 lb

## 2014-06-09 DIAGNOSIS — Z713 Dietary counseling and surveillance: Secondary | ICD-10-CM | POA: Insufficient documentation

## 2014-06-09 DIAGNOSIS — Z6841 Body Mass Index (BMI) 40.0 and over, adult: Secondary | ICD-10-CM | POA: Diagnosis not present

## 2014-06-09 NOTE — Progress Notes (Signed)
Bariatric Class:  Appt start time: 1530 end time:  1630.  2 Week Post-Operative Nutrition Class  Patient was seen on 06/09/14 for Post-Operative Nutrition [facility].   Surgery date: 05/26/2014 Surgery type: RYGB Start weight at [facility]: 412 on 02/28/14 Weight today: 373.5 lbs  Weight change: 40 lbs  TANITA  BODY COMP RESULTS  05/11/14 06/09/14   BMI (kg/m^2) 61.1 55.2   Fat Mass (lbs) 243 216.0   Fat Free Mass (lbs) 170.5 157.5   Total Body Water (lbs) 125 115.5    The following the learning objectives were met by the patient during this course:  Identifies Phase 3A (Soft, High Proteins) Dietary Goals and will begin from 2 weeks post-operatively to 2 months post-operatively  Identifies appropriate sources of fluids and proteins   States protein recommendations and appropriate sources post-operatively  Identifies the need for appropriate texture modifications, mastication, and bite sizes when consuming solids  Identifies appropriate multivitamin and calcium sources post-operatively  Describes the need for physical activity post-operatively and will follow MD recommendations  States when to call healthcare provider regarding medication questions or post-operative complications  Handouts given during class include:  Phase 3A: Soft, High Protein Diet Handout  Follow-Up Plan: Patient will follow-up at [facility] in 6 weeks for 2 month post-op nutrition visit for diet advancement per MD.

## 2014-07-14 ENCOUNTER — Encounter: Payer: No Typology Code available for payment source | Attending: General Surgery | Admitting: Dietician

## 2014-07-14 VITALS — Ht 69.0 in | Wt 363.0 lb

## 2014-07-14 DIAGNOSIS — Z6841 Body Mass Index (BMI) 40.0 and over, adult: Secondary | ICD-10-CM | POA: Insufficient documentation

## 2014-07-14 DIAGNOSIS — Z713 Dietary counseling and surveillance: Secondary | ICD-10-CM | POA: Diagnosis not present

## 2014-07-14 NOTE — Patient Instructions (Signed)
Goals:  Follow Phase 3B: High Protein + Non-Starchy Vegetables  Eat 3-6 small meals/snacks, every 3-5 hrs  Increase lean protein foods to meet 80g goal (either 2 EAS or 1 Premier shake) or 3 oz of protein at each meal  Increase fluid intake to 64oz +  Avoid drinking 15 minutes before, during and 30 minutes after eating  Aim for >30 min of physical activity daily

## 2014-07-14 NOTE — Progress Notes (Signed)
  Follow-up visit:  8 Weeks Post-Operative RYGB Surgery  Medical Nutrition Therapy:  Appt start time: 1445 end time:  1515.  Primary concerns today: Post-operative Bariatric Surgery Nutrition Management. Returns with a 10.5 lbs weight loss since Post Op Class. Tolerating most foods. Threw up after eating chick fil a coleslaw.   Surgery date: 05/26/2014 Surgery type: [facility]YGB [facility] on 02/28/14 Weight today: 363.0 lbs lbs  Weight change: 10.5 lbs Total weight loss: 50.5 lbs  TANITA  BODY COMP RESULTS  05/11/14 06/09/14 07/14/14   BMI (kg/m^2) 61.1 55.2 53.6   Fat Mass (lbs) 243 216.0 164.0   Fat Free Mass (lbs) 170.5 157.5 199.0   Total Body Water (lbs) 125 115.5 145.5    Preferred Learning Style:   No preference indicated   Learning Readiness:   Ready  24-hr recall: B (AM): less than 1 scrambled egg and 1-2 oz of meat (12-15 g) Snk (AM): 1/2 EAS Protein shake (8.5 g) L (PM): 1-2 oz meat and green beans, zucchini or cheese (12-20 g) Snk (PM):1/2 EAS Protein shake (8.5 g)  D (PM): 1-2 oz meat and green beans, zucchini or cheese (12-20 g) Snk (PM): none  Fluid intake: 64 oz per day mostly water and a protein shake sometimes with packets Estimated total protein intake: 53-72 g  Medications: see list Supplementation: taking  Using straws: sometimes - with to go cup Drinking while eating: No  Hair loss: No Carbonated beverages: No N/V/D/C: threw up after eating coleslaw and chicken salad, taking miralax for constipation Dumping syndrome: No  Recent physical activity:  3 x week at [employer] for 20-40 minutes treadmill, bike, and weights and walks around parking lot at church  Progress Towards Goal(s):  In progress.  Handouts given during visit include:  Phase 3B High Protein + Non Starchy Vegetables   Nutritional Diagnosis:  -3.3 Overweight/obesity related to past poor dietary habits and physical inactivity as evidenced by patient w/ recent RYGB  surgery following dietary guidelines for continued weight loss.    Intervention:  Nutrition education/diet advancement. Goals:  Follow Phase 3B: High Protein + Non-Starchy Vegetables  Eat 3-6 small meals/snacks, every 3-5 hrs  Increase lean protein foods to meet 80g goal (either 2 EAS or 1 Premier shake) or 3 oz of protein at each meal  Increase fluid intake to 64oz +  Avoid drinking 15 minutes before, during and 30 minutes after eating  Aim for >30 min of physical activity daily  Teaching Method Utilized:  Visual Auditory Hands on  Barriers to learning/adherence to lifestyle change: none  Demonstrated degree of understanding via:  Teach Back   Monitoring/Evaluation:  Dietary intake, exercise, and body weight. Follow up in 1 months for 40month post-op visit.

## 2014-07-21 ENCOUNTER — Ambulatory Visit: Payer: No Typology Code available for payment source | Admitting: Dietician

## 2014-08-13 ENCOUNTER — Ambulatory Visit: Payer: No Typology Code available for payment source | Admitting: Dietician

## 2014-08-18 ENCOUNTER — Encounter: Payer: No Typology Code available for payment source | Attending: General Surgery | Admitting: Dietician

## 2014-08-18 VITALS — Ht 69.0 in | Wt 346.5 lb

## 2014-08-18 DIAGNOSIS — Z713 Dietary counseling and surveillance: Secondary | ICD-10-CM | POA: Diagnosis not present

## 2014-08-18 DIAGNOSIS — Z6841 Body Mass Index (BMI) 40.0 and over, adult: Secondary | ICD-10-CM | POA: Diagnosis not present

## 2014-08-18 NOTE — Progress Notes (Signed)
  Follow-up visit:  12 Weeks Post-Operative RYGB Surgery  Medical Nutrition Therapy:  Appt start time: 1400 end time:  1420.  Primary concerns today: Post-operative Bariatric Surgery Nutrition Management. Returns with a 16.5 lbs weight loss. Tolerating all foods except if he eats too quickly. Will throw up if eats too quickly. Started using his opposite hand to eat.   Surgery date: 05/26/2014 Surgery type: [facility]YGB [facility] on 02/28/14 Weight today: 346.5 lbs   Weight change: 16.5 lbs, 22.5 lbs fat mass loss Total weight loss: 67 lbs Weight loss goal: 250 lbs  TANITA  BODY COMP RESULTS  05/11/14 06/09/14 07/14/14 08/18/14   BMI (kg/m^2) 61.1 55.2 53.6 51.2   Fat Mass (lbs) 243 216.0 164.0 141.5   Fat Free Mass (lbs) 170.5 157.5 199.0 205.0   Total Body Water (lbs) 125 115.5 145.5 150.0    Preferred Learning Style:   No preference indicated   Learning Readiness:   Ready  24-hr recall: B (AM): less than 1 scrambled egg and 1-2 oz of meat (12-15 g) Snk (AM): EAS Protein shake (17 g) L (PM): 1-2 oz meat and green beans, zucchini or cheese (12-20 g) Snk (PM):EAS Protein shake (17 g)  D (PM): Wendy's chili or chicken nuggets without breading 2 oz meat and green beans, zucchini or cheese (12-20 g) Snk (PM): none  Fluid intake: 64 oz per day mostly water and a protein shake, Powerade zero unsweet tea Estimated total protein intake: ~86g  Medications: see list Supplementation: taking, started using calcium citrate  Using straws: uses about once per day Drinking while eating: No - trying to wait 30 minutes to drink Hair loss: No Carbonated beverages: No N/V/D/C: threw up a few times after eating too quickly, taking miralax every 2-3 days for constipation Dumping syndrome: No  Recent physical activity:  2-3 x week at [employer] for 20-40 minutes treadmill, bike, and weights and walks around parking lot at church or at hospital  Progress Towards Goal(s):  In  progress.    Nutritional Diagnosis:  [place]-3.3 Overweight/obesity related to past poor dietary habits and physical inactivity as evidenced by patient w/ recent RYGB surgery following dietary guidelines for continued weight loss.    Intervention:  Nutrition education/diet advancement. Goals:  Follow Phase 3B: High Protein + Non-Starchy Vegetables  Eat 3-6 small meals/snacks, every 3-5 hrs  Increase lean protein foods to meet 80g goal   If you are able to eat 3 oz of protein at lunch and dinner you can have just 1 protein shake each day  Increase fluid intake to 64oz +  Avoid drinking 15 minutes before, during and 30 minutes after eating   Only use straws if you feel like you need them to get fluid in  Aim for >30 min of physical activity daily  Keep working on eating slowly  Teaching Method Utilized:  Visual Auditory Hands on  Barriers to learning/adherence to lifestyle change: none  Demonstrated degree of understanding via:  Teach Back   Monitoring/Evaluation:  Dietary intake, exercise, and body weight. Follow up in 3 months for 6 month post-op visit.

## 2014-08-18 NOTE — Patient Instructions (Addendum)
Goals:  Follow Phase 3B: High Protein + Non-Starchy Vegetables  Eat 3-6 small meals/snacks, every 3-5 hrs  Increase lean protein foods to meet 80g goal   If you are able to eat 3 oz of protein at lunch and dinner you can have just 1 protein shake each day  Increase fluid intake to 64oz +  Avoid drinking 15 minutes before, during and 30 minutes after eating   Only use straws if you feel like you need them to get fluid in  Aim for >30 min of physical activity daily  Keep working on eating slowly  Surgery date: 05/26/2014 Surgery type: RYGB [facility] on 02/28/14 Weight today: 346.5 lbs   Weight change: 16.5 lbs, 22.5 lbs fat mass loss Total weight loss: 67 lbs  TANITA  BODY COMP RESULTS  05/11/14 06/09/14 07/14/14 08/18/14   BMI (kg/m^2) 61.1 55.2 53.6 51.2   Fat Mass (lbs) 243 216.0 164.0 141.5   Fat Free Mass (lbs) 170.5 157.5 199.0 205.0   Total Body Water (lbs) 125 115.5 145.5 150.0

## 2014-09-09 ENCOUNTER — Ambulatory Visit (INDEPENDENT_AMBULATORY_CARE_PROVIDER_SITE_OTHER): Payer: No Typology Code available for payment source | Admitting: Family Medicine

## 2014-09-09 ENCOUNTER — Encounter: Payer: Self-pay | Admitting: Family Medicine

## 2014-09-09 VITALS — BP 112/79 | HR 57 | Temp 98.5°F | Ht 69.0 in | Wt 337.2 lb

## 2014-09-09 DIAGNOSIS — M1000 Idiopathic gout, unspecified site: Secondary | ICD-10-CM

## 2014-09-09 DIAGNOSIS — I10 Essential (primary) hypertension: Secondary | ICD-10-CM

## 2014-09-09 NOTE — Progress Notes (Signed)
   Subjective:    Patient ID: Justin [name], male    DOB: 24-Nov-1970, 44 y.o.   MRN: [id number]  HPI  Follow up after gastric surgery - Feels well  HYPERTENSION Disease Monitoring Home BP Monitoring not checking Chest pain- no    Dyspnea- no Medications Compliance-  Taking coreg stopped lisinopril. Lightheadedness-  no  Edema- no ROS - See HPI  PMH Lab Review   POTASSIUM  Date Value Ref Range Status  05/27/2014 4.3 3.7 - 5.3 mEq/L Final   SODIUM  Date Value Ref Range Status  05/27/2014 141 137 - 147 mEq/L Final   CREAT  Date Value Ref Range Status  04/23/2014 1.01 0.50 - 1.35 mg/dL Final   CREATININE, SER  Date Value Ref Range Status  05/27/2014 0.86 0.50 - 1.35 mg/dL Final      Edema Resolved after surgery.  No chest pain or shortness of breath or claudication  Gout - no flares pain in legs or hands.  Taking allopurinol still.  No rash  Chief Complaint noted Review of Symptoms - see HPI PMH - Smoking status noted.   Vital Signs reviewed       Review of Systems     Objective:   Physical Exam  Alert no acute distress Heart - Regular rate and rhythm.  No murmurs, gallops or rubs.    Lungs:  Normal respiratory effort, chest expands symmetrically. Lungs are clear to auscultation, no crackles or wheezes. Abdomen: soft and non-tender without masses, organomegaly or hernias noted.  No guarding or rebound Extremities:  No cyanosis, edema, or deformity noted with good range of motion of all major joints.         Assessment & Plan:

## 2014-09-09 NOTE — Assessment & Plan Note (Signed)
Stable.  Continues allopurinol.  Maybe able to stop if continues with weight loss and diet after we check a UA

## 2014-09-09 NOTE — Patient Instructions (Addendum)
Good to see you today!  Thanks for coming in.  Come in for fasting blood test - no food for 8 hours before - Call the day before  Encourage going to exercise at least 3 times a week 4-5 would be better  I will call you if your tests are not good.  Otherwise I will send you a letter.  If you do not hear from me with in 2 weeks please call our office.     Check your blood pressure regularly if it is less than 130/80 and it is less than this at Dr [name] office I would stop the Coreg and follow your blood pressure it should be less than 140/90 all the time  Come back in 6 months for a blood pressure check

## 2014-09-09 NOTE — Assessment & Plan Note (Signed)
At goal.  Hopefully can get off Coreg in future

## 2014-09-14 ENCOUNTER — Other Ambulatory Visit: Payer: Self-pay | Admitting: *Deleted

## 2014-09-14 MED ORDER — ALLOPURINOL 300 MG PO TABS: 300.0000 mg | ORAL_TABLET | Freq: Every morning | ORAL | Status: DC

## 2014-09-15 ENCOUNTER — Other Ambulatory Visit: Payer: Self-pay | Admitting: *Deleted

## 2014-09-15 MED ORDER — CARVEDILOL 6.25 MG PO TABS: 6.2500 mg | ORAL_TABLET | Freq: Two times a day (BID) | ORAL | Status: DC

## 2014-09-29 ENCOUNTER — Ambulatory Visit (INDEPENDENT_AMBULATORY_CARE_PROVIDER_SITE_OTHER): Payer: No Typology Code available for payment source | Admitting: Podiatry

## 2014-09-29 ENCOUNTER — Encounter: Payer: Self-pay | Admitting: Podiatry

## 2014-09-29 VITALS — BP 128/87 | HR 87 | Resp 16

## 2014-09-29 DIAGNOSIS — M7661 Achilles tendinitis, right leg: Secondary | ICD-10-CM

## 2014-09-29 DIAGNOSIS — M7662 Achilles tendinitis, left leg: Secondary | ICD-10-CM | POA: Diagnosis not present

## 2014-09-29 MED ORDER — OXYCODONE-ACETAMINOPHEN 10-325 MG PO TABS: 1.0000 | ORAL_TABLET | ORAL | Status: DC | PRN

## 2014-09-29 NOTE — Progress Notes (Signed)
Mr. Justin [name] presents today after having lost 81 pounds after his gastric bypass. He states that his right heel present relatively normally however his left heel is painful. He has chronic Achilles tendinitis of the left heel.  Objective: Vital signs are stable alert and oriented 3. Pulses are palpable left. Pain on palpation with erythema at the insertion site of the Achilles tendon left foot.  Assessment: Achilles tendinitis chronic in nature left.  Plan: Injected subcutaneously 2 mg of dexamethasone to alleviate his symptoms today and also a prescription for Percocet. [name] on an as-needed basis.

## 2014-10-06 ENCOUNTER — Ambulatory Visit: Payer: No Typology Code available for payment source | Admitting: Podiatry

## 2014-11-18 ENCOUNTER — Encounter: Payer: Self-pay | Admitting: Dietician

## 2014-11-18 ENCOUNTER — Encounter: Payer: No Typology Code available for payment source | Attending: General Surgery | Admitting: Dietician

## 2014-11-18 ENCOUNTER — Other Ambulatory Visit: Payer: Self-pay | Admitting: *Deleted

## 2014-11-18 VITALS — Ht 69.0 in | Wt 326.5 lb

## 2014-11-18 DIAGNOSIS — Z713 Dietary counseling and surveillance: Secondary | ICD-10-CM | POA: Diagnosis not present

## 2014-11-18 DIAGNOSIS — Z6841 Body Mass Index (BMI) 40.0 and over, adult: Secondary | ICD-10-CM | POA: Diagnosis not present

## 2014-11-18 NOTE — Patient Instructions (Addendum)
Goals:  Follow Phase 3B: High Protein + Non-Starchy Vegetables  Eat 3-6 small meals/snacks, every 3-5 hrs  Increase lean protein foods to meet 80g goal  Aim for 4 oz instead of 5 oz at lunch at dinner.  Have either cheese or milk for snack, skip snacks if you are not hungry  Have some carbs (fruit or bread) after eating protein and non starchy vegetables only if still hungry  Increase fluid intake to 64oz +  Avoid drinking 15 minutes before, during and 30 minutes after eating   Aim for >30 min of physical activity daily  Surgery date: 05/26/2014 Surgery type[facility]GB [facility] 02[facility]28/14 Weight today: 326.5 lbs   Weight change: 20 lbs Total weight loss: 87 lbs Weight loss goal: 250 lbs  TANITA  BODY COMP RESULTS  05/11/14 06/09/14 07/14/14 08/18/14 11/18/14   BMI (kg/m^2) 61.1 55.2 53.6 51.2 48.2   Fat Mass (lbs) 243 216.0 164.0 141.5 123.0   Fat Free Mass (lbs) 170.5 157.5 199.0 205.0 203.5   Total Body Water (lbs) 125 115.5 145.5 150.0 149.0

## 2014-11-18 NOTE — Progress Notes (Signed)
  Follow-up visit:  6 Months Post-Operative RYGB Surgery  Medical Nutrition Therapy:  Appt start time: 1400 end time:  1415.  Primary concerns today: Post-operative Bariatric Surgery Nutrition Management. Returns with a 20 lbs weight loss. Food intake has increased, taking in 5 oz of protein per day meal. Can eat a full bowl of small chili. Will have protein shakes every once in a while. Still not feeling hungry. Discussed cutting portion sizes if he is not feeling hungry.   Still using his opposite hand to eat.   Surgery date: 05/26/2014 Surgery type[facility]GB [facility] on 02/28/14 Weight today: 326.5 lbs   Weight change: 20 lbs Total weight loss: 87 lbs Weight loss goal: 250 lbs  TANITA  BODY COMP RESULTS  05/11/14 06/09/14 07/14/14 08/18/14 11/18/14   BMI (kg/m^2) 61.1 55.2 53.6 51.2 48.2   Fat Mass (lbs) 243 216.0 164.0 141.5 123.0   Fat Free Mass (lbs) 170.5 157.5 199.0 205.0 203.5   Total Body Water (lbs) 125 115.5 145.5 150.0 149.0    Preferred Learning Style:   No preference indicated   Learning Readiness:   Ready  24-hr recall: B (AM): 1-1.5 patties [place] sausage or bacon with 2 eggs (19-21 g) Snk (AM): 1 oz cheese and 8 oz of 1% milk (14 g) L (PM):5 oz cold cuts with cheese and salad (35 g) Snk (PM):EAS Protein shake or 1 oz of cheese (6-17 g)  D (PM): 5 oz of meat with pintos, baked beans, or salad (35 g) Snk (PM): none  Fluid intake: 64 oz per day mostly water and a protein shake, Powerade zero, unsweet tea Estimated total protein intake: 109 g  Medications: see list Supplementation: taking   Using straws: No Drinking while eating: No - trying to wait 30 minutes to drink Hair loss: yes  Carbonated beverages: No N/V/D/C: Ttaking miralax every 2-3 days for constipation Dumping syndrome: No  Recent physical activity:  3 x week at [employer] for 20-40 minutes treadmill, elliptical and some weights and 3 x week walks around parking lot at church  or at hospital  Progress Towards Goal(s):  In progress.    Nutritional Diagnosis:  [place]-3.3 Overweight/obesity related to past poor dietary habits and physical inactivity as evidenced by patient w/ recent RYGB surgery following dietary guidelines for continued weight loss.    Intervention:  Nutrition education/diet advancement. Goals:  Follow Phase 3B: High Protein + Non-Starchy Vegetables  Eat 3-6 small meals/snacks, every 3-5 hrs  Increase lean protein foods to meet 80g goal  Aim for 4 oz instead of 5 oz at lunch at dinner.  Have either cheese or milk for snack, skip snacks if you are not hungry  Have some carbs (fruit or bread) after eating protein and non starchy vegetables only if still hungry  Increase fluid intake to 64oz +  Avoid drinking 15 minutes before, during and 30 minutes after eating   Aim for >30 min of physical activity daily  Teaching Method Utilized:  Visual Auditory Hands on  Barriers to learning/adherence to lifestyle change: none  Demonstrated degree of understanding via:  Teach Back   Monitoring/Evaluation:  Dietary intake, exercise, and body weight. Follow up in 3 months for 9 month post-op visit.

## 2014-11-19 MED ORDER — ALLOPURINOL 300 MG PO TABS: 300.0000 mg | ORAL_TABLET | Freq: Every morning | ORAL | Status: DC

## 2014-11-30 ENCOUNTER — Other Ambulatory Visit: Payer: Self-pay | Admitting: Family Medicine

## 2014-12-09 ENCOUNTER — Ambulatory Visit (INDEPENDENT_AMBULATORY_CARE_PROVIDER_SITE_OTHER): Payer: No Typology Code available for payment source | Admitting: Family Medicine

## 2014-12-09 ENCOUNTER — Encounter: Payer: Self-pay | Admitting: Family Medicine

## 2014-12-09 VITALS — BP 134/77 | HR 75 | Temp 98.1°F | Ht 69.0 in | Wt 324.8 lb

## 2014-12-09 DIAGNOSIS — M1000 Idiopathic gout, unspecified site: Secondary | ICD-10-CM | POA: Diagnosis not present

## 2014-12-09 DIAGNOSIS — M25562 Pain in left knee: Secondary | ICD-10-CM

## 2014-12-09 DIAGNOSIS — R000 Tachycardia, unspecified: Secondary | ICD-10-CM

## 2014-12-09 LAB — COMPREHENSIVE METABOLIC PANEL
ALT: 30 U/L (ref 0–53)
AST: 16 U/L (ref 0–37)
Albumin: 4.3 g/dL (ref 3.5–5.2)
Alkaline Phosphatase: 97 U/L (ref 39–117)
BUN: 14 mg/dL (ref 6–23)
CALCIUM: 9.4 mg/dL (ref 8.4–10.5)
CO2: 29 meq/L (ref 19–32)
CREATININE: 0.8 mg/dL (ref 0.50–1.35)
Chloride: 104 mEq/L (ref 96–112)
Glucose, Bld: 68 mg/dL — ABNORMAL LOW (ref 70–99)
Potassium: 4 mEq/L (ref 3.5–5.3)
Sodium: 143 mEq/L (ref 135–145)
Total Bilirubin: 0.8 mg/dL (ref 0.2–1.2)
Total Protein: 6.5 g/dL (ref 6.0–8.3)

## 2014-12-09 LAB — TSH: TSH: 2.001 u[IU]/mL (ref 0.350–4.500)

## 2014-12-09 IMAGING — RF DG UGI W/ GASTROGRAFIN
14 of 24 series · 14 of 24 positions shown · IV contrast (omnipaque)
Comparison: 03/02/2014.

FLUOROSCOPY TIME:  1 min and 33 seconds.

CLINICAL DATA: 43-year-old male status post Roux-en-Y gastric
bypass performed on 05/26/2014.

EXAM:
WATER SOLUBLE UPPER GI SERIES
TECHNIQUE: Single-column upper GI series was performed using water soluble
contrast.
CONTRAST:  40mL OMNIPAQUE IOHEXOL 300 MG/ML  SOLN

[Series 1: run · 1 of 1 slices shown (1 of 14)]
[im 1/1]
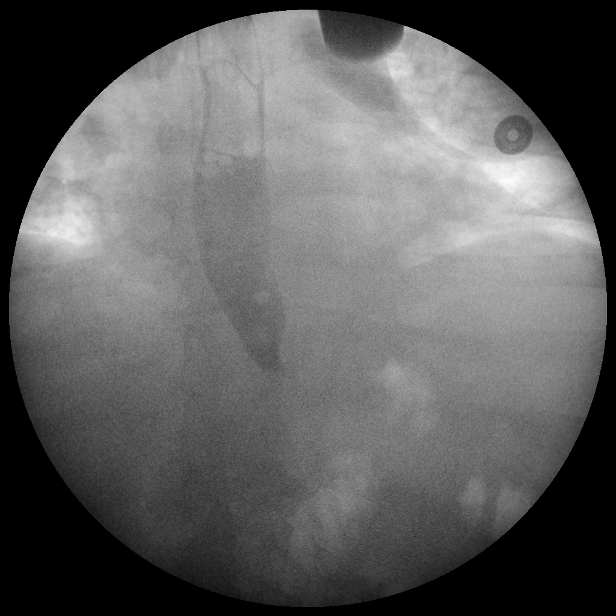

[Series 3: run · 1 of 1 slices shown (2 of 14)]
[im 1/1]
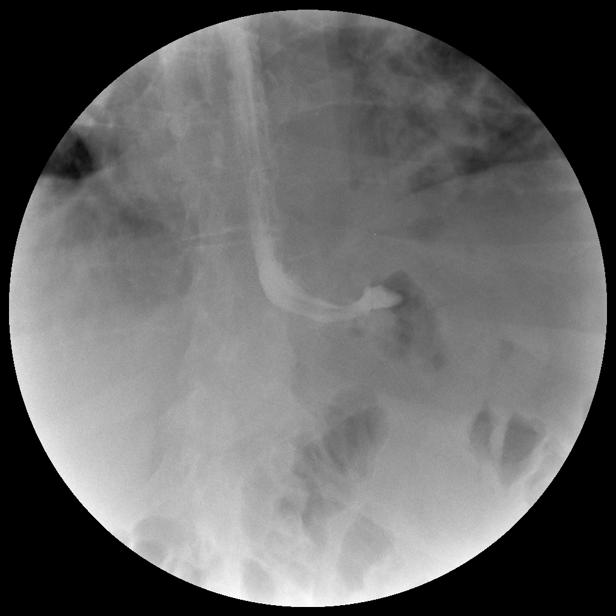

[Series 5: run · 1 of 1 slices shown (3 of 14)]
[im 1/1]
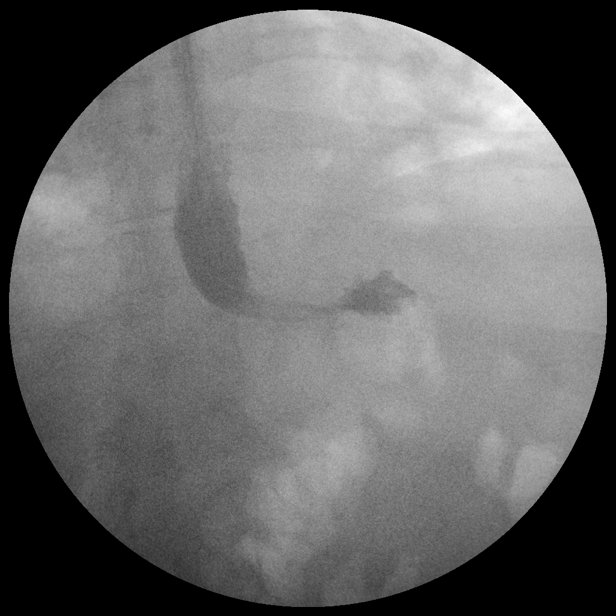

[Series 7: run · 1 of 1 slices shown (4 of 14)]
[im 1/1]
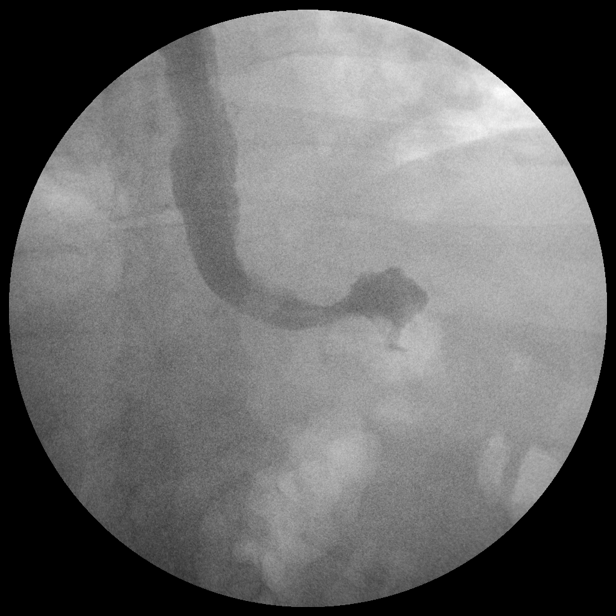

[Series 8: run · 1 of 1 slices shown (5 of 14)]
[im 1/1]
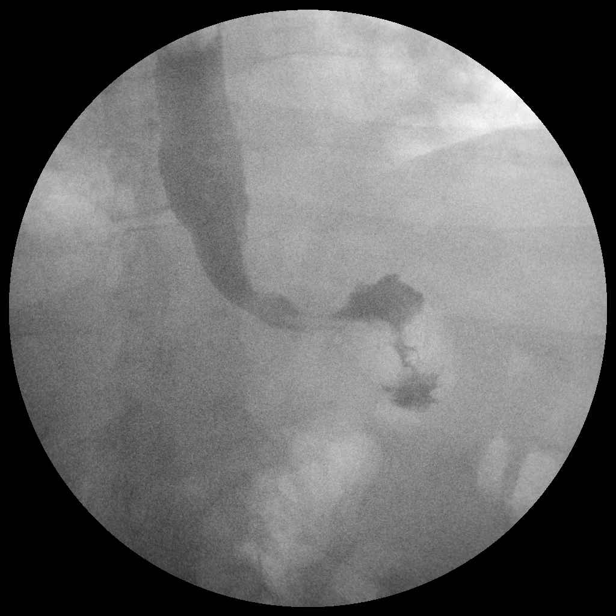

[Series 10: run · 1 of 1 slices shown (6 of 14)]
[im 1/1]
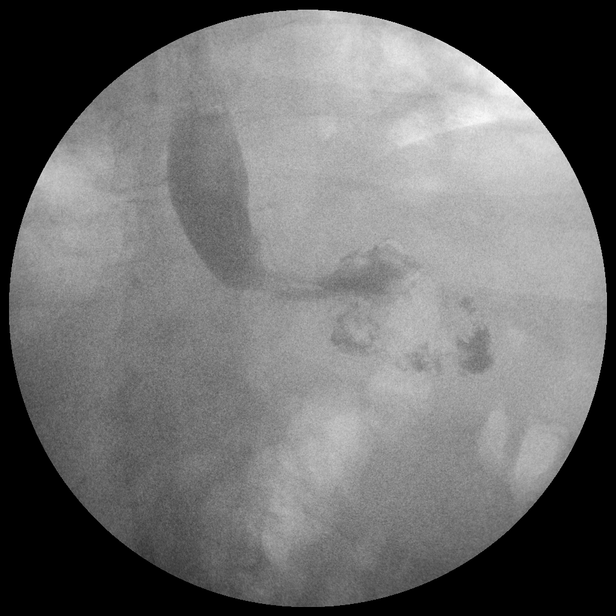

[Series 12: run · 1 of 1 slices shown (7 of 14)]
[im 1/1]
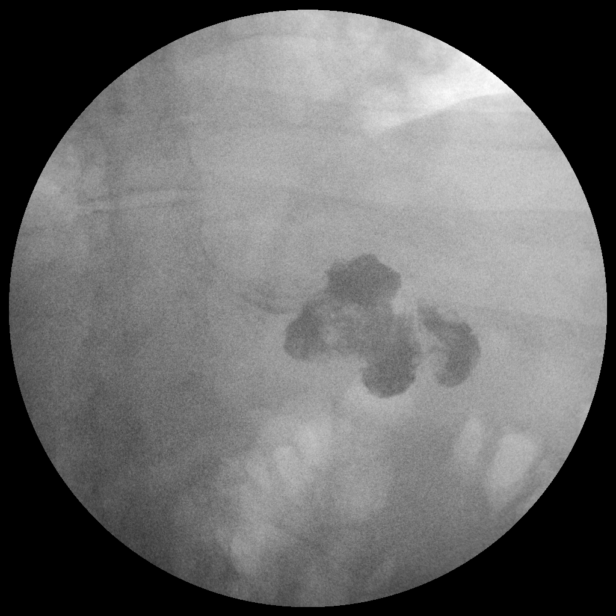

[Series 13: run · 1 of 1 slices shown (8 of 14)]
[im 1/1]
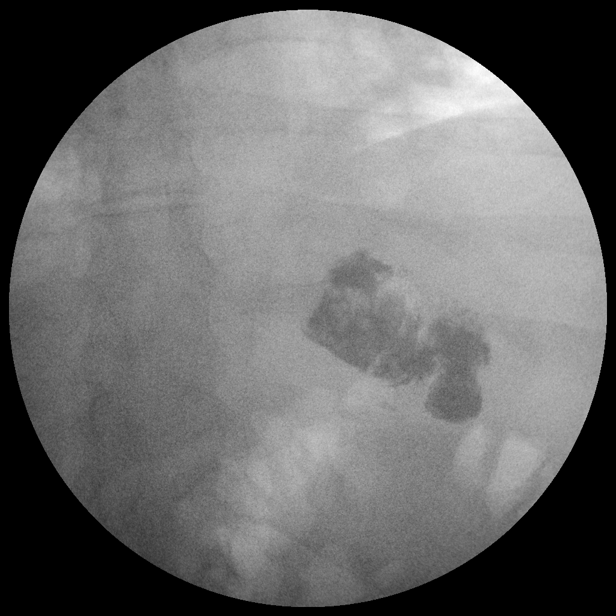

[Series 15: run · 1 of 1 slices shown (9 of 14)]
[im 1/1]
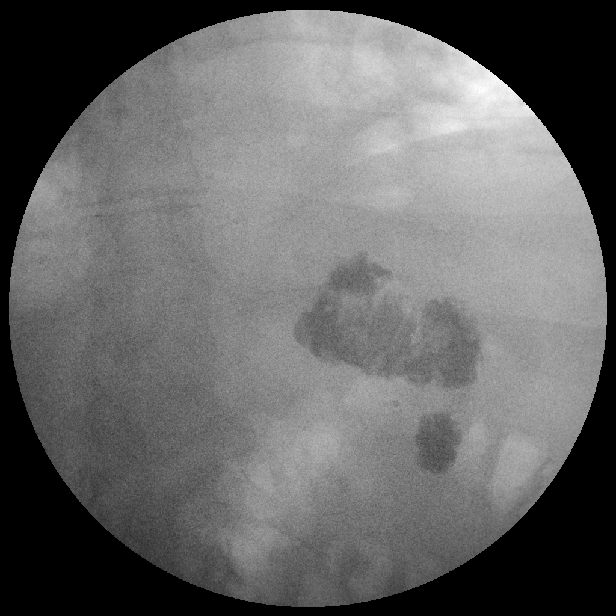

[Series 17: run · 1 of 1 slices shown (10 of 14)]
[im 1/1]
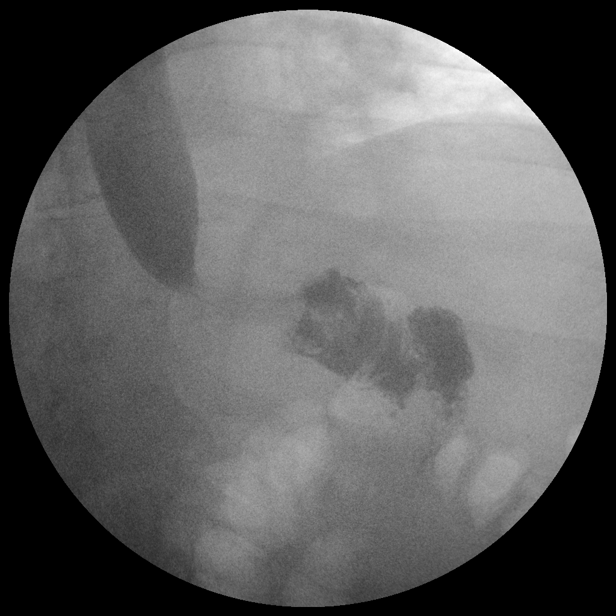

[Series 19: run · 1 of 1 slices shown (11 of 14)]
[im 1/1]
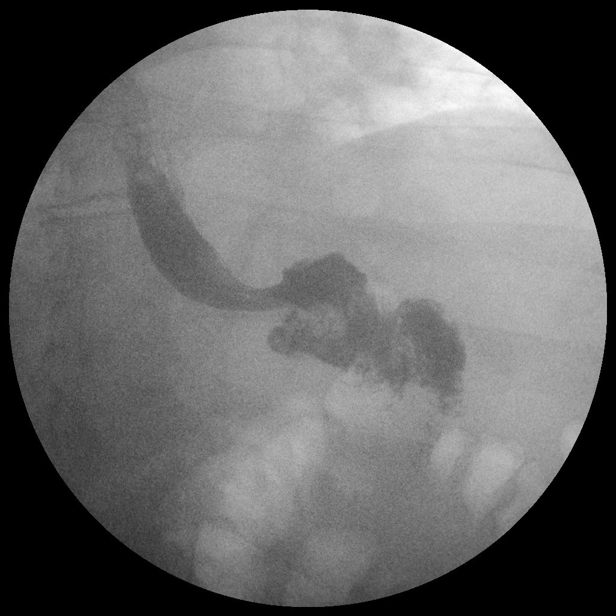

[Series 20: run · 1 of 1 slices shown (12 of 14)]
[im 1/1]
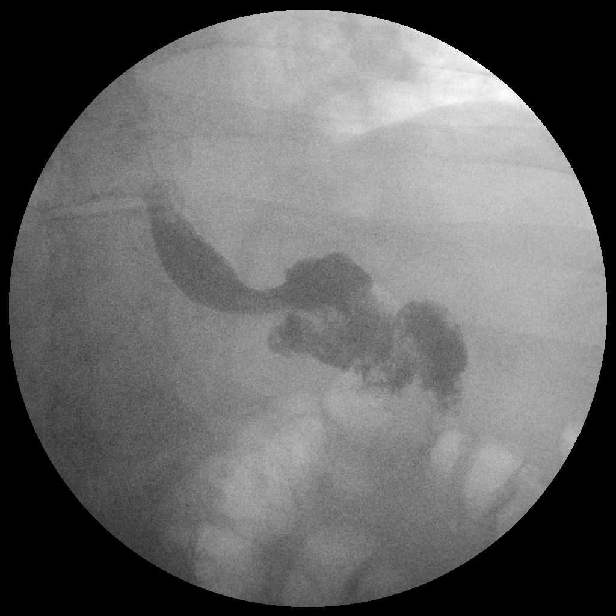

[Series 22: run · 1 of 1 slices shown (13 of 14)]
[im 1/1]
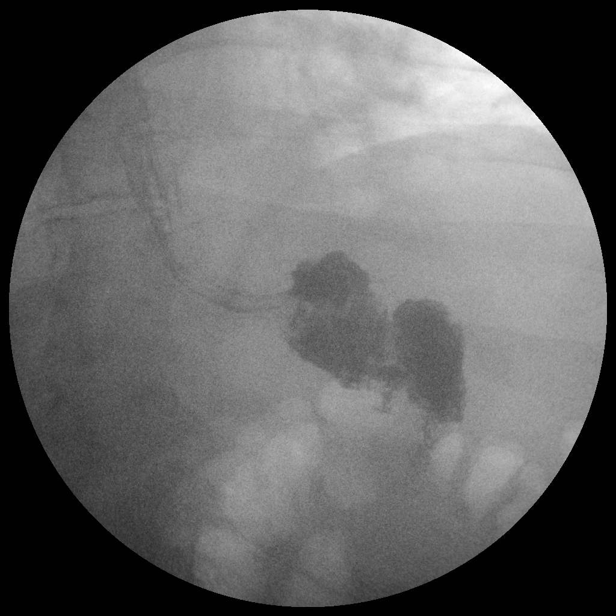

[Series 24: run · 1 of 1 slices shown (14 of 14)]
[im 1/1]
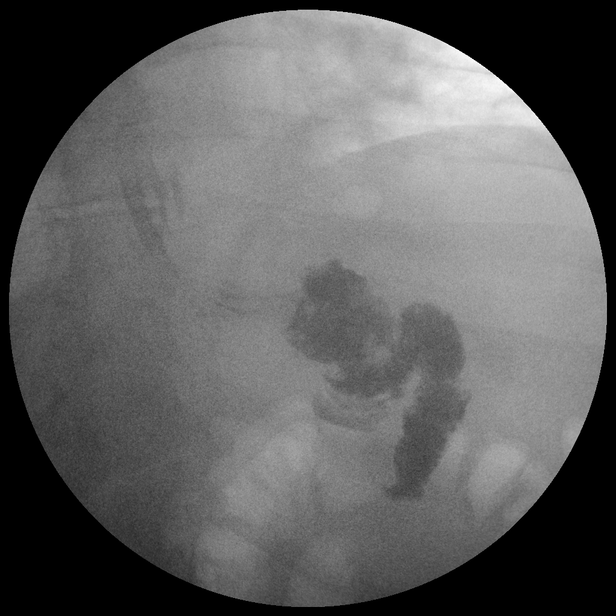

[14 of 24 positions shown; findings below may reference images not displayed]

FINDINGS: Multiple fluoroscopic images were obtained following ingestion of
water-soluble contrast, which demonstrated a normal appearance of
the distal esophagus and gastroesophageal junction. The gastric
remnant was normal in appearance, and there was no evidence of
extravasation from the remnant, or the anastomosis with the Roux
limb. Contrast readily traverses the anastomosis into the small
bowel via the gastrojejunostomy.
IMPRESSION: 1. Expected postoperative appearance following Roux-en-Y gastric
bypass, without acute complicating features, as above.

## 2014-12-09 MED ORDER — CARVEDILOL 3.125 MG PO TABS: 3.1250 mg | ORAL_TABLET | Freq: Two times a day (BID) | ORAL | Status: DC

## 2014-12-09 MED ORDER — ALLOPURINOL 300 MG PO TABS: 300.0000 mg | ORAL_TABLET | Freq: Every morning | ORAL | Status: DC

## 2014-12-09 NOTE — Progress Notes (Signed)
   Subjective:    Patient ID: Justin [name], male    DOB: [birth year], 44 y.o.   MRN: [id number]  HPI   Rapid Heart Rate Has had 4 episodes over the last 3 weeks while resting or walking of feeling of tiredness and lightheadness and rapid heart rate. One episode he used his sons pulse ox which read 126 bpm.  Episodes last a few minutes and he does not have chest pain or shortness of breath.  Never had before weight reduction surgery.  Has been out of Coreg for several weeks.  No change in medications or diet or otc medications.  His exercise tolerance is usual for the last few months since surgery  Left leg pain - episodes of pain around his knee.  Comes and goes over hours.  Knee is not red or hot.  Pain does not radiate.  No skin changes.  No hip pain.  No injury.  Does have heel spurs especailly on left which have been bothering him.  He is seeing podiatrist  Chief Complaint noted Review of Symptoms - see HPI PMH - Smoking status noted.   Vital Signs reviewed   Review of Systems     Objective:   Physical Exam  Alert no acute distress Heart - Regular rate and rhythm.  No murmurs, gallops or rubs.    Lungs:  Normal respiratory effort, chest expands symmetrically. Lungs are clear to auscultation, no crackles or wheezes. Neck:  No deformities, thyromegaly, masses, or tenderness noted.   Supple with full range of motion without pain. Extrem - trace edema bilaterally.  Good range of motion of hips and ankles iwthout pain.  No effusion of knees.  Mild pain in left knee with range of motion.  No discernable laxity or catching in left knee        Assessment & Plan:

## 2014-12-09 NOTE — Assessment & Plan Note (Addendum)
New onset.  Likely SVT although could be due to hypotension with compensatory tachycardia.  Check labs.   Restart coreg at very low dose.  09/17/13: negative cardiac catheterization so very low risk for malignant arrythmia.  Will check electrolytes due to recent weight reduction surgery

## 2014-12-09 NOTE — Patient Instructions (Signed)
Good to see you today!  Thanks for coming in.  Take a lower dose of Coreg twice daily  If the rapid heart rate lasts more than 10 min go to the ER  Call for an xray if the knee is not improving  I will call you if your tests are not good.  Otherwise I will send you a letter.  If you do not hear from me with in 2 weeks please call our office.     Come back for a blood pressure check in 3-6 months

## 2014-12-09 NOTE — Assessment & Plan Note (Signed)
Most consistent with DJD vs overuse.  Might be exacerbated by troulbe with pain in heel and gait changes.  No signs of dvt or infection.  If persists will get xray

## 2014-12-09 NOTE — Assessment & Plan Note (Addendum)
According to patient was started on allopurinol by outside doctor years ago due to family history of gout.  Has never had a flare.  Also taking to prevent kidney stones which he had frequently when younger.  His ua has been 7 in past.  In future may have him stop and check Uric acid

## 2014-12-10 ENCOUNTER — Encounter: Payer: Self-pay | Admitting: Family Medicine

## 2014-12-28 ENCOUNTER — Telehealth: Payer: Self-pay | Admitting: *Deleted

## 2014-12-28 MED ORDER — OXYCODONE-ACETAMINOPHEN 10-325 MG PO TABS: 1.0000 | ORAL_TABLET | ORAL | Status: DC | PRN

## 2014-12-28 NOTE — Telephone Encounter (Signed)
Pt's wife, states she'd like to get an appt sooner than 01/14/2015, but would also like a refill of his pain medication.  Dr. Charlsie Merlesegal ordered refill as previously Percocet.  I informed pt's wife and they [place] pick [place].

## 2015-01-14 ENCOUNTER — Ambulatory Visit (INDEPENDENT_AMBULATORY_CARE_PROVIDER_SITE_OTHER): Payer: No Typology Code available for payment source | Admitting: Podiatry

## 2015-01-14 ENCOUNTER — Encounter: Payer: Self-pay | Admitting: Podiatry

## 2015-01-14 ENCOUNTER — Ambulatory Visit (INDEPENDENT_AMBULATORY_CARE_PROVIDER_SITE_OTHER): Payer: No Typology Code available for payment source

## 2015-01-14 VITALS — BP 131/76 | HR 74 | Resp 15 | Ht 69.0 in | Wt 311.0 lb

## 2015-01-14 DIAGNOSIS — M7661 Achilles tendinitis, right leg: Secondary | ICD-10-CM | POA: Diagnosis not present

## 2015-01-14 DIAGNOSIS — M7662 Achilles tendinitis, left leg: Secondary | ICD-10-CM | POA: Diagnosis not present

## 2015-01-14 DIAGNOSIS — M79671 Pain in right foot: Secondary | ICD-10-CM | POA: Diagnosis not present

## 2015-01-14 DIAGNOSIS — M79672 Pain in left foot: Secondary | ICD-10-CM | POA: Diagnosis not present

## 2015-01-16 NOTE — Progress Notes (Signed)
He presents today after having lost over 100 pounds for a chief complaint of insertional Achilles tendinitis bilateral.  Objective: Vital signs are stable he is alert and oriented 3. As palpable pulses bilateral. No calf pain. Tenderness on palpation of the Achilles of the insert on the posterior aspect of the calcaneus bilaterally. No erythema edema saline as drainage or odor.  Assessment: Chronic Achilles tendinitis insertional bilateral.  Plan: Injected subcutaneously today into the bursa with dexamethasone local and aesthetic never injecting directly into the tendon. [name] as needed. Encouraged him to continue to lose weight. Should his heels continue to bother him after he is at a decent weight we will consider surgical intervention. But he has to be able to ambulate with a knee scooter or crutches at that time.

## 2015-01-20 ENCOUNTER — Emergency Department (HOSPITAL_COMMUNITY)
Admission: EM | Admit: 2015-01-20 | Discharge: 2015-01-20 | Disposition: A | Discharge status: Home / Self Care | Payer: No Typology Code available for payment source | Attending: Emergency Medicine | Admitting: Emergency Medicine

## 2015-01-20 ENCOUNTER — Emergency Department (HOSPITAL_COMMUNITY): Payer: No Typology Code available for payment source

## 2015-01-20 ENCOUNTER — Encounter (HOSPITAL_COMMUNITY): Payer: Self-pay | Admitting: Cardiology

## 2015-01-20 DIAGNOSIS — R079 Chest pain, unspecified: Secondary | ICD-10-CM | POA: Diagnosis not present

## 2015-01-20 DIAGNOSIS — Z8659 Personal history of other mental and behavioral disorders: Secondary | ICD-10-CM | POA: Diagnosis not present

## 2015-01-20 DIAGNOSIS — Z87448 Personal history of other diseases of urinary system: Secondary | ICD-10-CM | POA: Diagnosis not present

## 2015-01-20 DIAGNOSIS — R0602 Shortness of breath: Secondary | ICD-10-CM | POA: Diagnosis not present

## 2015-01-20 DIAGNOSIS — E669 Obesity, unspecified: Secondary | ICD-10-CM | POA: Insufficient documentation

## 2015-01-20 DIAGNOSIS — I10 Essential (primary) hypertension: Secondary | ICD-10-CM | POA: Diagnosis not present

## 2015-01-20 DIAGNOSIS — G4733 Obstructive sleep apnea (adult) (pediatric): Secondary | ICD-10-CM | POA: Insufficient documentation

## 2015-01-20 DIAGNOSIS — Z79899 Other long term (current) drug therapy: Secondary | ICD-10-CM | POA: Insufficient documentation

## 2015-01-20 DIAGNOSIS — Z9981 Dependence on supplemental oxygen: Secondary | ICD-10-CM | POA: Insufficient documentation

## 2015-01-20 DIAGNOSIS — Z87442 Personal history of urinary calculi: Secondary | ICD-10-CM | POA: Insufficient documentation

## 2015-01-20 LAB — CBC
HEMATOCRIT: 43.2 % (ref 39.0–52.0)
HEMOGLOBIN: 14.8 g/dL (ref 13.0–17.0)
MCH: 30.6 pg (ref 26.0–34.0)
MCHC: 34.3 g/dL (ref 30.0–36.0)
MCV: 89.3 fL (ref 78.0–100.0)
PLATELETS: 275 10*3/uL (ref 150–400)
RBC: 4.84 MIL/uL (ref 4.22–5.81)
RDW: 14.3 % (ref 11.5–15.5)
WBC: 7.7 10*3/uL (ref 4.0–10.5)

## 2015-01-20 LAB — BASIC METABOLIC PANEL
ANION GAP: 8 (ref 5–15)
BUN: 11 mg/dL (ref 6–20)
CHLORIDE: 105 mmol/L (ref 101–111)
CO2: 26 mmol/L (ref 22–32)
CREATININE: 0.79 mg/dL (ref 0.61–1.24)
Calcium: 9.7 mg/dL (ref 8.9–10.3)
GFR calc Af Amer: >60 mL/min (ref >60)
Glucose, Bld: 107 mg/dL — ABNORMAL HIGH (ref 65–99)
POTASSIUM: 3.9 mmol/L (ref 3.5–5.1)
SODIUM: 139 mmol/L (ref 135–145)

## 2015-01-20 LAB — I-STAT TROPONIN, ED: Troponin i, poc: 0 ng/mL (ref 0.00–0.08)

## 2015-01-20 NOTE — ED Notes (Signed)
Pt reports that left sided chest pain that started this morning. Reports some SOB with the pain. No n/v or diaphoresis. Pt reports he has been under increased stress recently.

## 2015-01-20 NOTE — ED Notes (Signed)
Pt returned from x-ray. Monitored by pulse ox, bp cuff, and 5-lead. 

## 2015-01-20 NOTE — ED Provider Notes (Signed)
CSN: [id number]     Arrival date & time 01/20/15  7425 History   First MD Initiated Contact with Patient 01/20/15 [phone]     Chief Complaint  Patient presents with  . Chest Pain     (Consider location/radiation/quality/duration/timing/severity/associated sxs/prior Treatment) HPI Comments: 44 year old male with morbid obesity, presents emergency department, chief complaint of chest pain. He is status post gastric bypass. The patient states that yesterday he had a very stressful evening and did not sleep well. When he awoke this morning he noticed left-sided chest pain that radiated to his left shoulder with associated symptoms of pressure and mild shortness of breath. He describes the pain as aching, moderate and lasted for approximately 20 minutes. He has had no medications and no recurrence of symptoms since that time and is currently pain-free. The patient does have a past history of hypertension. He states that since his gastric bypass. He has been able to reduce his blood pressure medications and no longer has to take his cholesterol medications, as well. Patient denies a history of smoking, diabetes. He has a past family history of known coronary artery disease on both his mother and father as well as his grandparents. He denies a history of MI prior to the age of 19, and any family member. Patient denies any other symptoms such as unilateral leg swelling, exertional dyspnea, fevers, chills, diaphoresis, abdominal pain, nausea or vomiting. The patient was admitted for chest pain, rule out back in 2014. At that time he had a negative heart catheterization as well as ejection fraction of 50-60%  Patient is a 44 y.o. male presenting with chest pain. The history is provided by the patient. No language interpreter was used.  Chest Pain Pain location:  L chest Pain quality: aching, pressure and radiating   Pain radiates to:  Does not radiate Pain radiates to the back: no   Pain severity:   Moderate Onset quality:  Sudden (woke up with, not from pain) Duration:  20 minutes Timing:  Constant Progression:  Resolved Chronicity:  Recurrent Context: at rest   Context: not breathing, no drug use, not eating, no intercourse, not lifting, no movement, not raising an arm, no stress and no trauma   Relieved by:  None tried Worsened by:  Nothing tried Ineffective treatments:  None tried Associated symptoms: shortness of breath   Associated symptoms: no abdominal pain, no AICD problem, no altered mental status, no anorexia, no anxiety, no back pain, no claudication, no cough, no diaphoresis, no dizziness, no dysphagia, no fatigue, no fever, no headache, no heartburn, no lower extremity edema, no nausea, no near-syncope, no numbness, no orthopnea, no palpitations, no PND, no syncope, not vomiting and no weakness     Past Medical History  Diagnosis Date  . Obese   . Hypertension   . OSA on CPAP     uses cpap, pt does not know settings  . Nephrolithiasis   . Kidney stones age 89 to 63  . Bipolar disorder     no current meds, diagnosed 2 months ago   Past Surgical History  Procedure Laterality Date  . Cardiac catheterization  08/2013    normal arteries/function  . Tooth extraction    . Gastric roux-en-y N/A 05/26/2014    Procedure: LAPAROSCOPIC ROUX-EN-Y GASTRIC BYPASS WITH UPPER ENDOSCOPY;  Surgeon: Atilano Ina, MD;  Location: [facility];  Service: General;  Laterality: N/A;  . Upper gi endoscopy  05/26/2014    Procedure: UPPER GI ENDOSCOPY;  Surgeon: Minerva Areola  Elson Clan, MD;  Location: [facility];  Service: General;;  . Left heart catheterization with coronary angiogram N/A 09/17/2013    Procedure: LEFT HEART CATHETERIZATION WITH CORONARY ANGIOGRAM;  Surgeon: Peter M Swaziland, MD;  Location: [facility];  Service: Cardiovascular;  Laterality: N/A;   Family History  Problem Relation Age of Onset  . Coronary artery disease Mother 50    MI  . Diabetes Mother   . Stroke Mother     ~2012-Deceased    History  Substance Use Topics  . Smoking status: Never Smoker   . Smokeless tobacco: Never Used  . Alcohol Use: No    Review of Systems  Constitutional: Negative for fever, diaphoresis and fatigue.  HENT: Negative for trouble swallowing.   Respiratory: Positive for shortness of breath. Negative for cough.   Cardiovascular: Positive for chest pain. Negative for palpitations, orthopnea, claudication, syncope, PND and near-syncope.  Gastrointestinal: Negative for heartburn, nausea, vomiting, abdominal pain and anorexia.  Musculoskeletal: Negative for back pain.  Neurological: Negative for dizziness, weakness, numbness and headaches.  Ten systems reviewed and are negative for acute change, except as noted in the HPI.      Allergies  Lamictal; Tegretol; and Sulfonamide derivatives  Home Medications   Prior to Admission medications   Medication Sig Start Date End Date Taking? Authorizing Provider  allopurinol (ZYLOPRIM) 300 MG tablet Take 1 tablet (300 mg total) by mouth every morning. 12/09/14   Carney Living, MD  Calcium Citrate-Vitamin D 500-500 MG-UNIT CHEW Chew by mouth 3 (three) times daily.    Historical Provider, MD  carvedilol (COREG) 3.125 MG tablet Take 1 tablet (3.125 mg total) by mouth 2 (two) times daily with a meal. 12/09/14   Carney Living, MD  Cholecalciferol (VITAMIN D PO) Take 1 tablet by mouth every morning.     Historical Provider, MD  Multiple Vitamin (MULTIVITAMIN WITH MINERALS) TABS Take 2 tablets by mouth every morning.     Historical Provider, MD  oxyCODONE-acetaminophen (PERCOCET) 10-325 MG per tablet Take 1 tablet by mouth every 4 (four) hours as needed for pain. 12/28/14   Lenn Sink, DPM  pantoprazole (PROTONIX) 40 MG tablet Take 1 tablet (40 mg total) by mouth daily. 05/28/14   Gaynelle Adu, MD  Zinc 50 MG TABS Take 1 tablet by mouth every morning.     Historical Provider, MD   BP 151/79 mmHg  Pulse 83  Temp(Src) 98.4 F (36.9 C)  (Oral)  Resp 18  Wt 311 lb (141.069 kg)  SpO2 95% Physical Exam Physical Exam  Nursing note and vitals reviewed. Constitutional: He appears well-developed and well-nourished. No distress.  HENT:  Head: Normocephalic and atraumatic.  Eyes: Conjunctivae normal are normal. No scleral icterus.  Neck: Normal range of motion. Neck supple.  Cardiovascular: Normal rate, regular rhythm and normal heart sounds.   Pulmonary/Chest: Effort normal and breath sounds normal. No respiratory distress.  Abdominal: Soft. There is no tenderness.  Musculoskeletal: He exhibits no edema.  Neurological: He is alert.  Skin: Skin is warm and dry. He is not diaphoretic.  Psychiatric: His behavior is normal. Anxious    ED Course  Procedures (including critical care time) Labs Review Labs Reviewed  CBC  BASIC METABOLIC PANEL    Imaging Review No results found.   EKG Interpretation   Date/Time:  Wednesday January 20 2015 09:30:30 EDT Ventricular Rate:  77 PR Interval:  191 QRS Duration: 74 QT Interval:  370 QTC Calculation: 419 R Axis:  39 Text Interpretation:  Sinus rhythm Abnormal R-wave progression, early  transition Sinus rhythm Artifact Abnormal ekg Confirmed by Gerhard Munch  MD ([id number]) on 01/20/2015 9:40:12 AM      MDM   Final diagnoses:  Chest pain with low risk for cardiac etiology   10:10 AM BP 151/79 mmHg  Pulse 83  Temp(Src) 98.4 F (36.9 C) (Oral)  Resp 18  Wt 311 lb (141.069 kg)  SpO2 95% Patient here with a complaint of chest pain which occurred at rest, lasting about 20 minutes, history of negative heart catheterization. He has some mild hypertension without any other abnormal vital signs. Workup is currently pending. His EKG is unchanged from previous without any signs of acute ischemia.  11:10 AM\ BP 134/71 mmHg  Pulse 72  Temp(Src) 98.4 F (36.9 C) (Oral)  Resp 19  Wt 311 lb (141.069 kg)  SpO2 96% Patient here with complaint of chest pain lasting 20 minutes  this morning. Heart score of 1. Initial troponin negative, no EKG changes, chest x-ray without abnormality except for cardiomegaly, which is old. Given the patient's previous negative coronary artery catheterization, I feel that his risk factors for MI or acute coronary syndrome are very minimal. He has had no return of his symptoms since that time. I doubt other emergent etiologies such as pulmonary embolus, dissecting thoracic aortic aneurysm, or other etiology as the patient has had no return of symptoms. He appears safe for discharge at this time may follow up with cardiology for a cardiac stress test and his primary care physician. I have discussed return precautions with the patient who appears to understand and agrees with plan of care.  Arthor Captain, PA-C 01/20/15 1111  Gerhard Munch, MD 01/20/15 [phone]

## 2015-01-20 NOTE — ED Notes (Signed)
Patient transported to X-ray 

## 2015-01-20 NOTE — Discharge Instructions (Signed)
You were seen today for your complaint of chest pain. Your EKG was normal and her troponin, which is a marker of protein breakdown of the hard tissue pain you have a heart attack was negative. Your risk factors for having a heart attack or other chest pain, cause by your heart or minimal, however, you should follow up with a cardiologist for a cardiac stress test. Given you a previous cardiac catheterization which was negative for any coronary artery disease. The likelihood of heart attack for you is small. Please read the information below. Reasons to return to the emergency department as soon as possible for chest pain. Follow-up with your primary care physician as well.  Your caregiver has diagnosed you as having chest pain that is not specific for one problem, but does not require admission.  You are at low risk for an acute heart condition or other serious illness. Chest pain comes from many different causes.  SEEK IMMEDIATE MEDICAL ATTENTION IF: You have severe chest pain, especially if the pain is crushing or pressure-like and spreads to the arms, back, neck, or jaw, or if you have sweating, nausea (feeling sick to your stomach), or shortness of breath. THIS IS AN EMERGENCY. Don't wait to see if the pain will go away. Get medical help at once. Call 911 or 0 (operator). DO NOT drive yourself to the hospital.  Your chest pain gets worse and does not go away with rest.  You have an attack of chest pain lasting longer than usual, despite rest and treatment with the medications your caregiver has prescribed.  You wake from sleep with chest pain or shortness of breath.  You feel dizzy or faint.  You have chest pain not typical of your usual pain for which you originally saw your caregiver.  Chest Pain (Nonspecific) It is often hard to give a specific diagnosis for the cause of chest pain. There is always a chance that your pain could be related to something serious, such as a heart attack or a blood  clot in the lungs. You need to follow up with your health care provider for further evaluation. CAUSES   Heartburn.  Pneumonia or bronchitis.  Anxiety or stress.  Inflammation around your heart (pericarditis) or lung (pleuritis or pleurisy).  A blood clot in the lung.  A collapsed lung (pneumothorax). It can develop suddenly on its own (spontaneous pneumothorax) or from trauma to the chest.  Shingles infection (herpes zoster virus). The chest wall is composed of bones, muscles, and cartilage. Any of these can be the source of the pain.  The bones can be bruised by injury.  The muscles or cartilage can be strained by coughing or overwork.  The cartilage can be affected by inflammation and become sore (costochondritis). DIAGNOSIS  Lab tests or other studies may be needed to find the cause of your pain. Your health care provider may have you take a test called an ambulatory electrocardiogram (ECG). An ECG records your heartbeat patterns over a 24-hour period. You may also have other tests, such as:  Transthoracic echocardiogram (TTE). During echocardiography, sound waves are used to evaluate how blood flows through your heart.  Transesophageal echocardiogram (TEE).  Cardiac monitoring. This allows your health care provider to monitor your heart rate and rhythm in real time.  Holter monitor. This is a portable device that records your heartbeat and can help diagnose heart arrhythmias. It allows your health care provider to track your heart activity for several days, if needed.  Stress tests by exercise or by giving medicine that makes the heart beat faster. TREATMENT   Treatment depends on what may be causing your chest pain. Treatment may include:  Acid blockers for heartburn.  Anti-inflammatory medicine.  Pain medicine for inflammatory conditions.  Antibiotics if an infection is present.  You may be advised to change lifestyle habits. This includes stopping smoking and  avoiding alcohol, caffeine, and chocolate.  You may be advised to keep your head raised (elevated) when sleeping. This reduces the chance of acid going backward from your stomach into your esophagus. Most of the time, nonspecific chest pain will improve within 2-3 days with rest and mild pain medicine.  HOME CARE INSTRUCTIONS   If antibiotics were prescribed, take them as directed. Finish them even if you start to feel better.  For the next few days, avoid physical activities that bring on chest pain. Continue physical activities as directed.  Do not use any tobacco products, including cigarettes, chewing tobacco, or electronic cigarettes.  Avoid drinking alcohol.  Only take medicine as directed by your health care provider.  Follow your health care provider's suggestions for further testing if your chest pain does not go away.  Keep any follow-up appointments you made. If you do not go to an appointment, you could develop lasting (chronic) problems with pain. If there is any problem keeping an appointment, call to reschedule. SEEK MEDICAL CARE IF:   Your chest pain does not go away, even after treatment.  You have a rash with blisters on your chest.  You have a fever. SEEK IMMEDIATE MEDICAL CARE IF:   You have increased chest pain or pain that spreads to your arm, neck, jaw, back, or abdomen.  You have shortness of breath.  You have an increasing cough, or you cough up blood.  You have severe back or abdominal pain.  You feel nauseous or vomit.  You have severe weakness.  You faint.  You have chills. This is an emergency. Do not wait to see if the pain will go away. Get medical help at once. Call your local emergency services (911 in [place] Do not drive yourself to the hospital. MAKE SURE YOU:   Understand these instructions.  Will watch your condition.  Will get help right away if you are not doing well or get worse. Document Released: 03/15/2005 Document Revised:  06/10/2013 Document Reviewed: 01/09/2008 [facility] Patient Information 2015 [place], [place]. This information is not intended to replace advice given to you by your health care provider. Make sure you discuss any questions you have with your health care provider.

## 2015-01-20 NOTE — ED Notes (Signed)
Provider at the bedside.  

## 2015-02-17 ENCOUNTER — Ambulatory Visit: Payer: No Typology Code available for payment source | Admitting: Family Medicine

## 2015-02-18 ENCOUNTER — Ambulatory Visit: Payer: No Typology Code available for payment source | Admitting: Dietician

## 2015-02-23 ENCOUNTER — Encounter: Payer: No Typology Code available for payment source | Attending: General Surgery | Admitting: Dietician

## 2015-02-23 ENCOUNTER — Encounter: Payer: Self-pay | Admitting: Dietician

## 2015-02-23 VITALS — Ht 69.0 in | Wt 319.0 lb

## 2015-02-23 DIAGNOSIS — Z6841 Body Mass Index (BMI) 40.0 and over, adult: Secondary | ICD-10-CM | POA: Diagnosis not present

## 2015-02-23 DIAGNOSIS — Z713 Dietary counseling and surveillance: Secondary | ICD-10-CM | POA: Insufficient documentation

## 2015-02-23 NOTE — Progress Notes (Signed)
Follow-up visit: 9 Months Post-Operative RYGB Surgery  Medical Nutrition Therapy:  Appt start time: 855 end time: 920  .  Primary concerns today: Post-operative Bariatric Surgery Nutrition Management. Returns with a 7.5 lbs weight loss. Weight has been up and down in past 3 months. Has been doing some travelling and not able to eat what he is supposed to sometimes. Schedule is more normal now. Hunger is starting to come, though still having small small. Back to using primary hand while hand.   If not on his "routine" will have 2 meals per day and snacks and having sweets, carbs, and fried foods. Having carbs regularly on his routine.   Not having salads lately.  Planning to get back to the gym.  Surgery date: 05/26/2014 Surgery type: RYGB Start weight at [facility]: 412 on 02/28/14 Weight today: 319.0 lbs  Weight change: 7.5 lbs Total weight loss: 94.5 lbs Weight loss goal: 250 lbs  TANITA  BODY COMP RESULTS  05/11/14 06/09/14 07/14/14 08/18/14 11/18/14 02/23/15   BMI (kg/m^2)47.1 61.1 55.2 53.6 51.2 48.2 47.1   Fat Mass (lbs) 243 216.0 164.0 141.5 123.0 142.5   Fat Free Mass (lbs) 170.5 157.5 199.0 205.0 203.5 176.5   Total Body Water (lbs) 125 115.5 145.5 150.0 149.0 129.0    Preferred Learning Style:   No preference indicated   Learning Readiness:   Ready  24-hr recall: B (AM): 2 pieces of bacon with 1 eggs, 1 tablespoon corned beef hash and 1/2 piece of toast with mayo (19-21 g) Snk (AM): slim jim and 8 oz of 1% milk (21 g) L (PM):5 oz cold cuts with cheese (13 g) Snk (PM):EAS Protein shake (17 g)  D (PM): 4 oz of hamburger with bun, 1 hot dog and baked beans (35 g) Snk (PM): mini bag of popcorn  Fluid intake: 64 oz per day mostly water and a protein shake, Powerade zero, unsweet tea, decaf coffee Estimated total protein intake: 109 g  Medications: see list Supplementation: taking   Using straws: No Drinking while eating: a little bit  Hair loss: yes  Carbonated  beverages: No N/V/D/C: Taking miralax every 2-3 days for constipation Dumping syndrome: No  Recent physical activity:  5 x week walks around parking lot at church or at hospital for 5-10 minutes or at 1 x week home walk for 20 minutes  Progress Towards Goal(s):  In progress.    Nutritional Diagnosis:  [place]-3.3 Overweight/obesity related to past poor dietary habits and physical inactivity as evidenced by patient w/ recent RYGB surgery following dietary guidelines for continued weight loss.    Intervention:  Nutrition education/diet reinforcement Goals:  Follow Phase 3B: High Protein + Non-Starchy Vegetables  Eat 3-6 small meals/snacks, every 3-5 hrs  Increase lean protein foods to meet 80g goal  Aim for 4 oz instead of 5 oz at lunch at dinner.  Cut back on carbs - potatoes, bread, popcorn, sweets   Increase vegetable intake at lunch and dinner   Skip snacks if you are not hungry  Increase fluid intake to 64oz +  Avoid drinking 15 minutes before, during and 30 minutes after eating   Aim for >30 min of physical activity daily  Plan to go the gym 3-5 x week  Aim for 700-900 calories in MyFitness Pal or use Barit[employer] app  Teaching Method Utilized:  Visual Auditory Hands on  Barriers to learning/adherence to lifestyle change: none  Demonstrated degree of understanding via:  Teach Back   Monitoring/Evaluation:  Dietary  intake, exercise, and body weight. Follow up in 3 months for 12 month post-op visit.

## 2015-02-23 NOTE — Patient Instructions (Addendum)
Goals:  Follow Phase 3B: High Protein + Non-Starchy Vegetables  Eat 3-6 small meals/snacks, every 3-5 hrs  Increase lean protein foods to meet 80g goal  Aim for 4 oz instead of 5 oz at lunch at dinner.  Cut back on carbs - potatoes, bread, popcorn, sweets   Increase vegetable intake at lunch and dinner   Skip snacks if you are not hungry  Increase fluid intake to 64oz +  Avoid drinking 15 minutes before, during and 30 minutes after eating   Aim for >30 min of physical activity daily  Plan to go the gym 3-5 x week  Aim for 700-900 calories in MyFitness Pal or use [employer]it[employer] app

## 2015-02-25 ENCOUNTER — Ambulatory Visit
Admission: RE | Admit: 2015-02-25 | Discharge: 2015-02-25 | Disposition: A | Discharge status: Home / Self Care | Payer: No Typology Code available for payment source | Source: Ambulatory Visit | Attending: Family Medicine | Admitting: Family Medicine

## 2015-02-25 ENCOUNTER — Ambulatory Visit (INDEPENDENT_AMBULATORY_CARE_PROVIDER_SITE_OTHER): Payer: No Typology Code available for payment source | Admitting: Family Medicine

## 2015-02-25 ENCOUNTER — Other Ambulatory Visit: Payer: Self-pay | Admitting: Family Medicine

## 2015-02-25 ENCOUNTER — Encounter: Payer: Self-pay | Admitting: Family Medicine

## 2015-02-25 VITALS — BP 115/45 | HR 100 | Temp 98.4°F | Ht 69.0 in | Wt 318.3 lb

## 2015-02-25 DIAGNOSIS — M25562 Pain in left knee: Secondary | ICD-10-CM

## 2015-02-25 MED ORDER — OXYCODONE-ACETAMINOPHEN 5-325 MG PO TABS: 1.0000 | ORAL_TABLET | Freq: Three times a day (TID) | ORAL | Status: DC | PRN

## 2015-02-25 NOTE — Assessment & Plan Note (Signed)
Exam unremarkable except for crepitus with flexion/extension and tenderness of musculature of L medial thigh. Spontaneously improving from yesterday. Suspect some degree of osteoarthritis given body habitus. -obtain xrays: L knee complete, bilateral standing AP -small quantity of percocet given ([place] controlled substance database reviewed, with appropriate findings) -ice, compression, elevation -f/u with PCP in 2 weeks, sooner if not improving

## 2015-02-25 NOTE — Progress Notes (Signed)
  HPI:  Pt presents for a same day appointment to discuss left knee pain.  Pain began yesterday afternoon. Started with pain in L knee, then progressed to L medial thigh. Noticed warm feeling in thigh as well. No preceding injury. Has been walking on treadmill at gym intermittently for weight loss. Had gastric bypass last year so unable to take NSAIDs. Tried tylenol without relief. Reports having prior rx for percocet from his podiatrist for bone spurs, that he takes only as needed but is out of this. Has not had xrays of knees. No locking or giving out. Knee feels better today than it did yesterday.  ROS: See HPI  PMFSH: hx anxiety, gout, HTN sleep apnea, morbid obesity  PHYSICAL EXAM: BP 115/45 mmHg  Pulse 100  Temp(Src) 98.4 F (36.9 C) (Oral)  Ht  (1.753 m)  Wt 318 lb 4.8 oz (144.38 kg)  BMI 46.98 kg/m2 Gen: NAD, pleasant, cooperative Ext: L knee with crepitus on flexion & extension. No visible effusion, although body habitus limits exam. MCL & LCL intact to varus & valgus stressing. No joint line tenderness appreciable. Full strength with hip flexion, knee flexion/extension. No palpable warmth on knee or medial thigh. Some mild TTP of L proximal medial thigh.  ASSESSMENT/PLAN:  Left knee pain Exam unremarkable except for crepitus with flexion/extension and tenderness of musculature of L medial thigh. Spontaneously improving from yesterday. Suspect some degree of osteoarthritis given body habitus. -obtain xrays: L knee complete, bilateral standing AP -small quantity of percocet given ([place] controlled substance database reviewed, with appropriate findings) -ice, compression, elevation -f/u with PCP in 2 weeks, sooner if not improving    FOLLOW UP: F/u in 2 weeks with PCP for L knee pain.  [place] J. Pollie Meyer, MD [facility]

## 2015-02-25 NOTE — Patient Instructions (Signed)
Gave small quantity of percocet for you to use for pain Use ice, compression, elevation Go get xrays of knee Follow up with Dr. Deirdre Priest in 2 weeks, sooner if worsening  Be well, Dr. Pollie Meyer

## 2015-03-01 ENCOUNTER — Other Ambulatory Visit: Payer: Self-pay | Admitting: *Deleted

## 2015-03-01 MED ORDER — CARVEDILOL 3.125 MG PO TABS: 3.1250 mg | ORAL_TABLET | Freq: Two times a day (BID) | ORAL | Status: DC

## 2015-03-04 ENCOUNTER — Other Ambulatory Visit: Payer: Self-pay | Admitting: *Deleted

## 2015-03-04 MED ORDER — ALLOPURINOL 300 MG PO TABS: 300.0000 mg | ORAL_TABLET | Freq: Every morning | ORAL | Status: DC

## 2015-03-05 ENCOUNTER — Encounter: Payer: Self-pay | Admitting: Family Medicine

## 2015-03-11 ENCOUNTER — Ambulatory Visit (INDEPENDENT_AMBULATORY_CARE_PROVIDER_SITE_OTHER): Payer: No Typology Code available for payment source | Admitting: Family Medicine

## 2015-03-11 VITALS — BP 149/90 | HR 60 | Temp 98.2°F | Ht 69.0 in | Wt 317.0 lb

## 2015-03-11 DIAGNOSIS — M25562 Pain in left knee: Secondary | ICD-10-CM

## 2015-03-11 MED ORDER — OXYCODONE-ACETAMINOPHEN 5-325 MG PO TABS: 1.0000 | ORAL_TABLET | Freq: Three times a day (TID) | ORAL | Status: DC | PRN

## 2015-03-11 MED ORDER — METHYLPREDNISOLONE ACETATE 40 MG/ML IJ SUSP
40.0000 mg | Freq: Once | INTRAMUSCULAR | Status: AC
  Administered 2015-03-11: 40 mg via INTRA_ARTICULAR

## 2015-03-11 NOTE — Patient Instructions (Signed)
Steroid injection today in knee Getting MRI Referring to Dr. Luiz Blare Refilled percocet  Be well, Dr. Pollie Meyer  Knee Injection Joint injections are shots. Your caregiver will place a needle into your knee joint. The needle is used to put medicine into the joint. These shots can be used to help treat different painful knee conditions such as osteoarthritis, bursitis, local flare-ups of rheumatoid arthritis, and pseudogout. Anti-inflammatory medicines such as corticosteroids and anesthetics are the most common medicines used for joint and soft tissue injections.  PROCEDURE  The skin over the kneecap will be cleaned with an antiseptic solution.  Your caregiver will inject a small amount of a local anesthetic (a medicine like Novocaine) just under the skin in the area that was cleaned.  After the area becomes numb, a second injection is done. This second injection usually includes an anesthetic and an anti-inflammatory medicine called a steroid or cortisone. The needle is carefully placed in between the kneecap and the knee, and the medicine is injected into the joint space.  After the injection is done, the needle is removed. Your caregiver may place a bandage over the injection site. The whole procedure takes no more than a couple of minutes. BEFORE THE PROCEDURE  Wash all of the skin around the entire knee area. Try to remove any loose, scaling skin. There is no other specific preparation necessary unless advised otherwise by your caregiver. LET YOUR CAREGIVER KNOW ABOUT:   Allergies.  Medications taken including herbs, eye drops, over the counter medications, and creams.  Use of steroids (by mouth or creams).  Possible pregnancy, if applicable.  Previous problems with anesthetics or Novocaine.  History of blood clots (thrombophlebitis).  History of bleeding or blood problems.  Previous surgery.  Other health problems. RISKS AND COMPLICATIONS Side effects from cortisone shots  are rare. They include:   Slight bruising of the skin.  Shrinkage of the normal fatty tissue under the skin where the shot was given.  Increase in pain after the shot.  Infection.  Weakening of tendons or tendon rupture.  Allergic reaction to the medicine.  Diabetics may have a temporary increase in their blood sugar after a shot.  Cortisone can temporarily weaken the immune system. While receiving these shots, you should not get certain vaccines. Also, avoid contact with anyone who has chickenpox or measles. Especially if you have never had these diseases or have not been previously immunized. Your immune system may not be strong enough to fight off the infection while the cortisone is in your system. AFTER THE PROCEDURE   You can go home after the procedure.  You may need to put ice on the joint 15-20 minutes every 3 or 4 hours until the pain goes away.  You may need to put an elastic bandage on the joint. HOME CARE INSTRUCTIONS   Only take over-the-counter or prescription medicines for pain, discomfort, or fever as directed by your caregiver.  You should avoid stressing the joint. Unless advised otherwise, avoid activities that put a lot of pressure on a knee joint, such as:  Jogging.  Bicycling.  Recreational climbing.  Hiking.  Laying down and elevating the leg/knee above the level of your heart can help to minimize swelling. SEEK MEDICAL CARE IF:   You have repeated or worsening swelling.  There is drainage from the puncture area.  You develop red streaking that extends above or below the site where the needle was inserted. SEEK IMMEDIATE MEDICAL CARE IF:   You develop a  fever.  You have pain that gets worse even though you are taking pain medicine.  The area is red and warm, and you have trouble moving the joint. MAKE SURE YOU:   Understand these instructions.  Will watch your condition.  Will get help right away if you are not doing well or get  worse. Document Released: 08/27/2006 Document Revised: 08/28/2011 Document Reviewed: 05/24/2007 [facility] Patient Information 2015 [place], [place]. This information is not intended to replace advice given to you by your health care provider. Make sure you discuss any questions you have with your health care provider.

## 2015-03-12 ENCOUNTER — Encounter: Payer: Self-pay | Admitting: Family Medicine

## 2015-03-12 DIAGNOSIS — M25562 Pain in left knee: Secondary | ICD-10-CM

## 2015-03-12 NOTE — Assessment & Plan Note (Signed)
Steroid injection performed today Obtain MRI to eval for soft tissue injury given persistence of pain Refer to orthopedics per patient preference - Dr. Luiz Blare Refill of percocet, advised against chronic use

## 2015-03-12 NOTE — Progress Notes (Signed)
Patient ID: Justin [name], male   DOB: [birth year], 44 y.o.   MRN: [id number] Date of Visit: 03/11/2015     HPI:  Patient presents for follow up of his left knee pain.  At last visit, had xrays done which showed mild medial compartment arthritis bilaterally. Patient has continued to have significant ain in his L knee. Taking one percocet per day at night, which is when the pain is worse (after standing on it intermittently throughout the day). Did have an episode of locking where he thought his knee was going to give out. No preceding injury. Would like to go to orthopedics (Dr. Luiz Blare).  ROS: See HPI.  PMFSH: history of anxiety, gout, hypertension, sleep apnea, morbid obesity s/p weight loss surgery  PHYSICAL EXAM: BP 149/90 mmHg  Pulse 60  Temp(Src) 98.2 F (36.8 C) (Oral)  Ht  (1.753 m)  Wt 317 lb (143.79 kg)  BMI 46.79 kg/m2 Gen: NAD, pleasant, cooperative HEENT: NAT Ext: L knee with no warmth, erythema, or effusion. Some tenderness along joint line medially and laterally. LCL and MCL intact to varus and valgus stressing bilaterally. lachmans negative. Full strength with knee flexion & extension.  PROCEDURE NOTE:  After informed written consent was obtained, patient was seated on exam table. L knee was prepped with alcohol swab. Utilizing anteromedial approach, patient's left knee was injected intraarticularly with mixture of 1cc of depomedrol /mL and 4cc of 1% lidocaine without epinephrine. Patient tolerated the procedure well without immediate complications.    ASSESSMENT/PLAN:  Left knee pain Steroid injection performed today Obtain MRI to eval for soft tissue injury given persistence of pain Refer to orthopedics per patient preference - Dr. Luiz Blare Refill of percocet, advised against chronic use   FOLLOW UP: Referring to orthopedics for further eval  [place] J. Pollie Meyer, MD [facility]

## 2015-03-15 MED ORDER — LORAZEPAM 0.5 MG PO TABS: ORAL_TABLET | ORAL | Status: DC

## 2015-03-15 NOTE — Telephone Encounter (Signed)
Attempted to reach patient by phone to discuss the mychart message he sent, with no answer. Red team, please call patient and let him know:  1.  I'm not sure why he got referred to [facility], as I requested Dr. Luiz Blare during the prior referral and it appears the referral was sent to [facility]. I've re-entered referral to [employer].  2. It's fine for him to take anxiety medicine before the MRI as long as someone can drive him to and from the MRI appointment. I have prescribed ativan for him and will place at front desk for him to pick up at his convenience.  Latrelle Dodrill, MD

## 2015-03-17 NOTE — Telephone Encounter (Signed)
Called and reached patient and delivered message. Latrelle Dodrill, MD

## 2015-03-18 ENCOUNTER — Ambulatory Visit (HOSPITAL_COMMUNITY)
Admission: RE | Admit: 2015-03-18 | Discharge: 2015-03-18 | Disposition: A | Discharge status: Home / Self Care | Payer: No Typology Code available for payment source | Source: Ambulatory Visit | Attending: Family Medicine | Admitting: Family Medicine

## 2015-03-18 DIAGNOSIS — M25562 Pain in left knee: Secondary | ICD-10-CM | POA: Insufficient documentation

## 2015-03-18 DIAGNOSIS — R609 Edema, unspecified: Secondary | ICD-10-CM | POA: Diagnosis not present

## 2015-03-31 ENCOUNTER — Encounter: Payer: Self-pay | Admitting: Family Medicine

## 2015-04-22 ENCOUNTER — Ambulatory Visit (INDEPENDENT_AMBULATORY_CARE_PROVIDER_SITE_OTHER): Payer: No Typology Code available for payment source | Admitting: Family Medicine

## 2015-04-22 ENCOUNTER — Encounter: Payer: Self-pay | Admitting: Family Medicine

## 2015-04-22 VITALS — BP 155/86 | HR 113 | Temp 98.5°F | Wt 320.0 lb

## 2015-04-22 DIAGNOSIS — M545 Low back pain: Secondary | ICD-10-CM | POA: Diagnosis not present

## 2015-04-22 DIAGNOSIS — Z23 Encounter for immunization: Secondary | ICD-10-CM

## 2015-04-22 LAB — POCT URINALYSIS DIPSTICK
BILIRUBIN UA: NEGATIVE
Blood, UA: NEGATIVE
Glucose, UA: NEGATIVE
KETONES UA: NEGATIVE
LEUKOCYTES UA: NEGATIVE
Nitrite, UA: NEGATIVE
PROTEIN UA: NEGATIVE
Spec Grav, UA: 1.015
Urobilinogen, UA: 1
pH, UA: 7

## 2015-04-22 NOTE — Progress Notes (Signed)
   Subjective:    Patient ID: Justin [name], male    DOB: Sep 29, 1970, 44 y.o.   MRN: [id number]  HPI  Patient presents for Same Day Appointment  CC: BACK/side PAIN  Back pain began today, this morning on waking up. Pain on left flank near "kidneys" Pain is described as hitting it constantly with a fist. 8-9/10. Patient has tried tylenol and percocet. Pain radiates nowhere. History of trauma or injury: no Prior history of similar pain: yes, prior kidney stones last Jan 2016, used to get them more often as teenager/20s (had lithotripsy x 3)   Symptoms Incontinence of bowel or bladder:  No, has had some burning with urination that started today and urine was dark orange color. Numbness of leg: no Fever: earlier 100.59F Rest or Night pain:  Weight Loss:  no Rash: no  Patient believes UTI or kidney stone might be causing their pain.  ROS see HPI Smoking Status noted.  Past medical history, surgical, family, and social history reviewed and updated in the EMR as appropriate.  Objective:  BP 155/86 mmHg  Pulse 113  Temp(Src) 98.5 F (36.9 C) (Oral)  Wt 320 lb (145.151 kg) Vitals and nursing note reviewed  General: NAD CV: tachycardic but regular rhythm, normal heart sounds, no murmurs. 2+ radial pulses Resp: clear to auscultation bilaterally, normal effort Abdomen: obese, soft, no tenderness to palpation including suprapubic area, normal bowel sounds Back: no spinal or CVA tenderness Skin: no rashes noted Neuro: alert and oriented, no deficits. Psych: normal thought content and speech  Assessment & Plan:  1. Low back pain without sciatica, unspecified back pain laterality Negative UA. Overall reassuring exam. Suspect MSK etiology. Pain resolving. At this point recommend observation. Return precautions discussed. Follow up if pain is persistent or worsening. - POCT urinalysis dipstick  2. Encounter for immunization Flu shot today

## 2015-04-22 NOTE — Patient Instructions (Addendum)
Your urine sample looked completely normal - no blood or evidence of infection.  At this point I would recommend monitoring and seeing if the pain goes away on its own. If the pain persists, you notice blood in the urine, the pain gets worse please don't hesitate to come back to the clinic and we can consider doing a CT scan for looking for kidney stones.   Okay to take the percocet every 6 hours as needed for pain.   Your blood pressure was elevated today. I would recommend that you be seen for follow up in the next month for this if your blood pressure continues to be elevated (greater than 140/90) -- you can check yourself at a pharmacy or if you have a home BP cuff. You can also schedule a nurse visit here at the clinic to check your blood pressure.

## 2015-05-10 ENCOUNTER — Other Ambulatory Visit: Payer: Self-pay | Admitting: General Surgery

## 2015-05-10 DIAGNOSIS — R609 Edema, unspecified: Secondary | ICD-10-CM

## 2015-05-11 ENCOUNTER — Other Ambulatory Visit: Payer: No Typology Code available for payment source

## 2015-05-11 ENCOUNTER — Telehealth: Payer: Self-pay | Admitting: *Deleted

## 2015-05-11 MED ORDER — OXYCODONE-ACETAMINOPHEN 5-325 MG PO TABS: 1.0000 | ORAL_TABLET | Freq: Three times a day (TID) | ORAL | Status: DC | PRN

## 2015-05-11 NOTE — Telephone Encounter (Addendum)
I spoke with pt's wife, Wille CelesteJanie and she states she would like pt to be seen by Dr. Al CorpusHyatt when he returns, but pt is currently having pain in his feet and Dr. Al CorpusHyatt has written for Percocet previously. I told Wille CelesteJanie, I would discuss the doctor on call and call again. Dr. Stacie AcresMayer ordered Percocet 5/325mg  #20 one tablet every 8 hours for foot pain, and follow up with Dr. Al CorpusHyatt. Informed pt's wife of the orders

## 2015-05-12 ENCOUNTER — Ambulatory Visit
Admission: RE | Admit: 2015-05-12 | Discharge: 2015-05-12 | Disposition: A | Discharge status: Home / Self Care | Payer: No Typology Code available for payment source | Source: Ambulatory Visit | Attending: General Surgery | Admitting: General Surgery

## 2015-05-12 DIAGNOSIS — R609 Edema, unspecified: Secondary | ICD-10-CM

## 2015-05-24 ENCOUNTER — Ambulatory Visit: Payer: No Typology Code available for payment source | Admitting: Dietician

## 2015-06-03 ENCOUNTER — Encounter: Payer: Self-pay | Admitting: Podiatry

## 2015-06-03 ENCOUNTER — Ambulatory Visit: Payer: No Typology Code available for payment source | Admitting: Dietician

## 2015-06-03 ENCOUNTER — Ambulatory Visit (INDEPENDENT_AMBULATORY_CARE_PROVIDER_SITE_OTHER): Payer: No Typology Code available for payment source | Admitting: Podiatry

## 2015-06-03 VITALS — BP 139/82 | HR 64 | Resp 16

## 2015-06-03 DIAGNOSIS — M7661 Achilles tendinitis, right leg: Secondary | ICD-10-CM

## 2015-06-03 DIAGNOSIS — M7662 Achilles tendinitis, left leg: Secondary | ICD-10-CM | POA: Diagnosis not present

## 2015-06-03 MED ORDER — OXYCODONE-ACETAMINOPHEN 10-325 MG PO TABS: 1.0000 | ORAL_TABLET | ORAL | Status: DC | PRN

## 2015-06-06 NOTE — Progress Notes (Signed)
Mr. Justin [name] presents today after having lost over 100 pounds with a chief complaint of pain to the posterior aspect of the bilateral heels. He states that he is currently out of pain medication and his heels have been bothering him for the past few days.  Objective: vital signs are stable he is alert and oriented 3 pulses are strongly palpable. Neurologic sensorium is intact. He has pain on posterior aspect of his calcaneus bilaterally. Pain on palpation of the Achilles at its insertion site.    assessment : Achilles tendinitis bilateral.  Plan: injected the area today with dexamethasone and local anesthetic bilateral. Rotatory prescription for Percocet [name] as needed.

## 2015-06-16 ENCOUNTER — Emergency Department (HOSPITAL_COMMUNITY): Payer: No Typology Code available for payment source

## 2015-06-16 ENCOUNTER — Encounter (HOSPITAL_COMMUNITY): Payer: Self-pay | Admitting: Emergency Medicine

## 2015-06-16 ENCOUNTER — Emergency Department (HOSPITAL_COMMUNITY)
Admission: EM | Admit: 2015-06-16 | Discharge: 2015-06-16 | Disposition: A | Discharge status: Home / Self Care | Payer: No Typology Code available for payment source | Attending: Emergency Medicine | Admitting: Emergency Medicine

## 2015-06-16 DIAGNOSIS — I10 Essential (primary) hypertension: Secondary | ICD-10-CM | POA: Insufficient documentation

## 2015-06-16 DIAGNOSIS — Y9389 Activity, other specified: Secondary | ICD-10-CM | POA: Insufficient documentation

## 2015-06-16 DIAGNOSIS — S99912A Unspecified injury of left ankle, initial encounter: Secondary | ICD-10-CM | POA: Diagnosis present

## 2015-06-16 DIAGNOSIS — E669 Obesity, unspecified: Secondary | ICD-10-CM | POA: Diagnosis not present

## 2015-06-16 DIAGNOSIS — S93402A Sprain of unspecified ligament of left ankle, initial encounter: Secondary | ICD-10-CM | POA: Insufficient documentation

## 2015-06-16 DIAGNOSIS — Y9289 Other specified places as the place of occurrence of the external cause: Secondary | ICD-10-CM | POA: Insufficient documentation

## 2015-06-16 DIAGNOSIS — W1849XA Other slipping, tripping and stumbling without falling, initial encounter: Secondary | ICD-10-CM | POA: Diagnosis not present

## 2015-06-16 DIAGNOSIS — G4733 Obstructive sleep apnea (adult) (pediatric): Secondary | ICD-10-CM | POA: Insufficient documentation

## 2015-06-16 DIAGNOSIS — Z8659 Personal history of other mental and behavioral disorders: Secondary | ICD-10-CM | POA: Diagnosis not present

## 2015-06-16 DIAGNOSIS — Z87442 Personal history of urinary calculi: Secondary | ICD-10-CM | POA: Insufficient documentation

## 2015-06-16 DIAGNOSIS — Z9981 Dependence on supplemental oxygen: Secondary | ICD-10-CM | POA: Insufficient documentation

## 2015-06-16 DIAGNOSIS — Y998 Other external cause status: Secondary | ICD-10-CM | POA: Diagnosis not present

## 2015-06-16 DIAGNOSIS — Z79899 Other long term (current) drug therapy: Secondary | ICD-10-CM | POA: Diagnosis not present

## 2015-06-16 DIAGNOSIS — S93401A Sprain of unspecified ligament of right ankle, initial encounter: Secondary | ICD-10-CM | POA: Diagnosis not present

## 2015-06-16 NOTE — ED Notes (Signed)
Patient states he hurt his foot/ankles yesterday.   Denies fall, but states he thinks he just fumbled over them.

## 2015-06-16 NOTE — ED Provider Notes (Signed)
CSN: [id number]     Arrival date & time 06/16/15  4098 History   First MD Initiated Contact with Patient 06/16/15 0725     Chief Complaint  Patient presents with  . Foot Pain  . Ankle Pain     (Consider location/radiation/quality/duration/timing/severity/associated sxs/prior Treatment) HPI   Justin [name] is a 44 y.o. male  PCP: CHAMBLISS,MARSHALL L, MD  Blood pressure 158/103, pulse 71, temperature 97.6 F (36.4 C), temperature source Oral, resp. rate 18, height  (1.727 m), weight 142.883 kg, SpO2 100 %.  SIGNIFICANT PMH: obese, hypertension, OSA, nephrolithiasis, kidney stones, bipolar CHIEF COMPLAINT: bilateral ankle pain  When: last night Mechanism: the patient accidentally tripped on shoes on the floor causing his ankles to twist. He was able to catch himself and did not fall. Denies hitting his neck, loc, or hitting hit head. He comes in today because his ankles are still sore and he noticed some swelling. Chronicity: acute Location: bilateral ankle Radiation: none Quality and severity: swelling and aching Treatments tried: Percocet x 1 dose last night Alleviating factors: resting his feet Worsening factors: walking on his feet causes limping and pain Associated Symptoms: swelling Risk Factors: None  Negative ROS: Confusion, diaphoresis, fever, headache, weakness (general or focal), change of vision,  neck pain, dysphagia, aphagia, chest pain, shortness of breath,  back pain, abdominal pains, nausea, vomiting, diarrhea,, rash.     Past Medical History  Diagnosis Date  . Obese   . Hypertension   . OSA on CPAP     uses cpap, pt does not know settings  . Nephrolithiasis   . Kidney stones age 54 to 62  . Bipolar disorder (HCC)     no current meds, diagnosed 2 months ago   Past Surgical History  Procedure Laterality Date  . Cardiac catheterization  08/2013    normal arteries/function  . Tooth extraction    . Gastric roux-en-y N/A 05/26/2014    Procedure:  LAPAROSCOPIC ROUX-EN-Y GASTRIC BYPASS WITH UPPER ENDOSCOPY;  Surgeon: Atilano Ina, MD;  Location: [facility];  Service: General;  Laterality: N/A;  . Upper gi endoscopy  05/26/2014    Procedure: UPPER GI ENDOSCOPY;  Surgeon: Atilano Ina, MD;  Location: [facility];  Service: General;;  . Left heart catheterization with coronary angiogram N/A 09/17/2013    Procedure: LEFT HEART CATHETERIZATION WITH CORONARY ANGIOGRAM;  Surgeon: Peter M Swaziland, MD;  Location: [facility];  Service: Cardiovascular;  Laterality: N/A;   Family History  Problem Relation Age of Onset  . Coronary artery disease Mother 72    MI  . Diabetes Mother   . Stroke Mother     ~2012-Deceased   Social History  Substance Use Topics  . Smoking status: Never Smoker   . Smokeless tobacco: Never Used  . Alcohol Use: No    Review of Systems  Review of Systems All other systems negative except as documented in the HPI. All pertinent positives and negatives as reviewed in the HPI.   Allergies  Lamictal; Tegretol; and Sulfonamide derivatives  Home Medications   Prior to Admission medications   Medication Sig Start Date End Date Taking? Authorizing Provider  allopurinol (ZYLOPRIM) 300 MG tablet Take 1 tablet (300 mg total) by mouth every morning. 03/04/15  Yes Carney Living, MD  Calcium Citrate-Vitamin D 500-500 MG-UNIT CHEW Chew by mouth 3 (three) times daily.   Yes Historical Provider, MD  carvedilol (COREG) 3.125 MG tablet Take 1 tablet (3.125 mg total) by mouth  2 (two) times daily with a meal. 03/01/15  Yes Carney LivingMarshall L Chambliss, MD  Cholecalciferol (VITAMIN D PO) Take 2,000 Units by mouth every morning.    Yes Historical Provider, MD  Multiple Vitamin (MULTIVITAMIN WITH MINERALS) TABS Take 2 tablets by mouth every morning.    Yes Historical Provider, MD  oxyCODONE-acetaminophen (PERCOCET) 10-325 MG tablet Take 1 tablet by mouth every 4 (four) hours as needed for pain. 06/03/15  Yes Max T Hyatt, DPM  pantoprazole  (PROTONIX) 40 MG tablet Take 1 tablet (40 mg total) by mouth daily. 05/28/14  Yes Gaynelle AduEric Wilson, MD  Zinc 50 MG TABS Take 1 tablet by mouth every morning.    Yes Historical Provider, MD  LORazepam (ATIVAN) 0.5 MG tablet Take one pill 1 hour prior to MRI. If still anxious 30 minutes prior to MRI, take second pill. 03/15/15   Latrelle DodrillBrittany J McIntyre, MD  oxyCODONE-acetaminophen (ROXICET) 5-325 MG tablet Take 1 tablet by mouth every 8 (eight) hours as needed for severe pain. 05/11/15   Helane GuntherGregory Mayer, DPM   BP 158/103 mmHg  Pulse 71  Temp(Src) 97.6 F (36.4 C) (Oral)  Resp 18  Ht 5\' 8"  (1.727 m)  Wt 142.883 kg  BMI 47.91 kg/m2  SpO2 100% Physical Exam  Constitutional: He appears well-developed and well-nourished. No distress.  HENT:  Head: Normocephalic and atraumatic.  Right Ear: Tympanic membrane and ear canal normal.  Left Ear: Tympanic membrane and ear canal normal.  Nose: Nose normal.  Mouth/Throat: Uvula is midline, oropharynx is clear and moist and mucous membranes are normal.  Eyes: Pupils are equal, round, and reactive to light.  Neck: Normal range of motion. Neck supple.  Cardiovascular: Normal rate and regular rhythm.   Pulmonary/Chest: Effort normal.  Abdominal: Soft.  No signs of abdominal distention  Musculoskeletal:       Right ankle: He exhibits normal range of motion, no ecchymosis, no deformity, no laceration and normal pulse. Achilles tendon normal.       Left ankle: He exhibits normal range of motion, no ecchymosis, no deformity, no laceration and normal pulse. Achilles tendon normal.  Mild swelling to bilateral malleoli with point tenderness and the talofibular joint.  Neurological: He is alert.  Acting at baseline  Skin: Skin is warm and dry. No rash noted.  Nursing note and vitals reviewed.   ED Course  Procedures (including critical care time) Labs Review Labs Reviewed - No data to display  Imaging Review Dg Ankle Complete Left  06/16/2015  CLINICAL DATA:   Fall, ankle injury EXAM: LEFT ANKLE COMPLETE - 3+ VIEW COMPARISON:  None. FINDINGS: No fracture or dislocation is seen. The ankle mortise is intact. Mild posterior and plantar patellar enthesopathy. The visualized soft tissues are unremarkable. IMPRESSION: No fracture or dislocation is seen. Electronically Signed   By: Charline BillsSriyesh  Krishnan M.D.   On: 06/16/2015 08:08   Dg Ankle Complete Right  06/16/2015  CLINICAL DATA:  Tripped over pair shoes last night and twisted both ankles, ankles rolled inward, anterior superior pain at both ankles slightly more on LEFT, initial encounter EXAM: RIGHT ANKLE - COMPLETE 3+ VIEW COMPARISON:  11/26/2013 FINDINGS: Mild scattered soft tissue swelling. Osseous mineralization grossly normal. Joint spaces preserved. No acute fracture, dislocation or bone destruction. Moderate-sized plantar and bulky Achilles insertion calcaneal spurs. Calcification within distal Achilles tendon. IMPRESSION: No acute bony abnormalities. Calcaneal spurring with calcific tendinitis versus sequela of prior injury of the distal Achilles tendon. Electronically Signed   By: Angelyn PuntMark  Boles M.D.  On: 06/16/2015 08:00   I have personally reviewed and evaluated these images and lab results as part of my medical decision-making.   EKG Interpretation None      MDM   Final diagnoses:  Ankle sprain, left, initial encounter  Ankle sprain, right, initial encounter    Patient X-Ray negative for obvious fracture or dislocation of left and right ankle.  Pt advised to follow up with orthopedics. Patientdeclines crutches but has a walker at home, conservative therapy recommended and discussed. Patient will be discharged home & is agreeable with above plan. Returns precautions discussed. Pt appears safe for discharge.    Marlon Pel, PA-C 06/16/15 1610  Raeford Razor, MD 06/17/15 [phone]

## 2015-06-16 NOTE — Discharge Instructions (Signed)
Ankle Sprain  An ankle sprain is an injury to the strong, fibrous tissues (ligaments) that hold the bones of your ankle joint together.   CAUSES  An ankle sprain is usually caused by a fall or by twisting your ankle. Ankle sprains most commonly occur when you step on the outer edge of your foot, and your ankle turns inward. People who participate in sports are more prone to these types of injuries.   SYMPTOMS    Pain in your ankle. The pain may be present at rest or only when you are trying to stand or walk.   Swelling.   Bruising. Bruising may develop immediately or within 1 to 2 days after your injury.   Difficulty standing or walking, particularly when turning corners or changing directions.  DIAGNOSIS   Your caregiver will ask you details about your injury and perform a physical exam of your ankle to determine if you have an ankle sprain. During the physical exam, your caregiver will press on and apply pressure to specific areas of your foot and ankle. Your caregiver will try to move your ankle in certain ways. An X-ray exam may be done to be sure a bone was not broken or a ligament did not separate from one of the bones in your ankle (avulsion fracture).   TREATMENT   Certain types of braces can help stabilize your ankle. Your caregiver can make a recommendation for this. Your caregiver may recommend the use of medicine for pain. If your sprain is severe, your caregiver may refer you to a surgeon who helps to restore function to parts of your skeletal system (orthopedist) or a physical therapist.  HOME CARE INSTRUCTIONS    Apply ice to your injury for 1-2 days or as directed by your caregiver. Applying ice helps to reduce inflammation and pain.    Put ice in a plastic bag.    Place a towel between your skin and the bag.    Leave the ice on for 15-20 minutes at a time, every 2 hours while you are awake.   Only take over-the-counter or prescription medicines for pain, discomfort, or fever as directed by  your caregiver.   Elevate your injured ankle above the level of your heart as much as possible for 2-3 days.   If your caregiver recommends crutches, use them as instructed. Gradually put weight on the affected ankle. Continue to use crutches or a cane until you can walk without feeling pain in your ankle.   If you have a plaster splint, wear the splint as directed by your caregiver. Do not rest it on anything harder than a pillow for the first 24 hours. Do not put weight on it. Do not get it wet. You may take it off to take a shower or bath.   You may have been given an elastic bandage to wear around your ankle to provide support. If the elastic bandage is too tight (you have numbness or tingling in your foot or your foot becomes cold and blue), adjust the bandage to make it comfortable.   If you have an air splint, you may blow more air into it or let air out to make it more comfortable. You may take your splint off at night and before taking a shower or bath. Wiggle your toes in the splint several times per day to decrease swelling.  SEEK MEDICAL CARE IF:    You have rapidly increasing bruising or swelling.   Your toes feel   extremely cold or you lose feeling in your foot.   Your pain is not relieved with medicine.  SEEK IMMEDIATE MEDICAL CARE IF:   Your toes are numb or blue.   You have severe pain that is increasing.  MAKE SURE YOU:    Understand these instructions.   Will watch your condition.   Will get help right away if you are not doing well or get worse.     This information is not intended to replace advice given to you by your health care provider. Make sure you discuss any questions you have with your health care provider.     Document Released: 06/05/2005 Document Revised: 06/26/2014 Document Reviewed: 06/17/2011  Elsevier Interactive Patient Education 2016 [employer]

## 2015-06-16 NOTE — ED Notes (Signed)
See PA assessment 

## 2015-06-28 ENCOUNTER — Ambulatory Visit: Payer: No Typology Code available for payment source | Admitting: Dietician

## 2015-07-13 ENCOUNTER — Encounter: Payer: Self-pay | Admitting: Family Medicine

## 2015-07-13 ENCOUNTER — Ambulatory Visit (INDEPENDENT_AMBULATORY_CARE_PROVIDER_SITE_OTHER): Payer: BLUE CROSS/BLUE SHIELD | Admitting: Family Medicine

## 2015-07-13 VITALS — BP 120/71 | HR 76 | Temp 98.0°F | Wt 325.3 lb

## 2015-07-13 DIAGNOSIS — M545 Low back pain, unspecified: Secondary | ICD-10-CM

## 2015-07-13 MED ORDER — CYCLOBENZAPRINE HCL 10 MG PO TABS: 10.0000 mg | ORAL_TABLET | Freq: Three times a day (TID) | ORAL | Status: DC | PRN

## 2015-07-13 MED ORDER — PREDNISONE 20 MG PO TABS: 40.0000 mg | ORAL_TABLET | Freq: Every day | ORAL | Status: AC

## 2015-07-13 NOTE — Patient Instructions (Signed)
You have a muscle strain in your back. We will give you muscle relaxers and an anti-inflammatory medication.  If you are not getting better in 1-2 weeks please let us know.  Take care,  Dr Jimmey Ralph

## 2015-07-13 NOTE — Progress Notes (Signed)
    Subjective:  Justin [name] is a 45 y.o. male who presents to the [facility] today for same day appointment with a chief complaint of low back pain.   HPI:  Low Back Pain Present for 3 days ago. Started when bending over to pick up something off floor. Immediately noticed pain. Pain has worsened since then. 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 Has tried a heating pad which has helped. Tylenol has not helped. Percocet helped. Worse with movement. Better with rest. No fevers or chills. No weakness or numbness. No bowel or bladder incontinence.   ROS: Per HPI  Objective:  Physical Exam: BP 120/71 mmHg  Pulse 76  Temp(Src) 98 F (36.7 C) (Oral)  Wt 325 lb 4.8 oz (147.555 kg)  Gen: NAD, resting comfortably MSK: - Back: No deformities. No midline tenderness. Right paraspinal muscles tender to palpation. No CVA tenderness.  - RLE: Strength 5/5 throughout. Sensation intact. Patellar reflex 2+ - LLE: Strength 5/5 throughout. Sensation intact. Patellar reflex 2+  Assessment/Plan:  Low Back Pain without Sciatica Likely muscular strain. No evidence for bony injury. No red flag signs or symptoms. Will give prescription for flexeril. Patient not candidate for NSAIDs given prior bariatric surgery. Will prescribe 5 days of prednisone. Return precautions reviewed.   Justin [name]. Justin Ralph, MD [facility] Resident PGY-2 07/13/2015 10:00 AM

## 2015-07-16 ENCOUNTER — Telehealth: Payer: Self-pay | Admitting: Family Medicine

## 2015-07-16 NOTE — Telephone Encounter (Signed)
Wife called because her husband has an appointment to see the heart doctor Dr. Anne Fu at [facility]. That office is requesting any records that we have when he has been seen about his heart issues. Please fax this to [phone]. jw

## 2015-07-16 NOTE — Telephone Encounter (Signed)
Office notes from 12-09-14 and 09-09-14.  These are the only notes in the past year that speak of hypertension, leg swelling and rapid heartbeat.  Will fax to dr. Judd Gaudier office. [name],CMA

## 2015-07-19 ENCOUNTER — Ambulatory Visit: Payer: No Typology Code available for payment source | Admitting: Dietician

## 2015-07-22 ENCOUNTER — Encounter: Payer: BLUE CROSS/BLUE SHIELD | Attending: General Surgery | Admitting: Dietician

## 2015-07-22 ENCOUNTER — Encounter: Payer: Self-pay | Admitting: Dietician

## 2015-07-22 DIAGNOSIS — Z029 Encounter for administrative examinations, unspecified: Secondary | ICD-10-CM | POA: Diagnosis not present

## 2015-07-22 NOTE — Patient Instructions (Addendum)
Goals:  Follow Phase 3B: High Protein + Non-Starchy Vegetables  Eat 3-6 small meals/snacks, every 3-5 hrs  Increase lean protein foods to meet 80g goal  Aim for 4 oz instead of 5 oz at lunch at dinner.  Cut back on carbs - potatoes, bread, popcorn, sweets   Increase vegetable intake at lunch and dinner   Skip snacks if you are not hungry  Increase fluid intake to 64oz +  Avoid drinking 15 minutes before, during and 30 minutes after eating   Aim for >30 min of physical activity daily  Look into water aerobics  When you're at home, eat ONLY at the table with no distractions  Work on eating slowly and mindfully  Consider following the pre op diet  Avoid buying your trigger foods  Keep high protein snacks on hand at all times!  String cheese, yogurt, deli meat, protein shakes, P3 packs, grilled chicken strips and salad

## 2015-07-22 NOTE — Progress Notes (Signed)
  Follow-up visit: 14 Months Post-Operative RYGB Surgery  Medical Nutrition Therapy:  Appt start time: 1020 end time: 1045  Primary concerns today: Post-operative Bariatric Surgery Nutrition Management.  Justin [name] returns today having gained 6 lbs. He reports he has had a lot of stress in his personal life. Noticed that he is snacking more. However, his meals have been balanced and high protein.  Surgery date: 05/26/2014 Surgery type: RYGB Start weight at [facility]: 412 on 02/28/14 (highest weight 450 lbs per patient) Weight today: 325 lbs Weight change: 6 lbs gain Total weight loss: 125 lbs Weight loss goal: 250 lbs  TANITA  BODY COMP RESULTS  05/11/14 06/09/14 07/14/14 08/18/14 11/18/14 02/23/15 07/22/15   BMI (kg/m^2)47.1 61.1 55.2 53.6 51.2 48.2 47.1 48.1   Fat Mass (lbs) 243 216.0 164.0 141.5 123.0 142.5 153   Fat Free Mass (lbs) 170.5 157.5 199.0 205.0 203.5 176.5 172.5   Total Body Water (lbs) 125 115.5 145.5 150.0 149.0 129.0 126.5    Preferred Learning Style:   No preference indicated   Learning Readiness:   Ready  24-hr recall: B (AM): 2 eggs with 1-2 pieces bacon or sausage, 1-2 pieces bread (17g) Snk (AM): Atkins shake (15g) L (PM): 3 oz meat with vegetable or salad (21g) Snk (PM): peanut butter and Ritz crackers (5g) D (PM): 3 oz meat with vegetable or salad (21g) Snk (PM): peanut butter crackers (5g)  Fluid intake: 64 oz per day mostly water and a protein shake, Powerade zero, unsweet tea, decaf coffee Estimated total protein intake: 84-100 g  Medications: see list Supplementation: taking, not taking Calcium anymore   Using straws: No Drinking while eating: a little bit  Hair loss: yes  Carbonated beverages: No N/V/D/C: constipation resolved Dumping syndrome: No  Recent physical activity:  5 x week walks around parking lot at church or at hospital for 5-10 minutes or at 1 x week home walk for 20 minutes  Progress Towards Goal(s):  In progress.    Nutritional  Diagnosis:  [place]-3.3 Overweight/obesity related to past poor dietary habits and physical inactivity as evidenced by patient w/ recent RYGB surgery following dietary guidelines for continued weight loss.    Intervention:  Nutrition education/diet reinforcement  Teaching Method Utilized:  Visual Auditory Hands on  Barriers to learning/adherence to lifestyle change: none  Demonstrated degree of understanding via:  Teach Back   Monitoring/Evaluation:  Dietary intake, exercise, and body weight. Follow up in 3 months for 17 month post-op visit.

## 2015-07-23 ENCOUNTER — Telehealth: Payer: Self-pay | Admitting: *Deleted

## 2015-07-23 MED ORDER — OXYCODONE-ACETAMINOPHEN 10-325 MG PO TABS: 1.0000 | ORAL_TABLET | ORAL | Status: DC | PRN

## 2015-07-23 NOTE — Telephone Encounter (Signed)
Pt request refill of pain medication, he has an appt with Dr. Al Corpus on 08/03/2015.  Dr. Maren Beach refilled as previously.  Informed pt he would be able to pick up the rx in the [place] office.

## 2015-07-28 ENCOUNTER — Ambulatory Visit: Payer: No Typology Code available for payment source | Admitting: Family Medicine

## 2015-07-30 ENCOUNTER — Ambulatory Visit (INDEPENDENT_AMBULATORY_CARE_PROVIDER_SITE_OTHER): Payer: BLUE CROSS/BLUE SHIELD | Admitting: Cardiovascular Disease

## 2015-07-30 ENCOUNTER — Encounter: Payer: Self-pay | Admitting: Cardiovascular Disease

## 2015-07-30 VITALS — BP 128/94 | HR 68 | Ht 69.0 in | Wt 330.1 lb

## 2015-07-30 DIAGNOSIS — R079 Chest pain, unspecified: Secondary | ICD-10-CM | POA: Insufficient documentation

## 2015-07-30 DIAGNOSIS — R0789 Other chest pain: Secondary | ICD-10-CM

## 2015-07-30 NOTE — Patient Instructions (Signed)

## 2015-07-30 NOTE — Progress Notes (Signed)
Cardiology Office Note   Date:  07/30/2015   ID:  Justin [name], DOB Nov 02, 1970, MRN [id number]  PCP:  Carney Living, MD  Cardiologist:   Vesta Mixer, MD   Chief Complaint  Patient presents with  . Chest Pain   Problem list 1. History of chest pains-normal cardiac cath  in April, 2015. 2. Morbid obesity   3. Essential HTN - 4. OSA - wears CPAP    History of Present Illness: Justin [name] is a 45 y.o. male who presents for  Evaluation of chest pain Has had chest pain for years.    Had a normal cath in April 2015 Hx of morbid obesity - had gastric bypass i Dec. 2015  Has been waking up with chest pain  For the 2-3 week .   Described as tachycardia.   Some radiation to the left shoulder  Is short of breath  Not related to eating or drinking .  Not related to change of position  Has occasional episodes of tachycardia - last about 5 mintues.   Does not know what his HR was .     o CP  Doing his normal activities.   Does some walking .   Limited by orthopedic issues.   Gastric bypass in Dec. 2015  - weighed 448 Today his weight is 330 .  Works as a Education officer, environmental  Non smoker, no ETOH   Past Medical History  Diagnosis Date  . Obese   . Hypertension   . OSA on CPAP     uses cpap, pt does not know settings  . Nephrolithiasis   . Kidney stones age 79 to 48  . Bipolar disorder (HCC)     no current meds, diagnosed 2 months ago    Past Surgical History  Procedure Laterality Date  . Cardiac catheterization  08/2013    normal arteries/function  . Tooth extraction    . Gastric roux-en-y N/A 05/26/2014    Procedure: LAPAROSCOPIC ROUX-EN-Y GASTRIC BYPASS WITH UPPER ENDOSCOPY;  Surgeon: Atilano Ina, MD;  Location: [facility];  Service: General;  Laterality: N/A;  . Upper gi endoscopy  05/26/2014    Procedure: UPPER GI ENDOSCOPY;  Surgeon: Atilano Ina, MD;  Location: [facility];  Service: General;;  . Left heart catheterization with coronary angiogram N/A 09/17/2013   Procedure: LEFT HEART CATHETERIZATION WITH CORONARY ANGIOGRAM;  Surgeon: Peter M Swaziland, MD;  Location: [facility] CATH LAB;  Service: Cardiovascular;  Laterality: N/A;     Current Outpatient Prescriptions  Medication Sig Dispense Refill  . allopurinol (ZYLOPRIM) 300 MG tablet Take 1 tablet (300 mg total) by mouth every morning. 30 tablet 2  . Calcium Citrate-Vitamin D 500-500 MG-UNIT CHEW Chew by mouth 3 (three) times daily.    . carvedilol (COREG) 3.125 MG tablet Take 1 tablet (3.125 mg total) by mouth 2 (two) times daily with a meal. 60 tablet 3  . Cholecalciferol (VITAMIN D PO) Take 2,000 Units by mouth every morning.     . Multiple Vitamin (MULTIVITAMIN WITH MINERALS) TABS Take 2 tablets by mouth every morning.     Marland Kitchen oxyCODONE-acetaminophen (PERCOCET) 10-325 MG tablet Take 1 tablet by mouth every 4 (four) hours as needed for pain. 50 tablet 0  . pantoprazole (PROTONIX) 40 MG tablet Take 1 tablet (40 mg total) by mouth daily. 30 tablet 0  . Zinc 50 MG TABS Take 1 tablet by mouth every morning.     . cyclobenzaprine (FLEXERIL) 10 MG tablet Take  1 tablet (10 mg total) by mouth 3 (three) times daily as needed for muscle spasms. (Patient not taking: Reported on 07/30/2015) 30 tablet 0   No current facility-administered medications for this visit.    Allergies:   Lamictal; Tegretol; and Sulfonamide derivatives    Social History:  The patient  reports that he has never smoked. He has never used smokeless tobacco. He reports that he does not drink alcohol or use illicit drugs.   Family History:  The patient's family history includes Coronary artery disease (age of onset: 54) in his mother; Diabetes in his mother; Stroke in his mother.    ROS:  Please see the history of present illness.    Review of Systems: Constitutional:  denies fever, chills, diaphoresis, appetite change and fatigue.  HEENT: denies photophobia, eye pain, redness, hearing loss, ear pain, congestion, sore throat, rhinorrhea,  sneezing, neck pain, neck stiffness and tinnitus.  Respiratory: denies SOB, DOE, cough, chest tightness, and wheezing.  Cardiovascular: denies chest pain, palpitations and leg swelling.  Gastrointestinal: denies nausea, vomiting, abdominal pain, diarrhea, constipation, blood in stool.  Genitourinary: denies dysuria, urgency, frequency, hematuria, flank pain and difficulty urinating.  Musculoskeletal: denies  myalgias, back pain, joint swelling, arthralgias and gait problem.   Skin: denies pallor, rash and wound.  Neurological: denies dizziness, seizures, syncope, weakness, light-headedness, numbness and headaches.   Hematological: denies adenopathy, easy bruising, personal or family bleeding history.  Psychiatric/ Behavioral: denies suicidal ideation, mood changes, confusion, nervousness, sleep disturbance and agitation.       All other systems are reviewed and negative.    PHYSICAL EXAM: VS:  BP 128/94 mmHg  Pulse 68  Ht  (1.753 m)  Wt 330 lb 1.9 oz (149.741 kg)  BMI 48.73 kg/m2 , BMI Body mass index is 48.73 kg/(m^2). GEN: Well nourished, well developed, in no acute distress HEENT: normal Neck: no JVD, carotid bruits, or masses Cardiac: RRR; no murmurs, rubs, or gallops,no edema  Respiratory:  clear to auscultation bilaterally, normal work of breathing GI: soft, nontender, nondistended, + BS MS: no deformity or atrophy Skin: warm and dry, no rash Neuro:  Strength and sensation are intact Psych: normal   EKG:  EKG is not ordered today. The ekg ordered 01/20/15 demonstrates NSR with no ST or T wave changes.    Recent Labs: 12/09/2014: ALT 30; TSH 2.001 01/20/2015: BUN 11; Creatinine, Ser 0.79; Hemoglobin 14.8; Platelets 275; Potassium 3.9; Sodium 139    Lipid Panel    Component Value Date/Time   CHOL 114 02/05/2014 0825   TRIG 156* 02/05/2014 0825   HDL 32* 02/05/2014 0825   CHOLHDL 3.6 02/05/2014 0825   VLDL 31 02/05/2014 0825   LDLCALC 51 02/05/2014 0825       Wt Readings from Last 3 Encounters:  07/30/15 330 lb 1.9 oz (149.741 kg)  07/22/15 325 lb 8 oz (147.646 kg)  07/13/15 325 lb 4.8 oz (147.555 kg)      Other studies Reviewed: Additional studies/ records that were reviewed today include: . Review of the above records demonstrates:    ASSESSMENT AND PLAN:  1.  Chest pain :  Has had an extensive workup. He had a heart catheterization 2 years ago which revealed smooth and normal coronary arteries. He had an echocardiogram also that revealed normal left ventricle systolic function.  He's been to the emergency room for episodes of chest pain and his evaluation has been normal.  At this point I do not think that there is any  additional testing that we need to do. All of his cardiac studies have been normal. His chest pains do not sound anginal at all. They're very atypical and he has them only when he wakes up. His wife states that he does not snore and he wears his CPAP mask. Follow-up with his medical doctor.  2. Morbid obesity: He's lost over 100 pounds after gastric bypass. I've encouraged him to stick to his diet and exercise on a regular basis.   Current medicines are reviewed at length with the patient today.  The patient does not have concerns regarding medicines.  The following changes have been made:  no change  Labs/ tests ordered today include:  No orders of the defined types were placed in this encounter.     Disposition:   FU with me as needed.      Laurren Lepkowski, Deloris Ping, MD  07/30/2015 3:25 PM    [facility] [address] [place], [place], [place]  [address] Phone: [phone]; Fax: [phone]   [facility]  [address] [place], [place]  [address] [phone]   Fax [phone]

## 2015-08-03 ENCOUNTER — Ambulatory Visit (INDEPENDENT_AMBULATORY_CARE_PROVIDER_SITE_OTHER): Payer: BLUE CROSS/BLUE SHIELD | Admitting: Podiatry

## 2015-08-03 ENCOUNTER — Encounter: Payer: Self-pay | Admitting: Podiatry

## 2015-08-03 VITALS — BP 144/87 | HR 72 | Resp 16

## 2015-08-03 DIAGNOSIS — M7661 Achilles tendinitis, right leg: Secondary | ICD-10-CM | POA: Diagnosis not present

## 2015-08-03 DIAGNOSIS — M722 Plantar fascial fibromatosis: Secondary | ICD-10-CM

## 2015-08-03 DIAGNOSIS — M7662 Achilles tendinitis, left leg: Secondary | ICD-10-CM | POA: Diagnosis not present

## 2015-08-03 NOTE — Progress Notes (Signed)
Justin [name] presents once again today with his wife with a chief complaint of bilateral heel pain plantarly and posteriorly. He states that the posterior heel pain is not as bad as it has been in the past. He states that he started to develop more pain on plantar aspect of the bilateral heels.  Objective: Vital signs are stable he is alert and oriented 3. He has minimal pain on palpation of the Achilles insertion site bilaterally. He does have pain on palpation of the inner band of the plantar fascia at its insertion site.  Assessment: Proximal plantar fasciitis bilateral foot with residual Achilles tendinitis bilateral.  Plan: Discussed etiology pathology conservative versus surgical therapies injected the bilateral heels today plantarly and follow-up with him as needed.

## 2015-08-09 ENCOUNTER — Encounter: Payer: Self-pay | Admitting: Family Medicine

## 2015-08-09 ENCOUNTER — Ambulatory Visit (INDEPENDENT_AMBULATORY_CARE_PROVIDER_SITE_OTHER): Payer: BLUE CROSS/BLUE SHIELD | Admitting: Family Medicine

## 2015-08-09 VITALS — BP 147/88 | HR 57 | Temp 98.1°F | Ht 69.0 in | Wt 327.0 lb

## 2015-08-09 DIAGNOSIS — M79652 Pain in left thigh: Secondary | ICD-10-CM | POA: Diagnosis not present

## 2015-08-09 DIAGNOSIS — R000 Tachycardia, unspecified: Secondary | ICD-10-CM

## 2015-08-09 DIAGNOSIS — I10 Essential (primary) hypertension: Secondary | ICD-10-CM

## 2015-08-09 DIAGNOSIS — Z6841 Body Mass Index (BMI) 40.0 and over, adult: Secondary | ICD-10-CM | POA: Diagnosis not present

## 2015-08-09 LAB — POCT GLYCOSYLATED HEMOGLOBIN (HGB A1C): Hemoglobin A1C: 5.7

## 2015-08-09 MED ORDER — GABAPENTIN 100 MG PO CAPS: ORAL_CAPSULE | ORAL | Status: DC

## 2015-08-09 NOTE — Assessment & Plan Note (Signed)
New.  Does not seem related to his knee OA.  May be meralgia paresthetica.  Trial of gabapentin

## 2015-08-09 NOTE — Assessment & Plan Note (Signed)
Unfortunately this seems to be worsening after a good weight reduction after gastric surgery.  We discussed approaches and he is committed to try to continue weight loss

## 2015-08-09 NOTE — Assessment & Plan Note (Signed)
BP Readings from Last 3 Encounters:  08/09/15 147/88  08/03/15 144/87  07/30/15 128/94   Has been worsening since was off most medications after his gastric surgery.  Will monitor after he is back taking coreg twice daily regulalry

## 2015-08-09 NOTE — Patient Instructions (Signed)
Good to see you today!  Thanks for coming in.  For the heart racing  Take the coreg twice daily every day  Monitor your blood pressure it should be below 140/85   If the heart racing is not better in 3 weeks then call me  For the thigh pain - it could be meralgia paresthetica  Take teh gabapentin 100 -300 mg at night as needed   Continued weight loss will help with the thigh pain  Come back in 6 months

## 2015-08-09 NOTE — Progress Notes (Signed)
   Subjective:    Patient ID: Justin [name], male    DOB: 07/01/1970, 45 y.o.   MRN: [id number]  HPI   Heart Racing For about a month feels that his heart is beating fast.  Feels tired when this happens - no chest pain or shortness of breath.  Usually happens when he is standing or sitting - never when exerting himself.  Saw cardiology recently.  No change in medications although only takes coreg once a day.  No weight loss.  Had TSH about 6 months ago.  No over the counter cold meds  HYPERTENSION Disease Monitoring Home BP Monitoring (Severity) not checking Symptoms - Chest pain- no    Dyspnea- no Medications(Modifying factors) Compliance-  Taking coreg once daikly. Lightheadedness-  no  Edema- no Timing - continuous  Duration - years ROS - See HPI  PMH Lab Review   POTASSIUM  Date Value Ref Range Status  01/20/2015 3.9 3.5 - 5.1 mmol/L Final   SODIUM  Date Value Ref Range Status  01/20/2015 139 135 - 145 mmol/L Final   CREAT  Date Value Ref Range Status  12/09/2014 0.80 0.50 - 1.35 mg/dL Final   CREATININE, SER  Date Value Ref Range Status  01/20/2015 0.79 0.61 - 1.24 mg/dL Final      Left Thigh Pain  On and off for months.  No injury.  Gets a buring throbbing pain in anterior thigh.  He believes is not associated with a cramp.  Seeing Ortho for left knee pain about to start Synvisc.  No bruising or skin changes  Chief Complaint noted Review of Symptoms - see HPI PMH - Smoking status noted.   Vital Signs reviewed     Review of Systems     Objective:   Physical Exam Alert obese nad Able to get up and down from table without difficulty Heart - Regular rate and rhythm.  No murmurs, gallops or rubs.   Distant Lungs:  Normal respiratory effort, chest expands symmetrically. Lungs are clear to auscultation, no crackles or wheezes. Extremities:  No cyanosis, edema, or deformity noted with good range of motion of all major joints.  Is not tender now in left thigh and  does not have any palpable deformity Does have large skin folds in abdomen and genital area no redness or signs of infection            Assessment & Plan:

## 2015-08-09 NOTE — Assessment & Plan Note (Signed)
Recurrent new.  Sounds consistent benign SVT.   No signs of CAD and had normal cath in 2015 and recent cardiology visit.  Asked him to take his Coreg twice daily regularly (was only taking daily) Maybe need to go up on this if his blood pressure continues to be up.

## 2015-08-10 ENCOUNTER — Ambulatory Visit: Payer: BLUE CROSS/BLUE SHIELD | Admitting: Cardiology

## 2015-09-09 ENCOUNTER — Telehealth: Payer: Self-pay | Admitting: Family Medicine

## 2015-09-09 MED ORDER — OSELTAMIVIR PHOSPHATE 75 MG PO CAPS: 75.0000 mg | ORAL_CAPSULE | Freq: Two times a day (BID) | ORAL | Status: DC

## 2015-09-09 NOTE — Telephone Encounter (Signed)
Wife just dx as flu.  He is having new onset of chills and fever Will call in tamiflu

## 2015-09-20 ENCOUNTER — Other Ambulatory Visit: Payer: Self-pay | Admitting: Orthopedic Surgery

## 2015-09-24 ENCOUNTER — Other Ambulatory Visit: Payer: Self-pay | Admitting: *Deleted

## 2015-09-25 NOTE — Pre-Procedure Instructions (Signed)
Alphia MohWilliam C Aydelotte  09/25/2015     Your procedure is scheduled on April 17.  [facility] at 10:30 A.M.  Call this number if you have problems the morning of surgery:  [phone]   Remember:  Do not eat food or drink liquids after midnight.  Take these medicines the morning of surgery with A SIP OF WATER Carvedilol, Pantoprazole   STOP Zinc, Multiple Vitamins, Vitamin D, Calcium, today   STOP/ Do not take Aspirin, Aleve, Naproxen, Advil, Ibuprofen, Motrin, Vitamins, Herbs, or Supplements starting today   Do not wear jewelry, make-up or nail polish.  Do not wear lotions, powders, or perfumes.  You may wear deodorant.  Do not shave 48 hours prior to surgery.  Men may shave face and neck.  Do not bring valuables to the hospital.  [facility] is not responsible for any belongings or valuables.  Contacts, dentures or bridgework may not be worn into surgery.  Leave your suitcase in the car.  After surgery it may be brought to your room.  For patients admitted to the hospital, discharge time will be determined by your treatment team.  Patients discharged the day of surgery will not be allowed to drive home.   [facility] - Preparing for Surgery  Before surgery, you can play an important role.  Because skin is not sterile, your skin needs to be as free of germs as possible.  You can reduce the number of germs on you skin by washing with CHG (chlorahexidine gluconate) soap before surgery.  CHG is an antiseptic cleaner which kills germs and bonds with the skin to continue killing germs even after washing.  Please DO NOT use if you have an allergy to CHG or antibacterial soaps.  If your skin becomes reddened/irritated stop using the CHG and inform your nurse when you arrive at Short Stay.  Do not shave (including legs and underarms) for at least 48 hours prior to the first CHG shower.  You may shave your face.  Please follow these instructions  carefully:   1.  Shower with CHG Soap the night before surgery and the morning of Surgery.  2.  If you choose to wash your hair, wash your hair first as usual with your normal shampoo.  3.  After you shampoo, rinse your hair and body thoroughly to remove the shampoo.  4.  Use CHG as you would any other liquid soap.  You can apply CHG directly to the skin and wash gently with scrungie or a clean washcloth.  5.  Apply the CHG Soap to your body ONLY FROM THE NECK DOWN.  Do not use on open wounds or open sores.  Avoid contact with your eyes, ears, mouth and genitals (private parts).  Wash genitals (private parts) with your normal soap.  6.  Wash thoroughly, paying special attention to the area where your surgery will be performed.  7.  Thoroughly rinse your body with warm water from the neck down.  8.  DO NOT shower/wash with your normal soap after using and rinsing off the CHG Soap.  9.  Pat yourself dry with a clean towel.            10.  Wear clean pajamas.            11.  Place clean sheets on your bed the night of your first shower and do not sleep with pets.  Day of Surgery  Do not apply  any lotions the morning of surgery.  Please wear clean clothes to the hospital/surgery center.  Please read over the following fact sheets that you were given. Pain Booklet, Coughing and Deep Breathing and Surgical Site Infection Prevention

## 2015-09-27 ENCOUNTER — Encounter (HOSPITAL_COMMUNITY): Payer: Self-pay

## 2015-09-27 ENCOUNTER — Encounter (HOSPITAL_COMMUNITY)
Admission: RE | Admit: 2015-09-27 | Discharge: 2015-09-27 | Disposition: A | Discharge status: Home / Self Care | Payer: BLUE CROSS/BLUE SHIELD | Source: Ambulatory Visit | Attending: Orthopedic Surgery | Admitting: Orthopedic Surgery

## 2015-09-27 DIAGNOSIS — Z01812 Encounter for preprocedural laboratory examination: Secondary | ICD-10-CM | POA: Diagnosis not present

## 2015-09-27 DIAGNOSIS — S83242A Other tear of medial meniscus, current injury, left knee, initial encounter: Secondary | ICD-10-CM | POA: Diagnosis not present

## 2015-09-27 DIAGNOSIS — X58XXXA Exposure to other specified factors, initial encounter: Secondary | ICD-10-CM | POA: Insufficient documentation

## 2015-09-27 DIAGNOSIS — M94262 Chondromalacia, left knee: Secondary | ICD-10-CM | POA: Insufficient documentation

## 2015-09-27 HISTORY — DX: Polyneuropathy, unspecified: G62.9

## 2015-09-27 HISTORY — DX: Unspecified osteoarthritis, unspecified site: M19.90

## 2015-09-27 LAB — SURGICAL PCR SCREEN
MRSA, PCR: NEGATIVE
Staphylococcus aureus: NEGATIVE

## 2015-09-27 LAB — BASIC METABOLIC PANEL
ANION GAP: 10 (ref 5–15)
BUN: 10 mg/dL (ref 6–20)
CO2: 25 mmol/L (ref 22–32)
Calcium: 9.2 mg/dL (ref 8.9–10.3)
Chloride: 106 mmol/L (ref 101–111)
Creatinine, Ser: 0.84 mg/dL (ref 0.61–1.24)
GFR calc Af Amer: >60 mL/min (ref >60)
GFR calc non Af Amer: >60 mL/min (ref >60)
GLUCOSE: 90 mg/dL (ref 65–99)
POTASSIUM: 3.8 mmol/L (ref 3.5–5.1)
Sodium: 141 mmol/L (ref 135–145)

## 2015-09-27 LAB — CBC
HCT: 43.4 % (ref 39.0–52.0)
HEMOGLOBIN: 14.2 g/dL (ref 13.0–17.0)
MCH: 30 pg (ref 26.0–34.0)
MCHC: 32.7 g/dL (ref 30.0–36.0)
MCV: 91.6 fL (ref 78.0–100.0)
PLATELETS: 277 10*3/uL (ref 150–400)
RBC: 4.74 MIL/uL (ref 4.22–5.81)
RDW: 13.7 % (ref 11.5–15.5)
WBC: 7.6 10*3/uL (ref 4.0–10.5)

## 2015-09-27 MED ORDER — ALLOPURINOL 300 MG PO TABS: 300.0000 mg | ORAL_TABLET | Freq: Every morning | ORAL | Status: DC

## 2015-10-01 MED ORDER — CEFAZOLIN SODIUM 10 G IJ SOLR
3.0000 g | INTRAMUSCULAR | Status: AC
  Administered 2015-10-04: 3 g via INTRAVENOUS
  Filled 2015-10-01: qty 3000

## 2015-10-04 ENCOUNTER — Ambulatory Visit (HOSPITAL_COMMUNITY): Payer: BLUE CROSS/BLUE SHIELD | Admitting: Anesthesiology

## 2015-10-04 ENCOUNTER — Ambulatory Visit (HOSPITAL_COMMUNITY)
Admission: RE | Admit: 2015-10-04 | Discharge: 2015-10-04 | Disposition: A | Discharge status: Home / Self Care | Payer: BLUE CROSS/BLUE SHIELD | Source: Ambulatory Visit | Attending: Orthopedic Surgery | Admitting: Orthopedic Surgery

## 2015-10-04 ENCOUNTER — Encounter (HOSPITAL_COMMUNITY)
Admission: RE | Disposition: A | Discharge status: Home / Self Care | Payer: Self-pay | Source: Ambulatory Visit | Attending: Orthopedic Surgery

## 2015-10-04 ENCOUNTER — Encounter (HOSPITAL_COMMUNITY): Payer: Self-pay | Admitting: *Deleted

## 2015-10-04 DIAGNOSIS — E669 Obesity, unspecified: Secondary | ICD-10-CM | POA: Insufficient documentation

## 2015-10-04 DIAGNOSIS — M2392 Unspecified internal derangement of left knee: Secondary | ICD-10-CM | POA: Diagnosis not present

## 2015-10-04 DIAGNOSIS — I10 Essential (primary) hypertension: Secondary | ICD-10-CM | POA: Insufficient documentation

## 2015-10-04 DIAGNOSIS — M222X2 Patellofemoral disorders, left knee: Secondary | ICD-10-CM | POA: Diagnosis not present

## 2015-10-04 DIAGNOSIS — M6752 Plica syndrome, left knee: Secondary | ICD-10-CM | POA: Insufficient documentation

## 2015-10-04 DIAGNOSIS — G629 Polyneuropathy, unspecified: Secondary | ICD-10-CM | POA: Insufficient documentation

## 2015-10-04 DIAGNOSIS — M2242 Chondromalacia patellae, left knee: Secondary | ICD-10-CM | POA: Diagnosis present

## 2015-10-04 DIAGNOSIS — Z79899 Other long term (current) drug therapy: Secondary | ICD-10-CM | POA: Diagnosis not present

## 2015-10-04 DIAGNOSIS — M25562 Pain in left knee: Secondary | ICD-10-CM | POA: Diagnosis present

## 2015-10-04 DIAGNOSIS — G4733 Obstructive sleep apnea (adult) (pediatric): Secondary | ICD-10-CM | POA: Insufficient documentation

## 2015-10-04 DIAGNOSIS — M228X2 Other disorders of patella, left knee: Secondary | ICD-10-CM | POA: Diagnosis not present

## 2015-10-04 HISTORY — PX: KNEE ARTHROSCOPY: SHX127

## 2015-10-04 SURGERY — ARTHROSCOPY, KNEE
Anesthesia: General | Site: Knee | Laterality: Left

## 2015-10-04 SURGERY — ARTHROSCOPY, KNEE
Anesthesia: Choice | Laterality: Left

## 2015-10-04 MED ORDER — SUCCINYLCHOLINE 20MG/ML (10ML) SYRINGE FOR MEDFUSION PUMP - OPTIME
INTRAMUSCULAR | Status: DC | PRN
  Administered 2015-10-04: 120 mg via INTRAVENOUS

## 2015-10-04 MED ORDER — MIDAZOLAM HCL 2 MG/2ML IJ SOLN
INTRAMUSCULAR | Status: AC
  Filled 2015-10-04: qty 2

## 2015-10-04 MED ORDER — KETOROLAC TROMETHAMINE 30 MG/ML IJ SOLN
30.0000 mg | Freq: Once | INTRAMUSCULAR | Status: AC
  Administered 2015-10-04: 30 mg via INTRAVENOUS

## 2015-10-04 MED ORDER — FENTANYL CITRATE (PF) 100 MCG/2ML IJ SOLN
25.0000 ug | INTRAMUSCULAR | Status: DC | PRN
  Administered 2015-10-04 (×3): 50 ug via INTRAVENOUS

## 2015-10-04 MED ORDER — LIDOCAINE HCL (CARDIAC) 20 MG/ML IV SOLN
INTRAVENOUS | Status: AC
  Filled 2015-10-04: qty 5

## 2015-10-04 MED ORDER — LACTATED RINGERS IV SOLN
INTRAVENOUS | Status: DC
  Administered 2015-10-04: 11:00:00 via INTRAVENOUS

## 2015-10-04 MED ORDER — OXYCODONE-ACETAMINOPHEN 10-325 MG PO TABS: 1.0000 | ORAL_TABLET | ORAL | Status: DC | PRN

## 2015-10-04 MED ORDER — CHLORHEXIDINE GLUCONATE 4 % EX LIQD: 60.0000 mL | Freq: Once | CUTANEOUS | Status: DC

## 2015-10-04 MED ORDER — SODIUM CHLORIDE 0.9 % IR SOLN
Status: DC | PRN
  Administered 2015-10-04: 1000 mL

## 2015-10-04 MED ORDER — HYDROCODONE-ACETAMINOPHEN 7.5-325 MG PO TABS
1.0000 | ORAL_TABLET | Freq: Once | ORAL | Status: AC | PRN
  Administered 2015-10-04: 1 via ORAL
  Filled 2015-10-04: qty 1

## 2015-10-04 MED ORDER — GLYCOPYRROLATE 0.2 MG/ML IJ SOLN
INTRAMUSCULAR | Status: AC
  Filled 2015-10-04: qty 3

## 2015-10-04 MED ORDER — FENTANYL CITRATE (PF) 250 MCG/5ML IJ SOLN
INTRAMUSCULAR | Status: AC
  Filled 2015-10-04: qty 5

## 2015-10-04 MED ORDER — ONDANSETRON HCL 4 MG/2ML IJ SOLN
INTRAMUSCULAR | Status: AC
  Filled 2015-10-04: qty 2

## 2015-10-04 MED ORDER — LIDOCAINE HCL (CARDIAC) 20 MG/ML IV SOLN
INTRAVENOUS | Status: DC | PRN
  Administered 2015-10-04: 100 mg via INTRAVENOUS

## 2015-10-04 MED ORDER — EPINEPHRINE HCL 1 MG/ML IJ SOLN
INTRAMUSCULAR | Status: DC | PRN
  Administered 2015-10-04: 1 mg via SUBCUTANEOUS

## 2015-10-04 MED ORDER — BUPIVACAINE HCL (PF) 0.25 % IJ SOLN
INTRAMUSCULAR | Status: DC | PRN
  Administered 2015-10-04: 30 mL

## 2015-10-04 MED ORDER — ROCURONIUM BROMIDE 50 MG/5ML IV SOLN
INTRAVENOUS | Status: AC
Start: 2015-10-04 — End: 2015-10-04
  Filled 2015-10-04: qty 1

## 2015-10-04 MED ORDER — MIDAZOLAM HCL 5 MG/5ML IJ SOLN
INTRAMUSCULAR | Status: DC | PRN
  Administered 2015-10-04: 1 mg via INTRAVENOUS

## 2015-10-04 MED ORDER — ONDANSETRON HCL 4 MG/2ML IJ SOLN
INTRAMUSCULAR | Status: DC | PRN
  Administered 2015-10-04: 4 mg via INTRAVENOUS

## 2015-10-04 MED ORDER — FENTANYL CITRATE (PF) 100 MCG/2ML IJ SOLN
INTRAMUSCULAR | Status: DC | PRN
Start: 2015-10-04 — End: 2015-10-04
  Administered 2015-10-04 (×2): 100 ug via INTRAVENOUS

## 2015-10-04 MED ORDER — PROPOFOL 10 MG/ML IV BOLUS
INTRAVENOUS | Status: DC | PRN
  Administered 2015-10-04: 200 mg via INTRAVENOUS
  Administered 2015-10-04: 40 mg via INTRAVENOUS

## 2015-10-04 MED ORDER — FENTANYL CITRATE (PF) 100 MCG/2ML IJ SOLN
INTRAMUSCULAR | Status: AC
  Filled 2015-10-04: qty 2

## 2015-10-04 MED ORDER — FENTANYL CITRATE (PF) 100 MCG/2ML IJ SOLN
INTRAMUSCULAR | Status: DC | PRN
Start: 2015-10-04 — End: 2015-10-04

## 2015-10-04 MED ORDER — ARTIFICIAL TEARS OP OINT
TOPICAL_OINTMENT | OPHTHALMIC | Status: AC
  Filled 2015-10-04: qty 3.5

## 2015-10-04 MED ORDER — EPINEPHRINE HCL 1 MG/ML IJ SOLN
INTRAMUSCULAR | Status: AC
  Filled 2015-10-04: qty 1

## 2015-10-04 MED ORDER — KETOROLAC TROMETHAMINE 30 MG/ML IJ SOLN
INTRAMUSCULAR | Status: AC
  Filled 2015-10-04: qty 1

## 2015-10-04 MED ORDER — PHENYLEPHRINE 40 MCG/ML (10ML) SYRINGE FOR IV PUSH (FOR BLOOD PRESSURE SUPPORT)
PREFILLED_SYRINGE | INTRAVENOUS | Status: AC
  Filled 2015-10-04: qty 10

## 2015-10-04 SURGICAL SUPPLY — 40 items
BANDAGE ELASTIC 6 VELCRO ST LF (GAUZE/BANDAGES/DRESSINGS) ×2 IMPLANT
BLADE CUDA 5.5 (BLADE) IMPLANT
BLADE CUTTER GATOR 3.5 (BLADE) IMPLANT
BLADE GREAT WHITE 4.2 (BLADE) ×2 IMPLANT
BNDG GAUZE ELAST 4 BULKY (GAUZE/BANDAGES/DRESSINGS) ×2 IMPLANT
CUFF TOURNIQUET SINGLE 34IN LL (TOURNIQUET CUFF) IMPLANT
CUFF TOURNIQUET SINGLE 44IN (TOURNIQUET CUFF) IMPLANT
DRAPE ARTHROSCOPY W/POUCH 114 (DRAPES) ×2 IMPLANT
DRAPE PROXIMA HALF (DRAPES) ×2 IMPLANT
DRAPE U-SHAPE 47X51 STRL (DRAPES) ×2 IMPLANT
DRSG EMULSION OIL 3X3 NADH (GAUZE/BANDAGES/DRESSINGS) ×2 IMPLANT
DRSG PAD ABDOMINAL 8X10 ST (GAUZE/BANDAGES/DRESSINGS) ×2 IMPLANT
DURAPREP 26ML APPLICATOR (WOUND CARE) ×2 IMPLANT
ELECT MENISCUS 165MM 90D (ELECTRODE) ×4 IMPLANT
FILTER STRAW FLUID ASPIR (MISCELLANEOUS) ×2 IMPLANT
GAUZE SPONGE 4X4 12PLY STRL (GAUZE/BANDAGES/DRESSINGS) ×2 IMPLANT
GLOVE BIOGEL PI IND STRL 8 (GLOVE) ×2 IMPLANT
GLOVE BIOGEL PI INDICATOR 8 (GLOVE) ×2
GLOVE ECLIPSE 7.5 STRL STRAW (GLOVE) ×4 IMPLANT
GOWN STRL REUS W/ TWL LRG LVL3 (GOWN DISPOSABLE) ×1 IMPLANT
GOWN STRL REUS W/ TWL XL LVL3 (GOWN DISPOSABLE) ×2 IMPLANT
GOWN STRL REUS W/TWL LRG LVL3 (GOWN DISPOSABLE) ×1
GOWN STRL REUS W/TWL XL LVL3 (GOWN DISPOSABLE) ×2
KIT BASIN OR (CUSTOM PROCEDURE TRAY) ×2 IMPLANT
KIT ROOM TURNOVER OR (KITS) ×2 IMPLANT
MENISECTOMY ELECTRODE ×4 IMPLANT
NEEDLE 18GX1X1/2 (RX/OR ONLY) (NEEDLE) ×2 IMPLANT
PACK ARTHROSCOPY DSU (CUSTOM PROCEDURE TRAY) ×2 IMPLANT
PAD ARMBOARD 7.5X6 YLW CONV (MISCELLANEOUS) ×4 IMPLANT
PAD CAST 4YDX4 CTTN HI CHSV (CAST SUPPLIES) IMPLANT
PADDING CAST COTTON 4X4 STRL (CAST SUPPLIES)
PADDING CAST COTTON 6X4 STRL (CAST SUPPLIES) ×4 IMPLANT
PENCIL BUTTON HOLSTER BLD 10FT (ELECTRODE) ×2 IMPLANT
SET ARTHROSCOPY TUBING (MISCELLANEOUS) ×1
SET ARTHROSCOPY TUBING LN (MISCELLANEOUS) ×1 IMPLANT
SUT ETHILON 4 0 PS 2 18 (SUTURE) ×2 IMPLANT
SYR 5ML LL (SYRINGE) ×2 IMPLANT
TOWEL OR 17X24 6PK STRL BLUE (TOWEL DISPOSABLE) ×2 IMPLANT
TOWEL OR 17X26 10 PK STRL BLUE (TOWEL DISPOSABLE) ×2 IMPLANT
WRAP KNEE MAXI GEL POST OP (GAUZE/BANDAGES/DRESSINGS) ×2 IMPLANT

## 2015-10-04 NOTE — Anesthesia Preprocedure Evaluation (Addendum)
Anesthesia Evaluation  Patient identified by MRN, date of birth, ID band Patient awake    Reviewed: Allergy & Precautions, NPO status , Patient's Chart, lab work & pertinent test results, reviewed documented beta blocker date and time   Airway Mallampati: III  TM Distance: >3 FB Neck ROM: Full    Dental  (+) Dental Advisory Given   Pulmonary sleep apnea and Continuous Positive Airway Pressure Ventilation ,    breath sounds clear to auscultation       Cardiovascular hypertension, Pt. on medications and Pt. on home beta blockers  Rhythm:Regular Rate:Normal     Neuro/Psych Anxiety Bipolar Disorder negative neurological ROS     GI/Hepatic negative GI ROS, Neg liver ROS,   Endo/Other  negative endocrine ROS  Renal/GU negative Renal ROS     Musculoskeletal  (+) Arthritis ,   Abdominal   Peds  Hematology negative hematology ROS (+)   Anesthesia Other Findings   Reproductive/Obstetrics                            Lab Results  Component Value Date   WBC 7.6 09/27/2015   HGB 14.2 09/27/2015   HCT 43.4 09/27/2015   MCV 91.6 09/27/2015   PLT 277 09/27/2015   Lab Results  Component Value Date   CREATININE 0.84 09/27/2015   BUN 10 09/27/2015   NA 141 09/27/2015   K 3.8 09/27/2015   CL 106 09/27/2015   CO2 25 09/27/2015    Anesthesia Physical Anesthesia Plan  ASA: III  Anesthesia Plan: General   Post-op Pain Management:    Induction: Intravenous  Airway Management Planned: Oral ETT  Additional Equipment:   Intra-op Plan:   Post-operative Plan: Extubation in OR  Informed Consent: I have reviewed the patients History and Physical, chart, labs and discussed the procedure including the risks, benefits and alternatives for the proposed anesthesia with the patient or authorized representative who has indicated his/her understanding and acceptance.     Plan Discussed with:    Anesthesia Plan Comments:         Anesthesia Quick Evaluation

## 2015-10-04 NOTE — Transfer of Care (Signed)
Immediate Anesthesia Transfer of Care Note  Patient: Justin [name]  Procedure(s) Performed: Procedure(s): LEFT KNEE ARTHROSCOPY WITH LATERAL RETINACULAR RELEASE MEDIAL PLICA INCISION (Left)  Patient Location: PACU  Anesthesia Type:General  Level of Consciousness: awake, alert , oriented and patient cooperative  Airway & Oxygen Therapy: Patient Spontanous Breathing and Patient connected to face mask oxygen  Post-op Assessment: Report given to RN and Post -op Vital signs reviewed and stable  Post vital signs: Reviewed and stable  Last Vitals:  Filed Vitals:   10/04/15 1024  BP: 137/80  Pulse: 61  Temp: 36.7 C  Resp: 20    Complications: No apparent anesthesia complications

## 2015-10-04 NOTE — Anesthesia Procedure Notes (Addendum)
Procedure Name: Intubation Date/Time: 10/04/2015 12:45 PM Performed by: Darcey NoraJAMES, Gabbi Whetstone B Pre-anesthesia Checklist: Patient identified, Emergency Drugs available, Suction available, Patient being monitored and Timeout performed Patient Re-evaluated:Patient Re-evaluated prior to inductionOxygen Delivery Method: Circle system utilized Preoxygenation: Pre-oxygenation with 100% oxygen Intubation Type: IV induction Ventilation: Mask ventilation without difficulty Laryngoscope Size: Mac and 4 Grade View: Grade I Tube type: Oral Tube size: 7.5 mm Number of attempts: 1 Airway Equipment and Method: Stylet Placement Confirmation: ETT inserted through vocal cords under direct vision,  positive ETCO2 and breath sounds checked- equal and bilateral Secured at: 22 (cm at teeth) cm Tube secured with: Tape Dental Injury: Teeth and Oropharynx as per pre-operative assessment

## 2015-10-04 NOTE — Discharge Instructions (Signed)
POST-OP KNEE ARTHROSCOPY INSTRUCTIONS  °Dr. John Graves/Jim Domitila Stetler PA-C ° °Pain °You will be expected to have a moderate amount of pain in the affected knee for approximately two weeks. However, the first two days will be the most severe pain. A prescription has been provided to take as needed for the pain. The pain can be reduced by applying ice packs to the knee for the first 1-2 weeks post surgery. Also, keeping the leg elevated on pillows will help alleviate the pain. If you develop any acute pain or swelling in your calf muscle, please call the doctor. ° °Activity °It is preferred that you stay at bed rest for approximately 24 hours. However, you may go to the bathroom with help. Weight bearing as tolerated. You may begin the knee exercises the day of surgery. Discontinue crutches as the knee pain resolves. ° °Dressing °Keep the dressing dry. If the ace bandage should wrinkle or roll up, this can be rewrapped to prevent ridges in the bandage. You may remove all dressings in 48 hours,  apply bandaids to each wound. You may shower on the 4th day after surgery but no tub bath. ° °Symptoms to report to your doctor °Extreme pain °Extreme swelling °Temperature above 101 degrees °Change in the feeling, color, or movement of your toes °Redness, heat, or swelling at your incision ° °Exercise °If is preferred that as soon as possible you try to do a straight leg raise without bending the knee and concentrate on bringing the heel of your foot off the bed up to approximately 45 degrees and hold for the count of 10 seconds. Repeat this at least 10 times three or four times per day. Additional exercises are provided below. ° °You are encouraged to bend the knee as tolerated. ° °Follow-Up °Call to schedule a follow-up appointment in 5-7 days. Our office # is [phone]. ° °POST-OP EXERCISES ° °Short Arc Quads ° °1. Lie on back with legs straight. Place towel roll under thigh, just above knee. °2. Tighten thigh muscles to  straighten knee and lift heel off bed. °3. Hold for slow count of five, then lower. °4. Do three sets of ten ° ° ° °Straight Leg Raises ° °1. Lie on back with operative leg straight and other leg bent. °2. Keeping operative leg completely straight, slowly lift operative leg so foot is 5 inches off bed. °3. Hold for slow count of five, then lower. °4. Do three sets of ten. ° ° ° °DO BOTH EXERCISES 2 TIMES A DAY ° °Ankle Pumps ° °Work/move the operative ankle and foot up and down 10 times every hour while awake. °

## 2015-10-04 NOTE — Brief Op Note (Signed)
10/04/2015  1:19 PM  PATIENT:  Alphia MohWilliam C Modeste  45 y.o. male  PRE-OPERATIVE DIAGNOSIS:  LEFT KNEE MEDIAL MENISCUS TEAR WTH CHONDROMALACIA  POST-OPERATIVE DIAGNOSIS:  LEFT KNEE LATERAL TIGHT RETINACULLUM AND MEDIAL PLICA  PROCEDURE:  Procedure(s): LEFT KNEE ARTHROSCOPY WITH LATERAL RETINACULAR RELEASE MEDIAL PLICA INCISION (Left)  SURGEON:  Surgeon(s) and Role:    * Jodi GeraldsJohn Alveta Quintela, MD - Primary  PHYSICIAN ASSISTANT:   ASSISTANTS: bethune   ANESTHESIA:   general  EBL:  Total I/O In: 800 [I.V.:800] Out: -   BLOOD ADMINISTERED:none  DRAINS: none   LOCAL MEDICATIONS USED:  MARCAINE     SPECIMEN:  No Specimen  DISPOSITION OF SPECIMEN:  N/A  COUNTS:  YES  TOURNIQUET:  * No tourniquets in log *  DICTATION: .Other Dictation: Dictation Number [id number]  PLAN OF CARE: Discharge to home after PACU  PATIENT DISPOSITION:  PACU - hemodynamically stable.   Delay start of Pharmacological VTE agent (>24hrs) due to surgical blood loss or risk of bleeding: no

## 2015-10-04 NOTE — H&P (Signed)
PREOPERATIVE H&P  Chief Complaint: Left knee pain  HPI: Justin [name] is a 45 y.o. male who presents for evaluation of Left knee pain. It has been present for Greater than 6 months and has been worsening. He has failed conservative measures. Pain is rated as moderate.  Past Medical History  Diagnosis Date  . Obese   . Hypertension   . OSA on CPAP     uses cpap, pt does not know settings  . Nephrolithiasis   . Kidney stones age 45 to [age]  . Bipolar disorder (HCC)      pt stated not bipolar  . Arthritis   . Neuropathy [facility](HCC)    Past Surgical History  Procedure Laterality Date  . Cardiac catheterization  08/2013    normal arteries/function  . Tooth extraction    . Gastric roux-en-y N/A 05/26/2014    Procedure: LAPAROSCOPIC ROUX-EN-Y GASTRIC BYPASS WITH UPPER ENDOSCOPY;  Surgeon: Atilano InaEric M Wilson, MD;  Location: [facility];  Service: General;  Laterality: N/A;  . Upper gi endoscopy  05/26/2014    Procedure: UPPER GI ENDOSCOPY;  Surgeon: Atilano InaEric M Wilson, MD;  Location: [facility];  Service: General;;  . Left heart catheterization with coronary angiogram N/A 09/17/2013    Procedure: LEFT HEART CATHETERIZATION WITH CORONARY ANGIOGRAM;  Surgeon: Peter M SwazilandJordan, MD;  [facility];  Service: Cardiovascular;  Laterality: N/A;   Social History   Social History  . Marital Status: Married    Spouse Name: N/A  . Number of Children: N/A  . Years of Education: N/A   Social History Main Topics  . Smoking status: Never Smoker   . Smokeless tobacco: Never Used  . Alcohol Use: No  . Drug Use: No  . Sexual Activity:    Partners: Female   Other Topics Concern  . None   Social History Narrative   Marital Status: Married      Children: 2, son with special needs born in 2005 and daughter born in 2007      Occupation: Education officer, environmentalastor / Runner, broadcasting/film/videoTeacher on internet      For fun - computer and gaming   Family History  Problem Relation Age of Onset  . Coronary artery disease Mother 9445    MI  . Diabetes Mother    . Stroke Mother     ~2012-Deceased   Allergies  Allergen Reactions  . Lamictal [Lamotrigine] Other (See Comments)    INCREASED LFTs  . Tegretol [Carbamazepine] Other (See Comments)    INCREASED LFTS  . Sulfonamide Derivatives Hives   Prior to Admission medications   Medication Sig Start Date End Date Taking? Authorizing Provider  allopurinol (ZYLOPRIM) 300 MG tablet Take 1 tablet (300 mg total) by mouth every morning. 09/27/15  Yes Carney LivingMarshall [name] Chambliss, MD  carvedilol (COREG) 3.125 MG tablet Take 1 tablet (3.125 mg total) by mouth 2 (two) times daily with a meal. 03/01/15  Yes Carney LivingMarshall [name] Chambliss, MD  Cholecalciferol (VITAMIN D PO) Take 2,000 Units by mouth every morning.    Yes Historical Provider, MD  gabapentin (NEURONTIN) 100 MG capsule 1 by mouth at night as needed may increase to 3 at night as needed Patient taking differently: Take 100-300 mg by mouth at bedtime. 1 by mouth at night as needed may increase to 3 at night as needed 08/09/15  Yes Carney LivingMarshall [name] Chambliss, MD  Multiple Vitamin (MULTIVITAMIN WITH MINERALS) TABS Take 2 tablets by mouth every morning.    Yes Historical Provider, MD  Zinc  50 MG TABS Take 1 tablet by mouth every morning.    Yes Historical Provider, MD  Calcium Citrate-Vitamin D 500-500 MG-UNIT CHEW Chew by mouth 3 (three) times daily.    Historical Provider, MD  oseltamivir (TAMIFLU) 75 MG capsule Take 1 capsule (75 mg total) by mouth 2 (two) times daily. Patient not taking: Reported on 09/23/2015 09/09/15   Carney Living, MD  oxyCODONE-acetaminophen (PERCOCET) 10-325 MG tablet Take 1 tablet by mouth every 4 (four) hours as needed for pain. Patient not taking: Reported on 09/23/2015 07/23/15   Max T Hyatt, DPM  pantoprazole (PROTONIX) 40 MG tablet Take 1 tablet (40 mg total) by mouth daily. 05/28/14   Gaynelle Adu, MD     Positive ROS: None  All other systems have been reviewed and were otherwise negative with the exception of those mentioned in the HPI and as  above.  Physical Exam: Filed Vitals:   10/04/15 1024  BP: 137/80  Pulse: 61  Temp: 98 F (36.7 C)  Resp: 20    General: Alert, no acute distress Cardiovascular: No pedal edema Respiratory: No cyanosis, no use of accessory musculature GI: No organomegaly, abdomen is soft and non-tender Skin: No lesions in the area of chief complaint Neurologic: Sensation intact distally Psychiatric: Patient is competent for consent with normal mood and affect Lymphatic: No axillary or cervical lymphadenopathy  MUSCULOSKELETAL: Left knee: Painful range of motion.  Trace effusion.  No instability.  Mild pain to range of motion.  Assessment/Plan: LEFT KNEE MEDIAL MENISCUS TEAR WTH CHONDROMALACIA Plan for Procedure(s): LEFT KNEE ARTHROSCOPY WITH MENISCUS REPAIR  The risks benefits and alternatives were discussed with the patient including but not limited to the risks of nonoperative treatment, versus surgical intervention including infection, bleeding, nerve injury, malunion, nonunion, hardware prominence, hardware failure, need for hardware removal, blood clots, cardiopulmonary complications, morbidity, mortality, among others, and they were willing to proceed.  Predicted outcome is good, although there will be at least a six to nine month expected recovery.  Justin Flott L, MD 10/04/2015 11:55 AM

## 2015-10-05 ENCOUNTER — Encounter (HOSPITAL_COMMUNITY): Payer: Self-pay | Admitting: Orthopedic Surgery

## 2015-10-05 NOTE — Anesthesia Postprocedure Evaluation (Signed)
Anesthesia Post Note  Patient: Justin MohWilliam C [name]  Procedure(s) Performed: Procedure(s) (LRB): LEFT KNEE ARTHROSCOPY WITH LATERAL RETINACULAR RELEASE MEDIAL PLICA INCISION (Left)  Patient location during evaluation: PACU Anesthesia Type: General Level of consciousness: awake and alert Pain management: pain level controlled Vital Signs Assessment: post-procedure vital signs reviewed and stable Respiratory status: spontaneous breathing, nonlabored ventilation and respiratory function stable Cardiovascular status: blood pressure returned to baseline and stable Postop Assessment: no signs of nausea or vomiting Anesthetic complications: no    Last Vitals:  Filed Vitals:   10/04/15 1430 10/04/15 1445  BP: 137/92 145/95  Pulse: 66 60  Temp:    Resp: 14 19    Last Pain:  Filed Vitals:   10/04/15 1503  PainSc: 4                  Kennieth RadFitzgerald, Chene Kasinger E

## 2015-10-05 NOTE — Op Note (Signed)
NAME:  Justin [name], [place]               ACCOUNT NO.:  [id number]  MEDICAL RECORD NO.:  [id number]  LOCATION:  [facility]                         FACILITY:  [facility]  PHYSICIAN:  Harvie JuniorJohn L. Xhaiden Coombs, M.D.   DATE OF BIRTH:  1971-06-08  DATE OF PROCEDURE:  10/04/2015 DATE OF DISCHARGE:  10/04/2015                              OPERATIVE REPORT   PREOPERATIVE DIAGNOSIS:  Left knee pain having failed conservative care with concerns for chondral injury.  POSTOPERATIVE DIAGNOSES: 1. Severe lateral maltracking. 2. Large medial shelf plica.  PROCEDURES: 1. Lateral retinacular release, arthroscopic. 2. Medial shelf plica excision.  SURGEON:  Harvie JuniorJohn L. Annabell Oconnor, M.D.  ASSISTANT:  Marshia LyJames Bethune, P.A.  ANESTHESIA:  General.  BRIEF HISTORY:  Mr. [name] is a [age] year old male with a long history of significant complaints of left knee pain.  He has had a long history of conservative care including weight loss, activity modification and injection therapy.  After failure of all conservative care, MRI was obtained, which showed no internal derangement, but he was still having significant complaints of pain and after failure of conservative care, there was concerns for possible chondral injury versus occult meniscal injury.  He was brought to the operating room for evaluation and fixation as needed.  DESCRIPTION OF PROCEDURE:  The patient was brought to the operative room.  After adequate anesthesia was obtained with general anesthetic, the patient was placed supine on the operating room table.  The left leg was prepped and draped in usual sterile fashion.  Following this, routine arthroscopic examination of the knee revealed there was dramatic lateral maltracking where the patella was essentially riding only over the lateral femoral condyle.  There was no portion of ridding in the groove really at any level of flexion.  At this point, we went down in the medial compartment, there was a large medial shelf plica  draping the medial compartment, this was debrided back to the rim and this while we went around centrally, this gave access to the medial compartment now, which had no meniscal tear and the medial femoral condyle was normal. We went to the lateral compartment where the lateral meniscus was evaluated and the lateral femoral condyle normal.  Back up to the patellofemoral joint where there was dramatic lateral tracking, put a spinal needle just about 3 fingerbreadths proximal to the patella on the lateral area and the lateral retinaculum was released from this area down to the joint line.  This improved the lateral tracking of the patella dramatically.  Once this was done, the knee was copiously irrigated and suctioned dry.  The 20 mL of 0.25% Marcaine was instilled in the knee for postoperative anesthesia.  Sterile compressive dressing was applied.  The patient was taken to the recovery room, noted to be in satisfactory condition. Estimated blood loss for the procedure was minimal.     Harvie JuniorJohn L. Machael Raine, M.D.     Ranae PlumberJLG/MEDQ  D:  10/04/2015  T:  10/05/2015  Job:  [id number]

## 2015-10-11 DIAGNOSIS — M25662 Stiffness of left knee, not elsewhere classified: Secondary | ICD-10-CM | POA: Diagnosis not present

## 2015-10-11 DIAGNOSIS — R262 Difficulty in walking, not elsewhere classified: Secondary | ICD-10-CM | POA: Diagnosis not present

## 2015-10-11 DIAGNOSIS — M25462 Effusion, left knee: Secondary | ICD-10-CM | POA: Diagnosis not present

## 2015-10-11 DIAGNOSIS — M25562 Pain in left knee: Secondary | ICD-10-CM | POA: Diagnosis not present

## 2015-10-19 ENCOUNTER — Ambulatory Visit: Payer: BLUE CROSS/BLUE SHIELD | Admitting: Dietician

## 2015-10-19 DIAGNOSIS — M25562 Pain in left knee: Secondary | ICD-10-CM | POA: Diagnosis not present

## 2015-10-19 DIAGNOSIS — R262 Difficulty in walking, not elsewhere classified: Secondary | ICD-10-CM | POA: Diagnosis not present

## 2015-10-19 DIAGNOSIS — M25662 Stiffness of left knee, not elsewhere classified: Secondary | ICD-10-CM | POA: Diagnosis not present

## 2015-10-25 DIAGNOSIS — M25562 Pain in left knee: Secondary | ICD-10-CM | POA: Diagnosis not present

## 2015-10-25 DIAGNOSIS — M25662 Stiffness of left knee, not elsewhere classified: Secondary | ICD-10-CM | POA: Diagnosis not present

## 2015-10-25 DIAGNOSIS — R262 Difficulty in walking, not elsewhere classified: Secondary | ICD-10-CM | POA: Diagnosis not present

## 2015-10-27 DIAGNOSIS — M25562 Pain in left knee: Secondary | ICD-10-CM | POA: Diagnosis not present

## 2015-11-08 ENCOUNTER — Ambulatory Visit (INDEPENDENT_AMBULATORY_CARE_PROVIDER_SITE_OTHER): Payer: BLUE CROSS/BLUE SHIELD | Admitting: Family Medicine

## 2015-11-08 ENCOUNTER — Ambulatory Visit (HOSPITAL_COMMUNITY)
Admission: RE | Admit: 2015-11-08 | Discharge: 2015-11-08 | Disposition: A | Discharge status: Home / Self Care | Payer: BLUE CROSS/BLUE SHIELD | Source: Ambulatory Visit | Attending: Family Medicine | Admitting: Family Medicine

## 2015-11-08 ENCOUNTER — Encounter: Payer: Self-pay | Admitting: Family Medicine

## 2015-11-08 VITALS — BP 153/96 | HR 71 | Temp 97.9°F | Ht 69.0 in | Wt 336.9 lb

## 2015-11-08 DIAGNOSIS — M542 Cervicalgia: Secondary | ICD-10-CM | POA: Insufficient documentation

## 2015-11-08 DIAGNOSIS — M6248 Contracture of muscle, other site: Secondary | ICD-10-CM | POA: Diagnosis not present

## 2015-11-08 DIAGNOSIS — M62838 Other muscle spasm: Secondary | ICD-10-CM

## 2015-11-08 MED ORDER — CYCLOBENZAPRINE HCL 10 MG PO TABS: 5.0000 mg | ORAL_TABLET | Freq: Three times a day (TID) | ORAL | Status: DC | PRN

## 2015-11-08 NOTE — Progress Notes (Signed)
Subjective:    Patient ID: Justin [name], male    DOB: 1970-12-12, 45 y.o.   MRN: [id number]  Justin [name] is a 45 y.o. male presenting on 11/08/2015 for Neck Pain   Patient presents for a same day appointment.   HPI   ACUTE NECK PAIN: - Reports he woke up 2 days ago on Saturday with sharp pains in upper neck near base of his skull without other headache, tried Tylenol x 2 tabs in AM and PM with only mild relief, improvement if head is tilted forward slightly. 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 Persistent symptoms without improvement or significant worsening for past 3 days. - No history of similar neck or head pains. - Known family history of blood clots and brain aneurysms, mother passed from brain aneurysm around age 43. Patient has never had a DVT/PE. - History of neuropathy in left thigh, takes gabapentin. Unable to take NSAIDs s/p gastric bypass - Denies any frontal headache, vision or hearing changes, trauma or injury / MVC, nausea, vomiting, focal numbness, tingling, weakness   Social History  Substance Use Topics  . Smoking status: Never Smoker   . Smokeless tobacco: Never Used  . Alcohol Use: No    Review of Systems Per HPI unless specifically indicated above     Objective:    BP 153/96 mmHg  Pulse 71  Temp(Src) 97.9 F (36.6 C) (Oral)  Ht 5\' 9"  (1.753 m)  Wt 336 lb 14.4 oz (152.817 kg)  BMI 49.73 kg/m2  Wt Readings from Last 3 Encounters:  11/08/15 336 lb 14.4 oz (152.817 kg)  10/04/15 340 lb (154.223 kg)  09/27/15 340 lb 3.2 oz (154.314 kg)    Physical Exam  Constitutional: He is oriented to person, place, and time. He appears well-developed and well-nourished. No distress.  Obese, Well-appearing, mild discomfort with neck pain otherwise cooperative  HENT:  Head: Normocephalic and atraumatic.  Mouth/Throat: Oropharynx is clear and moist.  No sinus tenderness to palpation. No  evidence of trauma or ecchymosis. Patent nares. Oropharynx clear.  Eyes: Conjunctivae and EOM are normal. Pupils are equal, round, and reactive to light.  Neck: Normal range of motion. Neck supple. No thyromegaly present.  Occipital C-spine junction generalized mild-mod tenderness on palpation, increased over midline bony upper C-spine, some tenderness over paraspinal C-spine muscles with notable hypertonicity and spasm.  Able to fully extend and flex neck. Without rigidity or stiffness.  Cardiovascular: Normal rate and intact distal pulses.   No murmur heard. Pulmonary/Chest: Effort normal.  Musculoskeletal: He exhibits no edema.  Muscle strength 5/5 bilateral upper ext grip, biceps, lower ext knees, ankles. Gait normal.  Lymphadenopathy:    He has no cervical adenopathy.  Neurological: He is alert and oriented to person, place, and time.  Cranial nerves II-XII grossly intact and symmetrical, distal sensation to light touch intact upper ext and lower ext.  Skin: Skin is warm and dry. No rash noted. He is not diaphoretic.  Nursing note and vitals reviewed.      Assessment & Plan:   Problem List Items Addressed This Visit    None    Visit Diagnoses    Neck pain, acute    -  Primary    Relevant Medications    cyclobenzaprine (FLEXERIL) 10 MG tablet    Other Relevant Orders    DG Cervical Spine Complete (Completed)    Neck muscle spasm  Relevant Medications    cyclobenzaprine (FLEXERIL) 10 MG tablet       Persistent upper bilateral cervical paraspinal muscle spasm without known mechanism of injury, not consistent with headache (near occiput but not quite headache), no other associated neurological symptoms. Without complications. No evidence of radicular symptoms or neurological deficits or weakness. Concern given family history of aneurysm however, current symptoms not suggestive of aneurysm or intracranial process. - No prior C-spine imaging, but h/o OA/DJD in other areas -  Inadequate conservative treatments at home  Plan: 1. Ordered C-spine X-ray series - Personally reviewed films and interpretation results without evidence of acute fracture or subluxation, but identified mild DJD in region of C1-C2 (consistent with location of pain) also with some mild disc space flattening and ant spurring C6-C7 and C4. - Notified patient by phone after visit with above X-ray results, likely etiology of pain 2. Rx Flexeril 5-10mg  TID PRN for spasms and neck pain 3. Tylenol PRN 4. Heating pad / moist heat 5. Close follow-up within 48-72 hr if not improving, strict return criteria given when to go to ED - May need future PT if not improved    Meds ordered this encounter  Medications  . cyclobenzaprine (FLEXERIL) 10 MG tablet    Sig: Take 0.5-1 tablets (5-10 mg total) by mouth 3 (three) times daily as needed for muscle spasms.    Dispense:  30 tablet    Refill:  0      Follow up plan: Return in about 3 days (around 11/11/2015), or if symptoms worsen or fail to improve, for if worsening neck pain.   Saralyn Pilar, DO [facility], PGY-3

## 2015-11-08 NOTE — Patient Instructions (Signed)
Thank you for coming in to clinic today.  1. For your Headaches / Neck pain - Overall it seems reassuring. I don't see any findings on exam that are concerning for serious head injury or neurological injury. 2. Start Flexeril 10mg  (muscle relaxant) - use half or whole tablet up to 3 times daily (may make you drowsy) 3. Increase Tylenol to 1000mg  up to 3 times daily for breakthrough pain for 3-5 days then only as needed 4. Use moist heat or heating pad on shoulders / neck, and have family member help with soft tissue massage  Ordered Neck X-rays - [facility] 1[facility] department for X-rays, walk in, no appointment, you can get these done at any time during the day, Mon thru Friday  Please schedule a follow-up appointment with Dr Deirdre Priesthambliss or any available provider within 2-3 days ONLY IF NOT improving, otherwise can follow-up within 2 to 4 weeks if gradually improving.  If develop severe headaches, nausea / vomiting, loss of vision, numbness, weakness or tingling - call 911 or go immediately to ED.  If you have any other questions or concerns, please feel free to call the clinic to contact me. You may also schedule an earlier appointment if necessary.  However, if your symptoms get significantly worse, please go to the Emergency Department to seek immediate medical attention.  Saralyn PilarAlexander Euel Castile, DO [facility]

## 2015-11-13 DIAGNOSIS — M542 Cervicalgia: Secondary | ICD-10-CM | POA: Diagnosis not present

## 2015-11-20 ENCOUNTER — Encounter (HOSPITAL_COMMUNITY): Payer: Self-pay | Admitting: Emergency Medicine

## 2015-11-20 ENCOUNTER — Emergency Department (HOSPITAL_COMMUNITY)
Admission: EM | Admit: 2015-11-20 | Discharge: 2015-11-20 | Disposition: A | Discharge status: Home / Self Care | Payer: BLUE CROSS/BLUE SHIELD | Attending: Emergency Medicine | Admitting: Emergency Medicine

## 2015-11-20 DIAGNOSIS — G4733 Obstructive sleep apnea (adult) (pediatric): Secondary | ICD-10-CM | POA: Diagnosis not present

## 2015-11-20 DIAGNOSIS — Z87442 Personal history of urinary calculi: Secondary | ICD-10-CM | POA: Diagnosis not present

## 2015-11-20 DIAGNOSIS — Z79899 Other long term (current) drug therapy: Secondary | ICD-10-CM | POA: Diagnosis not present

## 2015-11-20 DIAGNOSIS — M7981 Nontraumatic hematoma of soft tissue: Secondary | ICD-10-CM | POA: Diagnosis not present

## 2015-11-20 DIAGNOSIS — E669 Obesity, unspecified: Secondary | ICD-10-CM | POA: Insufficient documentation

## 2015-11-20 DIAGNOSIS — Z9981 Dependence on supplemental oxygen: Secondary | ICD-10-CM | POA: Insufficient documentation

## 2015-11-20 DIAGNOSIS — G629 Polyneuropathy, unspecified: Secondary | ICD-10-CM | POA: Insufficient documentation

## 2015-11-20 DIAGNOSIS — S8012XA Contusion of left lower leg, initial encounter: Secondary | ICD-10-CM

## 2015-11-20 DIAGNOSIS — Z9889 Other specified postprocedural states: Secondary | ICD-10-CM | POA: Insufficient documentation

## 2015-11-20 DIAGNOSIS — I10 Essential (primary) hypertension: Secondary | ICD-10-CM | POA: Diagnosis not present

## 2015-11-20 DIAGNOSIS — M1990 Unspecified osteoarthritis, unspecified site: Secondary | ICD-10-CM | POA: Diagnosis not present

## 2015-11-20 DIAGNOSIS — L03116 Cellulitis of left lower limb: Secondary | ICD-10-CM | POA: Diagnosis not present

## 2015-11-20 DIAGNOSIS — M79662 Pain in left lower leg: Secondary | ICD-10-CM | POA: Diagnosis not present

## 2015-11-20 LAB — COMPREHENSIVE METABOLIC PANEL
ALK PHOS: 84 U/L (ref 38–126)
ALT: 19 U/L (ref 17–63)
AST: 16 U/L (ref 15–41)
Albumin: 3.7 g/dL (ref 3.5–5.0)
Anion gap: 8 (ref 5–15)
BUN: 9 mg/dL (ref 6–20)
CALCIUM: 9 mg/dL (ref 8.9–10.3)
CHLORIDE: 105 mmol/L (ref 101–111)
CO2: 25 mmol/L (ref 22–32)
CREATININE: 0.88 mg/dL (ref 0.61–1.24)
GFR calc Af Amer: >60 mL/min (ref >60)
GFR calc non Af Amer: >60 mL/min (ref >60)
Glucose, Bld: 96 mg/dL (ref 65–99)
Potassium: 3.9 mmol/L (ref 3.5–5.1)
SODIUM: 138 mmol/L (ref 135–145)
Total Bilirubin: 0.7 mg/dL (ref 0.3–1.2)
Total Protein: 6.7 g/dL (ref 6.5–8.1)

## 2015-11-20 LAB — CBC WITH DIFFERENTIAL/PLATELET
BASOS ABS: 0.1 10*3/uL (ref 0.0–0.1)
Basophils Relative: 1 %
EOS ABS: 0.2 10*3/uL (ref 0.0–0.7)
EOS PCT: 2 %
HCT: 43.2 % (ref 39.0–52.0)
HEMOGLOBIN: 14 g/dL (ref 13.0–17.0)
LYMPHS ABS: 3.8 10*3/uL (ref 0.7–4.0)
LYMPHS PCT: 36 %
MCH: 30.1 pg (ref 26.0–34.0)
MCHC: 32.4 g/dL (ref 30.0–36.0)
MCV: 92.9 fL (ref 78.0–100.0)
Monocytes Absolute: 0.9 10*3/uL (ref 0.1–1.0)
Monocytes Relative: 8 %
NEUTROS PCT: 53 %
Neutro Abs: 5.7 10*3/uL (ref 1.7–7.7)
PLATELETS: 324 10*3/uL (ref 150–400)
RBC: 4.65 MIL/uL (ref 4.22–5.81)
RDW: 13.7 % (ref 11.5–15.5)
WBC: 10.7 10*3/uL — AB (ref 4.0–10.5)

## 2015-11-20 MED ORDER — DOXYCYCLINE HYCLATE 100 MG PO CAPS: 100.0000 mg | ORAL_CAPSULE | Freq: Two times a day (BID) | ORAL | Status: DC

## 2015-11-20 MED ORDER — CEPHALEXIN 250 MG PO CAPS
500.0000 mg | ORAL_CAPSULE | Freq: Once | ORAL | Status: AC
  Administered 2015-11-20: 500 mg via ORAL
  Filled 2015-11-20: qty 2

## 2015-11-20 MED ORDER — DOXYCYCLINE HYCLATE 100 MG PO TABS
100.0000 mg | ORAL_TABLET | Freq: Once | ORAL | Status: AC
  Administered 2015-11-20: 100 mg via ORAL
  Filled 2015-11-20: qty 1

## 2015-11-20 MED ORDER — CEPHALEXIN 500 MG PO CAPS: 500.0000 mg | ORAL_CAPSULE | Freq: Three times a day (TID) | ORAL | Status: DC

## 2015-11-20 NOTE — ED Notes (Signed)
Pt c/o pain swelling and redness to left lower leg,  No known injury.  Lower leg warm to touch

## 2015-11-20 NOTE — ED Notes (Signed)
EDP at bedside  

## 2015-11-20 NOTE — ED Provider Notes (Signed)
CSN: [id number]     Arrival date & time 11/20/15  2040 History   First MD Initiated Contact with Patient 11/20/15 2123     Chief Complaint  Patient presents with  . Leg Pain     (Consider location/radiation/quality/duration/timing/severity/associated sxs/prior Treatment) Patient is a 45 y.o. male presenting with leg pain. The history is provided by the patient.  Leg Pain Location:  Leg Time since incident:  2 days Injury: no   Leg location:  L lower leg Pain details:    Quality:  Aching   Radiates to:  Does not radiate   Severity:  Moderate   Onset quality:  Gradual   Timing:  Constant   Progression:  Worsening Chronicity:  New Prior injury to area:  No Relieved by:  Nothing Worsened by:  Nothing tried Ineffective treatments:  Elevation, ice and rest Associated symptoms: swelling   Associated symptoms: no fever and no muscle weakness     Past Medical History  Diagnosis Date  . Obese   . Hypertension   . OSA on CPAP     uses cpap, pt does not know settings  . Nephrolithiasis   . Kidney stones age 45 to [age]  . Bipolar disorder (HCC)      pt stated not bipolar  . Arthritis   . Neuropathy [facility](HCC)    Past Surgical History  Procedure Laterality Date  . Cardiac catheterization  08/2013    normal arteries/function  . Tooth extraction    . Gastric roux-en-y N/A 05/26/2014    Procedure: LAPAROSCOPIC ROUX-EN-Y GASTRIC BYPASS WITH UPPER ENDOSCOPY;  Surgeon: Atilano InaEric M Wilson, MD;  Location: [facility];  Service: General;  Laterality: N/A;  . Upper gi endoscopy  05/26/2014    Procedure: UPPER GI ENDOSCOPY;  Surgeon: Atilano InaEric M Wilson, MD;  Location: [facility];  Service: General;;  . Left heart catheterization with coronary angiogram N/A 09/17/2013    Procedure: LEFT HEART CATHETERIZATION WITH CORONARY ANGIOGRAM;  Surgeon: Peter M SwazilandJordan, MD;  [facility];  Service: Cardiovascular;  Laterality: N/A;  . Knee arthroscopy Left 10/04/2015    Procedure: LEFT KNEE ARTHROSCOPY WITH LATERAL  RETINACULAR RELEASE MEDIAL PLICA INCISION;  Surgeon: Jodi GeraldsJohn Graves, MD;  Location: [facility] OR;  Service: Orthopedics;  Laterality: Left;   Family History  Problem Relation Age of Onset  . Coronary artery disease Mother 6645    MI  . Diabetes Mother   . Stroke Mother     ~2012-Deceased   Social History  Substance Use Topics  . Smoking status: Never Smoker   . Smokeless tobacco: Never Used  . Alcohol Use: No    Review of Systems  Constitutional: Negative for fever.  All other systems reviewed and are negative.     Allergies  Lamictal; Tegretol; and Sulfonamide derivatives  Home Medications   Prior to Admission medications   Medication Sig Start Date End Date Taking? Authorizing Provider  allopurinol (ZYLOPRIM) 300 MG tablet Take 1 tablet (300 mg total) by mouth every morning. 09/27/15   Carney LivingMarshall L Chambliss, MD  Calcium Citrate-Vitamin D 500-500 MG-UNIT CHEW Chew by mouth 3 (three) times daily.    Historical Provider, MD  carvedilol (COREG) 3.125 MG tablet Take 1 tablet (3.125 mg total) by mouth 2 (two) times daily with a meal. 03/01/15   Carney LivingMarshall L Chambliss, MD  Cholecalciferol (VITAMIN D PO) Take 2,000 Units by mouth every morning.     Historical Provider, MD  cyclobenzaprine (FLEXERIL) 10 MG tablet Take 0.5-1 tablets (5-10 mg  total) by mouth 3 (three) times daily as needed for muscle spasms. 11/08/15   Smitty Cords, DO  gabapentin (NEURONTIN) 100 MG capsule 1 by mouth at night as needed may increase to 3 at night as needed Patient taking differently: Take 100-300 mg by mouth at bedtime. 1 by mouth at night as needed may increase to 3 at night as needed 08/09/15   Carney Living, MD  Multiple Vitamin (MULTIVITAMIN WITH MINERALS) TABS Take 2 tablets by mouth every morning.     Historical Provider, MD  oxyCODONE-acetaminophen (PERCOCET) 10-325 MG tablet Take 1-2 tablets by mouth every 4 (four) hours as needed for pain. Patient not taking: Reported on 11/08/2015 10/04/15    Marshia Ly, PA-C  pantoprazole (PROTONIX) 40 MG tablet Take 1 tablet (40 mg total) by mouth daily. 05/28/14   Gaynelle Adu, MD  Zinc 50 MG TABS Take 1 tablet by mouth every morning.     Historical Provider, MD   BP 140/88 mmHg  Pulse 94  Temp(Src) 98.5 F (36.9 C) (Oral)  Resp 16  Ht 5\' 9"  (1.753 m)  Wt 341 lb 1 oz (154.705 kg)  BMI 50.34 kg/m2  SpO2 98% Physical Exam  Constitutional: He is oriented to person, place, and time. He appears well-developed and well-nourished. No distress.  HENT:  Head: Normocephalic and atraumatic.  Eyes: Conjunctivae are normal.  Neck: Neck supple. No tracheal deviation present.  Cardiovascular: Normal rate and regular rhythm.   Pulmonary/Chest: Effort normal. No respiratory distress.  Abdominal: Soft. He exhibits no distension.  Musculoskeletal:  Left medial calf with swelling and non-circumferential swelling, overlying erythema, tender with no pain on passive or active range of motion of foot  Neurological: He is alert and oriented to person, place, and time.  Skin: Skin is warm and dry.  Psychiatric: He has a normal mood and affect.    ED Course  Procedures (including critical care time)  Emergency Focused Ultrasound Exam Limited Ultrasound of Lower Extremity for DVT  Performed and interpreted by Dr. Clydene Pugh Indication: Leg swelling Transverse views of left lower extremity are obtained in real time for the purposes of evaluation for deep venous thrombosis.  Findings: + collapsible popliteal vein Interpretation: no popliteal deep venous thrombosis Images archived electronically.  CPT Code:   [phone]  Emergency Focused Ultrasound Exam Limited Ultrasound of Soft Tissue   Performed and interpreted by Dr. Clydene Pugh Indication: evaluation for infection or foreign body Transverse and Sagittal views of left calf are obtained in real time for the purposes of evaluation of skin and underlying soft tissues.  Findings: no heterogeneous fluid  collection, minimal hyperemia/edema of surrounding tissue, large anechoic area overlying fascia Interpretation: no abscess, possible cellulitis, likely left calf hematoma Images archived electronically.  CPT Codes:   Lower extremity [phone]  Other soft tissue [id number]    Labs Review Labs Reviewed  CBC WITH DIFFERENTIAL/PLATELET - Abnormal; Notable for the following:    WBC 10.7 (*)    All other components within normal limits  COMPREHENSIVE METABOLIC PANEL    Imaging Review No results found. I have personally reviewed and evaluated these images and lab results as part of my medical decision-making.   EKG Interpretation None      MDM   Final diagnoses:  Hematoma of leg, left, initial encounter  Cellulitis of left lower extremity    45 y.o. male presents with mild pain starting recently over left calf that has progressed into swelling and some overlying erythema. No fevers or systemic  symptoms. Not circumferential. No DVT noted proximal to evident hematoma of calf likely from a small venous bleed in the area. No arterial flow noted on doppler. Do not feel Pt warrants anticoagulation currently. Requested Pt return tomorrow for formal DVT r/o. Recommended supportive care measures for hematoma. Keflex and doxy to double cover for cellulitis that may be developing with small skin imperfection that pre-dated hematoma as source of inoculation.     Lyndal Pulley, MD 11/21/15 [phone]

## 2015-11-20 NOTE — Discharge Instructions (Signed)

## 2015-11-21 ENCOUNTER — Ambulatory Visit (HOSPITAL_COMMUNITY)
Admission: RE | Admit: 2015-11-21 | Discharge: 2015-11-21 | Disposition: A | Discharge status: Home / Self Care | Payer: BLUE CROSS/BLUE SHIELD | Source: Ambulatory Visit | Attending: Emergency Medicine | Admitting: Emergency Medicine

## 2015-11-21 DIAGNOSIS — X58XXXA Exposure to other specified factors, initial encounter: Secondary | ICD-10-CM | POA: Insufficient documentation

## 2015-11-21 DIAGNOSIS — M7989 Other specified soft tissue disorders: Secondary | ICD-10-CM | POA: Diagnosis not present

## 2015-11-21 DIAGNOSIS — S8012XA Contusion of left lower leg, initial encounter: Secondary | ICD-10-CM | POA: Insufficient documentation

## 2015-11-21 NOTE — Progress Notes (Signed)
VASCULAR LAB PRELIMINARY  PRELIMINARY  PRELIMINARY  PRELIMINARY  Left lower extremity venous duplex completed.    Preliminary report:  There is no DVT or SVT noted in the left lower extremity.  There is a hematoma noted in the left proximal calf, etiology unknown.   Mekiah Cambridge, RVT 11/21/2015, 9:08 AM

## 2015-11-22 DIAGNOSIS — Z9889 Other specified postprocedural states: Secondary | ICD-10-CM | POA: Diagnosis not present

## 2015-11-22 DIAGNOSIS — L03116 Cellulitis of left lower limb: Secondary | ICD-10-CM | POA: Diagnosis not present

## 2015-11-24 ENCOUNTER — Other Ambulatory Visit: Payer: Self-pay | Admitting: Family Medicine

## 2015-11-24 MED ORDER — GABAPENTIN 100 MG PO CAPS: 100.0000 mg | ORAL_CAPSULE | Freq: Every day | ORAL | Status: DC

## 2015-11-26 ENCOUNTER — Ambulatory Visit: Payer: BLUE CROSS/BLUE SHIELD | Admitting: Dietician

## 2015-11-29 DIAGNOSIS — L03116 Cellulitis of left lower limb: Secondary | ICD-10-CM | POA: Diagnosis not present

## 2015-12-07 ENCOUNTER — Encounter: Payer: Self-pay | Admitting: Dietician

## 2015-12-07 ENCOUNTER — Encounter: Payer: BLUE CROSS/BLUE SHIELD | Attending: General Surgery | Admitting: Dietician

## 2015-12-07 DIAGNOSIS — Z029 Encounter for administrative examinations, unspecified: Secondary | ICD-10-CM | POA: Diagnosis not present

## 2015-12-07 NOTE — Progress Notes (Signed)
  Follow-up visit: 1.5 years Post-Operative RYGB Surgery  Medical Nutrition Therapy:  Appt start time: 1200 end time: 1220  Primary concerns today: Post-operative Bariatric Surgery Nutrition Management.  Justin [name] returns today having gained 11.6 lbs. He recently had a knee injury requiring surgery and has not been able to exercise. He states he is excited about his overall weight loss but would still like to lose another 50 pounds (goal: 280 lbs). Has been on antibiotics for cellulitis.   Surgery date: 05/26/2014 Surgery type: RYGB [facility] on 02/28/14 (highest weight 450 lbs per patient) Weight today: 336.6 lbs Weight change: 11.6 lbs gain Total weight loss: 113.4 lbs Weight loss goal: 280 lbs  Preferred Learning Style:   No preference indicated   Learning Readiness:   Ready  24-hr recall: B (AM): Atkins shake, sometimes with an egg (22g) Snk (AM): L (PM): 3-4 oz meat with vegetable or salad (21-28g) Snk (PM): P3 pack (13g) D (PM): 3-4 oz meat with vegetable or salad (21-28g) Snk (PM): P3 pack (13g)  Fluid intake: 64 oz per day mostly water with sugar free flavoring and a protein shake, Powerade zero, coffee with half and half and Splenda Estimated total protein intake: 90-105 g  Medications: see list Supplementation: taking  Using straws: No Drinking while eating: no Hair loss: yes  Carbonated beverages: No N/V/D/C: none Dumping syndrome: yes after drinking too fast and too soon after eating  Recent physical activity:  none  Progress Towards Goal(s):  In progress.    Nutritional Diagnosis:  [place]-3.3 Overweight/obesity related to past poor dietary habits and physical inactivity as evidenced by patient w/ recent RYGB surgery following dietary guidelines for continued weight loss.    Intervention:  Nutrition education/diet reinforcement  Teaching Method Utilized:  Visual Auditory Hands on  Barriers to learning/adherence to lifestyle change:  none  Demonstrated degree of understanding via:  Teach Back   Monitoring/Evaluation:  Dietary intake, exercise, and body weight. Follow up in 3 months.

## 2015-12-07 NOTE — Patient Instructions (Addendum)
Goals:  Get back into exercise routine (start with walking)  At least 3x a week as tolerated

## 2015-12-14 ENCOUNTER — Other Ambulatory Visit: Payer: Self-pay | Admitting: *Deleted

## 2015-12-15 MED ORDER — ALLOPURINOL 300 MG PO TABS: 300.0000 mg | ORAL_TABLET | Freq: Every morning | ORAL | Status: DC

## 2015-12-23 ENCOUNTER — Other Ambulatory Visit: Payer: Self-pay | Admitting: *Deleted

## 2015-12-23 MED ORDER — CARVEDILOL 3.125 MG PO TABS: 3.1250 mg | ORAL_TABLET | Freq: Two times a day (BID) | ORAL | Status: DC

## 2015-12-28 DIAGNOSIS — M25562 Pain in left knee: Secondary | ICD-10-CM | POA: Diagnosis not present

## 2016-01-04 ENCOUNTER — Encounter: Payer: Self-pay | Admitting: Podiatry

## 2016-01-04 ENCOUNTER — Ambulatory Visit (INDEPENDENT_AMBULATORY_CARE_PROVIDER_SITE_OTHER): Payer: BLUE CROSS/BLUE SHIELD | Admitting: Podiatry

## 2016-01-04 DIAGNOSIS — M7662 Achilles tendinitis, left leg: Secondary | ICD-10-CM | POA: Diagnosis not present

## 2016-01-04 DIAGNOSIS — M7661 Achilles tendinitis, right leg: Secondary | ICD-10-CM | POA: Diagnosis not present

## 2016-01-04 DIAGNOSIS — M722 Plantar fascial fibromatosis: Secondary | ICD-10-CM

## 2016-01-04 NOTE — Progress Notes (Signed)
He presents today with a proximally 6 weeks worth of bilateral plantar heel pain. States that the posterior aspect of the bilateral heels seem to be doing okay at this point. Feels this may have developed while working long hours.  Objective: Vital signs are stable alert and oriented 3. Pulses are palpable. Pain on palpation medial calcaneal tubercles bilateral.  Assessment: Plantar fasciitis bilateral.  Plan: I injected the bilateral heels today with Kenalog and local anesthetic.

## 2016-01-18 DIAGNOSIS — M25562 Pain in left knee: Secondary | ICD-10-CM | POA: Diagnosis not present

## 2016-01-20 DIAGNOSIS — Z9884 Bariatric surgery status: Secondary | ICD-10-CM | POA: Diagnosis not present

## 2016-01-26 ENCOUNTER — Ambulatory Visit (INDEPENDENT_AMBULATORY_CARE_PROVIDER_SITE_OTHER): Payer: BLUE CROSS/BLUE SHIELD | Admitting: Family Medicine

## 2016-01-26 ENCOUNTER — Encounter: Payer: Self-pay | Admitting: Family Medicine

## 2016-01-26 DIAGNOSIS — I10 Essential (primary) hypertension: Secondary | ICD-10-CM | POA: Diagnosis not present

## 2016-01-26 DIAGNOSIS — M79652 Pain in left thigh: Secondary | ICD-10-CM | POA: Diagnosis not present

## 2016-01-26 DIAGNOSIS — F411 Generalized anxiety disorder: Secondary | ICD-10-CM

## 2016-01-26 MED ORDER — BUSPIRONE HCL 7.5 MG PO TABS: 7.5000 mg | ORAL_TABLET | Freq: Two times a day (BID) | ORAL | 1 refills | Status: DC

## 2016-01-26 NOTE — Patient Instructions (Signed)
Good to see you today!  Thanks for coming in.  Start the buspar 7.5 mg twice a day for 2 weeks.  If not helping enough call me  For the leg pain try increasing the gabapentin to 2-3 tabs up to three times a day  Continue to work on weight loss that will improve the knees and the thigh pain  Come back in 3-4 weeks

## 2016-01-26 NOTE — Progress Notes (Signed)
Subjective  Patient is presenting with the following illnesses  Left Thigh pain Continues to bother him on and off.  Cant relate to any specific activity. Gabapentin helps some.  No LE weakness or incontience No GI or GU symptoms.  Does not radiate to back or down his leg - just anterior upper thigh  Anxiety Has had on and off for years.  Stressful work as a Optician, dispensingminister. Has taken prozac in past which helped some.  Was treated with lamictal and tegretol by a psychiatrist who saw him for weight loss surgery eval.  Had an allergicr reaction to them.  His father was helped by Buspar.  He can talk to his child with special needs counselor if he wishes No SI or HI   Chief Complaint noted Review of Symptoms - see HPI PMH - Smoking status noted.     Objective Vital Signs reviewed  Alert nad Heart - Regular rate and rhythm.  No murmurs, gallops or rubs.    Lungs:  Normal respiratory effort, chest expands symmetrically. Lungs are clear to auscultation, no crackles or wheezes. Able to walk on heels and toes and no SLR pain    Assessments/Plans  No problem-specific Assessment & Plan notes found for this encounter.   See Encounter view if individual problem A/Ps not visible See after visit summary for details of patient instuctions

## 2016-01-26 NOTE — Assessment & Plan Note (Signed)
Worsened.  Still think is consistent with  meuralgia paresthetica.  No sign of focal musculoskeletal  Disease or herniated disk.  Increase gabapentin

## 2016-01-26 NOTE — Assessment & Plan Note (Signed)
BP Readings from Last 3 Encounters:  01/26/16 (!) 158/98  11/20/15 140/88  11/08/15 (!) 153/96   Asked him to take Coreg twice a day regularly

## 2016-01-26 NOTE — Assessment & Plan Note (Signed)
Worsened.  Will start buspar and follow up.  He is not interested in a counselor just now.   GAD is 8

## 2016-01-31 ENCOUNTER — Other Ambulatory Visit: Payer: Self-pay | Admitting: Physician Assistant

## 2016-01-31 DIAGNOSIS — R1084 Generalized abdominal pain: Secondary | ICD-10-CM

## 2016-02-09 ENCOUNTER — Other Ambulatory Visit: Payer: Self-pay | Admitting: Physician Assistant

## 2016-02-09 ENCOUNTER — Ambulatory Visit
Admission: RE | Admit: 2016-02-09 | Discharge: 2016-02-09 | Disposition: A | Discharge status: Home / Self Care | Payer: BLUE CROSS/BLUE SHIELD | Source: Ambulatory Visit | Attending: Physician Assistant | Admitting: Physician Assistant

## 2016-02-09 DIAGNOSIS — R1084 Generalized abdominal pain: Secondary | ICD-10-CM

## 2016-02-09 DIAGNOSIS — K8020 Calculus of gallbladder without cholecystitis without obstruction: Secondary | ICD-10-CM | POA: Diagnosis not present

## 2016-02-15 ENCOUNTER — Ambulatory Visit: Payer: Self-pay | Admitting: General Surgery

## 2016-02-15 DIAGNOSIS — Z9884 Bariatric surgery status: Secondary | ICD-10-CM | POA: Diagnosis not present

## 2016-02-15 DIAGNOSIS — M79652 Pain in left thigh: Secondary | ICD-10-CM | POA: Diagnosis not present

## 2016-02-15 NOTE — H&P (Signed)
Kristine GarbeWilliam C. Chauvin 02/15/2016 3:24 PM Location: [facility] Patient #: [id number] DOB: 1970-12-26 Married / Language: English / Race: White Male  History of Present Illness Minerva Areola(Edia Pursifull M. Pearlie Lafosse MD; 02/15/2016 3:51 PM) Patient words: GB.  The patient is a 45 year old male presenting status-post bariatric surgery. He underwent laparoscopic Roux-en-Y gastric bypass on May 26 2014. I last saw him in November 2016. His weight at time was 350 pounds. His initial visit weight was 408 pounds. His preoperative weight was 408 pounds. He saw our PA earlier in the month with complaints of right upper quadrant pain mainly occurring postprandially. It is associated with nausea. It radiates to his back and shoulder. It was worse with certain foods. It lasts anywhere from 15 min to 1 hour. It is different than a dumping episode. He underwent an ultrasound which showed fatty liver and a gallstone. He saw the nutritionist a few months ago and is scheduled for follow-up in September. He underwent arthroscopic knee surgery since his last visit with me a few months ago. He explains as why he's had some weight regain. Otherwise He states he is doing well. He denies any fever, chills, or vomiting. He denies any regurgitation. He has had no constipation. He is getting in around 64 ounces of liquid a day. He denies any chest pain, shortness of breath, calf pain. He does report some intermittent mid left thigh swelling with warmth at times. He reports that he is taking his supplements. He currently denies any hematuria or dysuria or flank pain. He is still using CPAP. He reports that he is taking his multivitamin, calcium, and B12. He is also back on something for his mood. He is taking gabapentin for left lower extremity radiculopathy  Review of systems-a comprehensive 12 point review of systems was performed. All systems are negative except for what is mentioned in the HPI   Problem List/Past  Medical Atilano Ina(Audreena Sachdeva M Yasuko Lapage, MD; 02/15/2016 3:54 PM) LEFT THIGH PAIN (M79.652) SYMPTOMATIC CHOLELITHIASIS (K80.20) BODY MASS INDEX (BMI) OF 40.0 TO 49.9 SWELLING OF LEFT LOWER EXTREMITY (M79.89) ACHILLES TENDINITIS OF BOTH LOWER EXTREMITIES (M76.61, M76.62) LOW BACK PAIN, NON-SPECIFIC (M54.5) HIGH TRIGLYCERIDES (E78.1) OSA ON CPAP (G47.33) ESSENTIAL HYPERTENSION (I10) PREDIABETES (R73.03) HISTORY OF ROUX-EN-Y GASTRIC BYPASS (Z98.84) 05/26/2014 preop wt 408lb; initial visit wt 409 lb  Other Problems Atilano Ina(Wylie Russon M Toma Arts, MD; 02/15/2016 3:54 PM) Kidney Larina BrasStone Other disease, cancer, significant illness  Past Surgical History Atilano Ina(Brendi Mccarroll M Sabrin Dunlevy, MD; 02/15/2016 3:54 PM) Oral Surgery  Diagnostic Studies History Atilano Ina(Davius Goudeau M Ariely Riddell, MD; 02/15/2016 3:54 PM) Colonoscopy never  Allergies (Ammie Eversole, LPN; 0/98/11918/29/2017 4:783:25 PM) LaMICtal *ANTICONVULSANTS* TEGretol *ANTICONVULSANTS* Sulfa Antibiotics  Medication History Atilano Ina(Yaden Seith M Enijah Furr, MD; 02/15/2016 3:54 PM) Raina MinaMultivital-M (Oral) Active. Vitamin D (400UNIT Capsule, Oral) Active. Zinc (50MG  Tablet, Oral) Active. Allopurinol (300MG  Tablet, Oral) Active. Gabapentin (100MG  Capsule, Oral) Active. Carvedilol (3.125MG  Tablet, Oral) Active. Medications Reconciled Ondansetron (4MG  Tablet Disint, 1 (one) Tablet Oral every six hours, as needed, Taken starting 02/15/2016) Active. TraMADol HCl (50MG  Tablet, 1 (one) Tablet Oral every eight hours, as needed, Taken starting 02/15/2016) Active. Multivitamins (Oral) Active.  Social History Atilano Ina(Alizea Pell M Eldora Napp, MD; 02/15/2016 3:54 PM) No alcohol use No drug use Caffeine use Carbonated beverages, Coffee, Tea. Tobacco use Never smoker.  Family History Atilano Ina(Ulani Degrasse M Constancia Geeting, MD; 02/15/2016 3:54 PM) Heart disease in male family member before age 43 Diabetes Mellitus Mother. Heart Disease Mother. Depression Father, Mother. Seizure disorder Son. Heart disease in male family member before age  43 Migraine  Headache Mother. Kidney Disease Father. Alcohol Abuse Father. Respiratory Condition Mother. Hypertension Father, Mother.    Vitals (Ammie Eversole LPN; 1/61/0960 4:54 PM) 02/15/2016 3:24 PM Weight: 336.8 lb Height: 67in Body Surface Area: 2.52 m Body Mass Index: 52.75 kg/m  Temp.: 97.58F(Oral)  Pulse: 86 (Regular)  BP: 142/72 (Sitting, Left Arm, Standard)      Physical Exam Minerva Areola M. Jaimie Pippins MD; 02/15/2016 3:51 PM)  General Mental Status-Alert. General Appearance-Consistent with stated age. Hydration-Well hydrated. Voice-Normal. Note: morbid obese  Head and Neck Head-normocephalic, atraumatic with no lesions or palpable masses. Trachea-midline. Thyroid Gland Characteristics - normal size and consistency.  Eye Eyeball - Bilateral-Extraocular movements intact. Sclera/Conjunctiva - Bilateral-No scleral icterus.  Chest and Lung Exam Chest and lung exam reveals -quiet, even and easy respiratory effort with no use of accessory muscles and on auscultation, normal breath sounds, no adventitious sounds and normal vocal resonance. Inspection Chest Wall - Normal. Back - normal.  Breast - Did not examine.  Cardiovascular Cardiovascular examination reveals -normal heart sounds, regular rate and rhythm with no murmurs.  Abdomen Inspection Inspection of the abdomen reveals - No Hernias. Skin - Scar - Note: well healed trocar sites. Palpation/Percussion Palpation and Percussion of the abdomen reveal - Soft, Non Tender, No Rebound tenderness, No Rigidity (guarding) and No hepatosplenomegaly. Auscultation Auscultation of the abdomen reveals - Bowel sounds normal.  Peripheral Vascular Upper Extremity Palpation - Pulses bilaterally normal.  Neurologic Neurologic evaluation reveals -alert and oriented x 3 with no impairment of recent or remote memory. Mental Status-Normal.  Neuropsychiatric The patient's mood and  affect are described as -normal. Judgment and Insight-insight is appropriate concerning matters relevant to self.  Musculoskeletal Normal Exam - Left-Upper Extremity Strength Normal and Lower Extremity Strength Normal. Normal Exam - Right-Upper Extremity Strength Normal and Lower Extremity Strength Normal.  Lymphatic Head & Neck  General Head & Neck Lymphatics: Bilateral - Description - Normal. Axillary - Did not examine. Femoral & Inguinal - Did not examine.    Assessment & Plan Minerva Areola M. Kiaria Quinnell MD; 02/15/2016 3:54 PM)  HISTORY OF ROUX-EN-Y GASTRIC BYPASS (Z98.84) Story: preop wt 408lb; initial visit wt 409 lb Impression: He has had some weight regain following his surgery which is attributed to having the surgery back in April. Hopefully his increase in activity will help get some of the weight back off. We discussed the importance of ongoing physical activity. We also discussed the importance of good food choices. I think he has some improvement in that aspect along with his physical activity. I'm encouraged that he is doing the nutritionist regularly. He is overdue for bariatric lab surveillance. We will order this as part of his preoperative labs for upcoming cholecystectomy. A small component of his symptoms could also be due to dumping. We discussed dumping syndrome and ways to avoid it.  ESSENTIAL HYPERTENSION (I10) Impression: Stable  PREDIABETES (R73.03) Impression: Due for lab check  LEFT THIGH PAIN (U98.119) Impression: Now long-term. Per PCP  HIGH TRIGLYCERIDES (E78.1) Impression: Stable  OSA ON CPAP (G47.33) Impression: Stable  SYMPTOMATIC CHOLELITHIASIS (K80.20) Impression: I believe the patient's symptoms are consistent with gallbladder disease.  We discussed gallbladder disease. The patient was given Agricultural engineer. We discussed non-operative and operative management. We discussed the signs & symptoms of acute cholecystitis  I discussed  laparoscopic cholecystectomy with IOC in detail. The patient was given educational material as well as diagrams detailing the procedure. We discussed the risks and benefits of a laparoscopic cholecystectomy including, but not limited to bleeding,  infection, injury to surrounding structures such as the intestine or liver, bile leak, retained gallstones, need to convert to an open procedure, prolonged diarrhea, blood clots such as DVT, common bile duct injury, anesthesia risks, and possible need for additional procedures. We discussed the typical post-operative recovery course. I explained that the likelihood of improvement of their symptoms is good.  The patient has elected to proceed with surgery. I gave him a prescription for Zofran and tramadol.  Current Plans You are being scheduled for surgery - Our schedulers will call you.  You should hear from our office's scheduling department within 5 working days about the location, date, and time of surgery. We try to make accommodations for patient's preferences in scheduling surgery, but sometimes the OR schedule or the surgeon's schedule prevents [place] from making those accommodations.  If you have not heard from our office [phone]) in 5 working days, call the office and ask for your surgeon's nurse.  If you have other questions about your diagnosis, plan, or surgery, call the office and ask for your surgeon's nurse.  Pt Education - Pamphlet Given - Laparoscopic Gallbladder Surgery: discussed with patient and provided information. Started Ondansetron 4MG , 1 (one) Tablet every six hours, as needed, #20, 02/15/2016, No Refill. Started TraMADol HCl 50MG , 1 (one) Tablet every eight hours, as needed, #15, 02/15/2016, No Refill.  Mary Sella. Andrey Campanile, MD, FACS General, Bariatric, & Minimally Invasive Surgery [facility], [place]

## 2016-02-16 ENCOUNTER — Ambulatory Visit: Payer: BLUE CROSS/BLUE SHIELD | Admitting: Family Medicine

## 2016-02-17 ENCOUNTER — Other Ambulatory Visit: Payer: Self-pay | Admitting: *Deleted

## 2016-02-17 MED ORDER — BUSPIRONE HCL 7.5 MG PO TABS: 7.5000 mg | ORAL_TABLET | Freq: Two times a day (BID) | ORAL | 4 refills | Status: DC

## 2016-02-22 DIAGNOSIS — Z9884 Bariatric surgery status: Secondary | ICD-10-CM | POA: Diagnosis not present

## 2016-02-29 ENCOUNTER — Ambulatory Visit (INDEPENDENT_AMBULATORY_CARE_PROVIDER_SITE_OTHER): Payer: BLUE CROSS/BLUE SHIELD | Admitting: Podiatry

## 2016-02-29 DIAGNOSIS — M7662 Achilles tendinitis, left leg: Secondary | ICD-10-CM

## 2016-02-29 DIAGNOSIS — M7661 Achilles tendinitis, right leg: Secondary | ICD-10-CM

## 2016-02-29 DIAGNOSIS — M722 Plantar fascial fibromatosis: Secondary | ICD-10-CM | POA: Diagnosis not present

## 2016-02-29 MED ORDER — OXYCODONE-ACETAMINOPHEN 10-325 MG PO TABS: 1.0000 | ORAL_TABLET | Freq: Four times a day (QID) | ORAL | 0 refills | Status: DC | PRN

## 2016-02-29 NOTE — Progress Notes (Signed)
He presents today with chief complaint of pain to the posterior aspect of the left heel and the inferior aspect of the right heel. He states that I think any injections again. And I will like the pain medication if possible.  Objective: Vital signs are stable alert and oriented 3 pulses are palpable. He has pain on palpation to the posterior aspect of the left heel and the plantar fascia area of the right heel. Pulses remain palpable no open lesions or wounds are noted.  Assessment: Insertional Achilles tendinitis left heel. Plantar fasciitis right heel.  Plan: I injected the plantar fascia area today with Kenalog and local anesthetic. I injected the posterior aspect of the left heel today with dexamethasone and local anesthetic. I also guarded him on Percocet. He will be having a cholecystectomy in the next few days. I encouraged him to continue to lose weight and that this should help alleviate his symptoms. May need to consider orthotics.

## 2016-03-01 ENCOUNTER — Encounter (HOSPITAL_COMMUNITY): Payer: Self-pay

## 2016-03-03 NOTE — Patient Instructions (Signed)
Justin [name]  03/03/2016   Your procedure is scheduled on: 03/13/2016    [facility]  Entrance [place]  elevators to 3rd floor to  Short Stay Center at   0915 AM.  Call this number if you have problems the morning of surgery [phone]   Remember: ONLY 1 PERSON MAY GO WITH YOU TO SHORT STAY TO GET  READY MORNING OF YOUR SURGERY.  Do not eat food or drink liquids :After Midnight.     Take these medicines the morning of surgery with A SIP OF WATER: Allopurinol, Buspar, Carvedilol ( coreg), Oxycodone if needed                                 You may not have any metal on your body including hair pins and              piercings  Do not wear jewelry, , lotions, powders or perfumes, deodorant               Men may shave face and neck.   Do not bring valuables to the hospital. [place] IS NOT             RESPONSIBLE   FOR VALUABLES.  Contacts, dentures or bridgework may not be worn into surgery.       Patients discharged the day of surgery will not be allowed to drive home.  Name and phone number of your driver:  Special Instructions:              Please read over the following fact sheets you were given: _____________________________________________________________________             [facility] - Preparing for Surgery Before surgery, you can play an important role.  Because skin is not sterile, your skin needs to be as free of germs as possible.  You can reduce the number of germs on your skin by washing with CHG (chlorahexidine gluconate) soap before surgery.  CHG is an antiseptic cleaner which kills germs and bonds with the skin to continue killing germs even after washing. Please DO NOT use if you have an allergy to CHG or antibacterial soaps.  If your skin becomes reddened/irritated stop using the CHG and inform your nurse when you arrive at Short Stay. Do not shave (including legs and underarms) for at least 48 hours prior to the  first CHG shower.  You may shave your face/neck. Please follow these instructions carefully:  1.  Shower with CHG Soap the night before surgery and the  morning of Surgery.  2.  If you choose to wash your hair, wash your hair first as usual with your  normal  shampoo.  3.  After you shampoo, rinse your hair and body thoroughly to remove the  shampoo.                           4.  Use CHG as you would any other liquid soap.  You can apply chg directly  to the skin and wash                       Gently with a scrungie or clean washcloth.  5.  Apply the CHG Soap to your body  ONLY FROM THE NECK DOWN.   Do not use on face/ open                           Wound or open sores. Avoid contact with eyes, ears mouth and genitals (private parts).                       Wash face,  Genitals (private parts) with your normal soap.             6.  Wash thoroughly, paying special attention to the area where your surgery  will be performed.  7.  Thoroughly rinse your body with warm water from the neck down.  8.  DO NOT shower/wash with your normal soap after using and rinsing off  the CHG Soap.                9.  Pat yourself dry with a clean towel.            10.  Wear clean pajamas.            11.  Place clean sheets on your bed the night of your first shower and do not  sleep with pets. Day of Surgery : Do not apply any lotions/deodorants the morning of surgery.  Please wear clean clothes to the hospital/surgery center.  FAILURE TO FOLLOW THESE INSTRUCTIONS MAY RESULT IN THE CANCELLATION OF YOUR SURGERY PATIENT SIGNATURE_________________________________  NURSE SIGNATURE__________________________________  ________________________________________________________________________

## 2016-03-06 ENCOUNTER — Encounter (HOSPITAL_COMMUNITY)
Admission: RE | Admit: 2016-03-06 | Discharge: 2016-03-06 | Disposition: A | Discharge status: Home / Self Care | Payer: BLUE CROSS/BLUE SHIELD | Source: Ambulatory Visit | Attending: General Surgery | Admitting: General Surgery

## 2016-03-06 ENCOUNTER — Encounter (HOSPITAL_COMMUNITY): Payer: Self-pay

## 2016-03-06 DIAGNOSIS — K8000 Calculus of gallbladder with acute cholecystitis without obstruction: Secondary | ICD-10-CM | POA: Diagnosis not present

## 2016-03-06 DIAGNOSIS — Z01810 Encounter for preprocedural cardiovascular examination: Secondary | ICD-10-CM | POA: Insufficient documentation

## 2016-03-06 DIAGNOSIS — Z01812 Encounter for preprocedural laboratory examination: Secondary | ICD-10-CM | POA: Insufficient documentation

## 2016-03-06 LAB — COMPREHENSIVE METABOLIC PANEL
ALT: 20 U/L (ref 17–63)
AST: 26 U/L (ref 15–41)
Albumin: 4.1 g/dL (ref 3.5–5.0)
Alkaline Phosphatase: 85 U/L (ref 38–126)
Anion gap: 8 (ref 5–15)
BUN: 15 mg/dL (ref 6–20)
CHLORIDE: 105 mmol/L (ref 101–111)
CO2: 27 mmol/L (ref 22–32)
CREATININE: 0.84 mg/dL (ref 0.61–1.24)
Calcium: 8.9 mg/dL (ref 8.9–10.3)
GFR calc non Af Amer: >60 mL/min (ref >60)
Glucose, Bld: 93 mg/dL (ref 65–99)
Potassium: 4.1 mmol/L (ref 3.5–5.1)
SODIUM: 140 mmol/L (ref 135–145)
Total Bilirubin: 0.9 mg/dL (ref 0.3–1.2)
Total Protein: 7.5 g/dL (ref 6.5–8.1)

## 2016-03-06 LAB — CBC WITH DIFFERENTIAL/PLATELET
BASOS ABS: 0.1 10*3/uL (ref 0.0–0.1)
Basophils Relative: 1 %
EOS ABS: 0.3 10*3/uL (ref 0.0–0.7)
EOS PCT: 3 %
HCT: 42 % (ref 39.0–52.0)
Hemoglobin: 14 g/dL (ref 13.0–17.0)
Lymphocytes Relative: 30 %
Lymphs Abs: 3.5 10*3/uL (ref 0.7–4.0)
MCH: 30.4 pg (ref 26.0–34.0)
MCHC: 33.3 g/dL (ref 30.0–36.0)
MCV: 91.1 fL (ref 78.0–100.0)
Monocytes Absolute: 0.9 10*3/uL (ref 0.1–1.0)
Monocytes Relative: 7 %
Neutro Abs: 7.1 10*3/uL (ref 1.7–7.7)
Neutrophils Relative %: 59 %
PLATELETS: 389 10*3/uL (ref 150–400)
RBC: 4.61 MIL/uL (ref 4.22–5.81)
RDW: 14.2 % (ref 11.5–15.5)
WBC: 11.9 10*3/uL — AB (ref 4.0–10.5)

## 2016-03-06 NOTE — Progress Notes (Signed)
2015- ECHO-EPIC  07/30/15- LOV- Cardiology- epic

## 2016-03-07 NOTE — Progress Notes (Signed)
Final EKG done 03/06/16- EPIC

## 2016-03-08 ENCOUNTER — Ambulatory Visit: Payer: BLUE CROSS/BLUE SHIELD | Admitting: Dietician

## 2016-03-13 ENCOUNTER — Encounter (HOSPITAL_COMMUNITY): Payer: Self-pay | Admitting: *Deleted

## 2016-03-13 ENCOUNTER — Ambulatory Visit (HOSPITAL_COMMUNITY): Payer: BLUE CROSS/BLUE SHIELD

## 2016-03-13 ENCOUNTER — Encounter (HOSPITAL_COMMUNITY)
Admission: RE | Disposition: A | Discharge status: Home / Self Care | Payer: Self-pay | Source: Ambulatory Visit | Attending: General Surgery

## 2016-03-13 ENCOUNTER — Ambulatory Visit (HOSPITAL_COMMUNITY): Payer: BLUE CROSS/BLUE SHIELD | Admitting: Registered Nurse

## 2016-03-13 ENCOUNTER — Ambulatory Visit (HOSPITAL_COMMUNITY)
Admission: RE | Admit: 2016-03-13 | Discharge: 2016-03-13 | Disposition: A | Discharge status: Home / Self Care | Payer: BLUE CROSS/BLUE SHIELD | Source: Ambulatory Visit | Attending: General Surgery | Admitting: General Surgery

## 2016-03-13 DIAGNOSIS — R7303 Prediabetes: Secondary | ICD-10-CM | POA: Diagnosis not present

## 2016-03-13 DIAGNOSIS — E781 Pure hyperglyceridemia: Secondary | ICD-10-CM | POA: Insufficient documentation

## 2016-03-13 DIAGNOSIS — I10 Essential (primary) hypertension: Secondary | ICD-10-CM | POA: Insufficient documentation

## 2016-03-13 DIAGNOSIS — K8080 Other cholelithiasis without obstruction: Secondary | ICD-10-CM | POA: Diagnosis not present

## 2016-03-13 DIAGNOSIS — Z79899 Other long term (current) drug therapy: Secondary | ICD-10-CM | POA: Diagnosis not present

## 2016-03-13 DIAGNOSIS — Z9884 Bariatric surgery status: Secondary | ICD-10-CM | POA: Diagnosis not present

## 2016-03-13 DIAGNOSIS — R52 Pain, unspecified: Secondary | ICD-10-CM

## 2016-03-13 DIAGNOSIS — M79652 Pain in left thigh: Secondary | ICD-10-CM | POA: Diagnosis not present

## 2016-03-13 DIAGNOSIS — G4733 Obstructive sleep apnea (adult) (pediatric): Secondary | ICD-10-CM | POA: Insufficient documentation

## 2016-03-13 DIAGNOSIS — F419 Anxiety disorder, unspecified: Secondary | ICD-10-CM | POA: Insufficient documentation

## 2016-03-13 DIAGNOSIS — G4730 Sleep apnea, unspecified: Secondary | ICD-10-CM | POA: Diagnosis not present

## 2016-03-13 DIAGNOSIS — Z6841 Body Mass Index (BMI) 40.0 and over, adult: Secondary | ICD-10-CM | POA: Diagnosis not present

## 2016-03-13 DIAGNOSIS — K8010 Calculus of gallbladder with chronic cholecystitis without obstruction: Secondary | ICD-10-CM | POA: Insufficient documentation

## 2016-03-13 DIAGNOSIS — K8020 Calculus of gallbladder without cholecystitis without obstruction: Secondary | ICD-10-CM | POA: Diagnosis not present

## 2016-03-13 HISTORY — PX: CHOLECYSTECTOMY: SHX55

## 2016-03-13 SURGERY — LAPAROSCOPIC CHOLECYSTECTOMY WITH INTRAOPERATIVE CHOLANGIOGRAM
Anesthesia: General | Site: Abdomen

## 2016-03-13 MED ORDER — SODIUM CHLORIDE 0.9 % IJ SOLN
INTRAMUSCULAR | Status: AC
  Filled 2016-03-13: qty 10

## 2016-03-13 MED ORDER — OXYCODONE HCL 5 MG/5ML PO SOLN
5.0000 mg | Freq: Once | ORAL | Status: AC | PRN
  Filled 2016-03-13: qty 5

## 2016-03-13 MED ORDER — 0.9 % SODIUM CHLORIDE (POUR BTL) OPTIME
TOPICAL | Status: DC | PRN
  Administered 2016-03-13: 1000 mL

## 2016-03-13 MED ORDER — LACTATED RINGERS IR SOLN
Status: DC | PRN
  Administered 2016-03-13: 1000 mL

## 2016-03-13 MED ORDER — LIDOCAINE HCL (CARDIAC) 20 MG/ML IV SOLN
INTRAVENOUS | Status: DC | PRN
  Administered 2016-03-13: 75 mg via INTRAVENOUS
  Administered 2016-03-13: 25 mg via INTRAVENOUS

## 2016-03-13 MED ORDER — CHLORHEXIDINE GLUCONATE CLOTH 2 % EX PADS: 6.0000 | MEDICATED_PAD | Freq: Once | CUTANEOUS | Status: DC

## 2016-03-13 MED ORDER — IOPAMIDOL (ISOVUE-300) INJECTION 61%
INTRAVENOUS | Status: AC
  Filled 2016-03-13: qty 50

## 2016-03-13 MED ORDER — BUPIVACAINE-EPINEPHRINE 0.5% -1:200000 IJ SOLN
INTRAMUSCULAR | Status: DC | PRN
  Administered 2016-03-13: 50 mL

## 2016-03-13 MED ORDER — SODIUM CHLORIDE 0.9 % IV SOLN: 250.0000 mL | INTRAVENOUS | Status: DC | PRN

## 2016-03-13 MED ORDER — LABETALOL HCL 5 MG/ML IV SOLN
INTRAVENOUS | Status: DC | PRN
  Administered 2016-03-13: 7.5 mg via INTRAVENOUS

## 2016-03-13 MED ORDER — BUPIVACAINE-EPINEPHRINE 0.5% -1:200000 IJ SOLN
INTRAMUSCULAR | Status: AC
  Filled 2016-03-13: qty 1

## 2016-03-13 MED ORDER — SODIUM CHLORIDE 0.9% FLUSH: 3.0000 mL | Freq: Two times a day (BID) | INTRAVENOUS | Status: DC

## 2016-03-13 MED ORDER — SUCCINYLCHOLINE CHLORIDE 20 MG/ML IJ SOLN
INTRAMUSCULAR | Status: DC | PRN
  Administered 2016-03-13: 140 mg via INTRAVENOUS

## 2016-03-13 MED ORDER — PROPOFOL 10 MG/ML IV BOLUS
INTRAVENOUS | Status: DC | PRN
  Administered 2016-03-13: 250 mg via INTRAVENOUS

## 2016-03-13 MED ORDER — IOPAMIDOL (ISOVUE-300) INJECTION 61%
INTRAVENOUS | Status: DC | PRN
  Administered 2016-03-13: 50 mL via INTRAVENOUS

## 2016-03-13 MED ORDER — ROCURONIUM BROMIDE 100 MG/10ML IV SOLN
INTRAVENOUS | Status: DC | PRN
  Administered 2016-03-13: 10 mg via INTRAVENOUS
  Administered 2016-03-13: 50 mg via INTRAVENOUS

## 2016-03-13 MED ORDER — SUGAMMADEX SODIUM 500 MG/5ML IV SOLN
INTRAVENOUS | Status: AC
Start: 2016-03-13 — End: 2016-03-13
  Filled 2016-03-13: qty 5

## 2016-03-13 MED ORDER — OXYCODONE HCL 5 MG PO TABS
5.0000 mg | ORAL_TABLET | Freq: Once | ORAL | Status: AC | PRN
  Administered 2016-03-13: 5 mg via ORAL

## 2016-03-13 MED ORDER — SUGAMMADEX SODIUM 500 MG/5ML IV SOLN
INTRAVENOUS | Status: DC | PRN
  Administered 2016-03-13: 500 mg via INTRAVENOUS

## 2016-03-13 MED ORDER — SODIUM CHLORIDE 0.9 % IV SOLN: INTRAVENOUS | Status: DC

## 2016-03-13 MED ORDER — PROPOFOL 10 MG/ML IV BOLUS
INTRAVENOUS | Status: AC
Start: 2016-03-13 — End: 2016-03-13
  Filled 2016-03-13: qty 40

## 2016-03-13 MED ORDER — ACETAMINOPHEN 325 MG PO TABS: 650.0000 mg | ORAL_TABLET | ORAL | Status: DC | PRN

## 2016-03-13 MED ORDER — DEXAMETHASONE SODIUM PHOSPHATE 10 MG/ML IJ SOLN
INTRAMUSCULAR | Status: AC
  Filled 2016-03-13: qty 1

## 2016-03-13 MED ORDER — OXYCODONE-ACETAMINOPHEN 5-325 MG PO TABS: 1.0000 | ORAL_TABLET | ORAL | 0 refills | Status: DC | PRN

## 2016-03-13 MED ORDER — ONDANSETRON HCL 4 MG/2ML IJ SOLN
INTRAMUSCULAR | Status: AC
  Filled 2016-03-13: qty 2

## 2016-03-13 MED ORDER — ONDANSETRON HCL 4 MG/2ML IJ SOLN
INTRAMUSCULAR | Status: DC | PRN
  Administered 2016-03-13: 4 mg via INTRAVENOUS

## 2016-03-13 MED ORDER — FENTANYL CITRATE (PF) 100 MCG/2ML IJ SOLN: 25.0000 ug | INTRAMUSCULAR | Status: DC | PRN

## 2016-03-13 MED ORDER — CEFOTETAN DISODIUM-DEXTROSE 2-2.08 GM-% IV SOLR
INTRAVENOUS | Status: AC
  Filled 2016-03-13: qty 50

## 2016-03-13 MED ORDER — MIDAZOLAM HCL 2 MG/2ML IJ SOLN
INTRAMUSCULAR | Status: AC
  Filled 2016-03-13: qty 2

## 2016-03-13 MED ORDER — SUFENTANIL CITRATE 50 MCG/ML IV SOLN
INTRAVENOUS | Status: AC
  Filled 2016-03-13: qty 1

## 2016-03-13 MED ORDER — SUFENTANIL CITRATE 50 MCG/ML IV SOLN
INTRAVENOUS | Status: DC | PRN
  Administered 2016-03-13 (×3): 10 ug via INTRAVENOUS

## 2016-03-13 MED ORDER — FENTANYL CITRATE (PF) 100 MCG/2ML IJ SOLN
INTRAMUSCULAR | Status: AC
  Filled 2016-03-13: qty 2

## 2016-03-13 MED ORDER — OXYCODONE HCL 5 MG PO TABS
5.0000 mg | ORAL_TABLET | ORAL | Status: DC | PRN
  Filled 2016-03-13: qty 1

## 2016-03-13 MED ORDER — DEXAMETHASONE SODIUM PHOSPHATE 10 MG/ML IJ SOLN
INTRAMUSCULAR | Status: DC | PRN
  Administered 2016-03-13: 10 mg via INTRAVENOUS

## 2016-03-13 MED ORDER — LIDOCAINE 2% (20 MG/ML) 5 ML SYRINGE
INTRAMUSCULAR | Status: AC
  Filled 2016-03-13: qty 5

## 2016-03-13 MED ORDER — LACTATED RINGERS IV SOLN
INTRAVENOUS | Status: DC
  Administered 2016-03-13: 11:00:00 via INTRAVENOUS

## 2016-03-13 MED ORDER — FENTANYL CITRATE (PF) 100 MCG/2ML IJ SOLN
25.0000 ug | INTRAMUSCULAR | Status: DC | PRN
  Administered 2016-03-13 (×2): 50 ug via INTRAVENOUS

## 2016-03-13 MED ORDER — ROCURONIUM BROMIDE 10 MG/ML (PF) SYRINGE
PREFILLED_SYRINGE | INTRAVENOUS | Status: AC
  Filled 2016-03-13: qty 10

## 2016-03-13 MED ORDER — CEFOTETAN DISODIUM-DEXTROSE 2-2.08 GM-% IV SOLR
2.0000 g | INTRAVENOUS | Status: AC
  Administered 2016-03-13: 2 g via INTRAVENOUS

## 2016-03-13 MED ORDER — SODIUM CHLORIDE 0.9% FLUSH: 3.0000 mL | INTRAVENOUS | Status: DC | PRN

## 2016-03-13 MED ORDER — ACETAMINOPHEN 650 MG RE SUPP
650.0000 mg | RECTAL | Status: DC | PRN
  Filled 2016-03-13: qty 1

## 2016-03-13 MED ORDER — HEPARIN SODIUM (PORCINE) 5000 UNIT/ML IJ SOLN
5000.0000 [IU] | Freq: Once | INTRAMUSCULAR | Status: AC
  Administered 2016-03-13: 5000 [IU] via SUBCUTANEOUS
  Filled 2016-03-13: qty 1

## 2016-03-13 MED ORDER — MIDAZOLAM HCL 5 MG/5ML IJ SOLN
INTRAMUSCULAR | Status: DC | PRN
Start: 2016-03-13 — End: 2016-03-13
  Administered 2016-03-13: 2 mg via INTRAVENOUS

## 2016-03-13 SURGICAL SUPPLY — 46 items
APPLICATOR ARISTA FLEXITIP XL (MISCELLANEOUS) IMPLANT
APPLIER CLIP 5 13 M/L LIGAMAX5 (MISCELLANEOUS)
APPLIER CLIP ROT 10 11.4 M/L (STAPLE) ×2
BANDAGE ADH SHEER 1  50/CT (GAUZE/BANDAGES/DRESSINGS) ×2 IMPLANT
BENZOIN TINCTURE PRP APPL 2/3 (GAUZE/BANDAGES/DRESSINGS) ×2 IMPLANT
CABLE HIGH FREQUENCY MONO STRZ (ELECTRODE) ×2 IMPLANT
CHLORAPREP W/TINT 26ML (MISCELLANEOUS) ×4 IMPLANT
CLIP APPLIE 5 13 M/L LIGAMAX5 (MISCELLANEOUS) IMPLANT
CLIP APPLIE ROT 10 11.4 M/L (STAPLE) ×1 IMPLANT
CLOSURE STERI-STRIP 1/4X4 (GAUZE/BANDAGES/DRESSINGS) IMPLANT
COVER MAYO STAND STRL (DRAPES) ×2 IMPLANT
COVER SURGICAL LIGHT HANDLE (MISCELLANEOUS) IMPLANT
DECANTER SPIKE VIAL GLASS SM (MISCELLANEOUS) ×2 IMPLANT
DEVICE TROCAR PUNCTURE CLOSURE (ENDOMECHANICALS) ×2 IMPLANT
DRAPE C-ARM 42X120 X-RAY (DRAPES) ×2 IMPLANT
DRSG TEGADERM 2-3/8X2-3/4 SM (GAUZE/BANDAGES/DRESSINGS) ×2 IMPLANT
ELECT PENCIL ROCKER SW 15FT (MISCELLANEOUS) ×2 IMPLANT
ELECT REM PT RETURN 9FT ADLT (ELECTROSURGICAL) ×2
ELECTRODE REM PT RTRN 9FT ADLT (ELECTROSURGICAL) ×1 IMPLANT
GAUZE SPONGE 2X2 8PLY STRL LF (GAUZE/BANDAGES/DRESSINGS) ×1 IMPLANT
GLOVE BIO SURGEON STRL SZ7.5 (GLOVE) ×2 IMPLANT
GLOVE INDICATOR 8.0 STRL GRN (GLOVE) ×2 IMPLANT
GOWN STRL REUS W/TWL XL LVL3 (GOWN DISPOSABLE) ×10 IMPLANT
HEMOSTAT ARISTA ABSORB 3G PWDR (MISCELLANEOUS) IMPLANT
HEMOSTAT SNOW SURGICEL 2X4 (HEMOSTASIS) ×2 IMPLANT
IRRIG SUCT STRYKERFLOW 2 WTIP (MISCELLANEOUS) ×2
IRRIGATION SUCT STRKRFLW 2 WTP (MISCELLANEOUS) ×1 IMPLANT
KIT BASIN OR (CUSTOM PROCEDURE TRAY) ×2 IMPLANT
L-HOOK LAP DISP 36CM (ELECTROSURGICAL) ×2
LHOOK LAP DISP 36CM (ELECTROSURGICAL) ×1 IMPLANT
POUCH RETRIEVAL ECOSAC 10 (ENDOMECHANICALS) ×1 IMPLANT
POUCH RETRIEVAL ECOSAC 10MM (ENDOMECHANICALS) ×1
SCISSORS LAP 5X35 DISP (ENDOMECHANICALS) ×2 IMPLANT
SET CHOLANGIOGRAPH MIX (MISCELLANEOUS) ×2 IMPLANT
SLEEVE XCEL OPT CAN 5 100 (ENDOMECHANICALS) ×2 IMPLANT
SPONGE GAUZE 2X2 STER 10/PKG (GAUZE/BANDAGES/DRESSINGS) ×1
STRIP CLOSURE SKIN 1/2X4 (GAUZE/BANDAGES/DRESSINGS) ×2 IMPLANT
SUT MNCRL AB 4-0 PS2 18 (SUTURE) ×4 IMPLANT
SUT VICRYL 0 UR6 27IN ABS (SUTURE) ×2 IMPLANT
TOWEL OR 17X26 10 PK STRL BLUE (TOWEL DISPOSABLE) ×2 IMPLANT
TOWEL OR NON WOVEN STRL DISP B (DISPOSABLE) ×2 IMPLANT
TRAY LAPAROSCOPIC (CUSTOM PROCEDURE TRAY) ×2 IMPLANT
TROCAR BLADELESS OPT 5 100 (ENDOMECHANICALS) ×2 IMPLANT
TROCAR XCEL BLUNT TIP 100MML (ENDOMECHANICALS) IMPLANT
TROCAR XCEL NON-BLD 11X100MML (ENDOMECHANICALS) ×2 IMPLANT
TUBING INSUF HEATED (TUBING) ×2 IMPLANT

## 2016-03-13 NOTE — Anesthesia Preprocedure Evaluation (Signed)
Anesthesia Evaluation  Patient identified by MRN, date of birth, ID band Patient awake    Reviewed: Allergy & Precautions, NPO status , Patient's Chart, lab work & pertinent test results, reviewed documented beta blocker date and time   History of Anesthesia Complications Negative for: history of anesthetic complications  Airway Mallampati: III  TM Distance: >3 FB Neck ROM: Full    Dental  (+) Dental Advisory Given   Pulmonary sleep apnea and Continuous Positive Airway Pressure Ventilation ,    breath sounds clear to auscultation       Cardiovascular hypertension, Pt. on medications and Pt. on home beta blockers  Rhythm:Regular Rate:Normal     Neuro/Psych Anxiety Bipolar Disorder negative neurological ROS     GI/Hepatic negative GI ROS, Neg liver ROS,   Endo/Other  Morbid obesity  Renal/GU negative Renal ROS     Musculoskeletal  (+) Arthritis ,   Abdominal   Peds  Hematology negative hematology ROS (+)   Anesthesia Other Findings   Reproductive/Obstetrics                             Anesthesia Physical Anesthesia Plan  ASA: III  Anesthesia Plan: General   Post-op Pain Management:    Induction: Intravenous  Airway Management Planned: Oral ETT  Additional Equipment: None  Intra-op Plan:   Post-operative Plan: Extubation in OR  Informed Consent: I have reviewed the patients History and Physical, chart, labs and discussed the procedure including the risks, benefits and alternatives for the proposed anesthesia with the patient or authorized representative who has indicated his/her understanding and acceptance.   Dental advisory given  Plan Discussed with: CRNA and Surgeon  Anesthesia Plan Comments:         Anesthesia Quick Evaluation

## 2016-03-13 NOTE — Anesthesia Postprocedure Evaluation (Signed)
Anesthesia Post Note  Patient: Justin [name]  Procedure(s) Performed: Procedure(s) (LRB): LAPAROSCOPIC CHOLECYSTECTOMY WITH INTRAOPERATIVE CHOLANGIOGRAM (N/A)  Patient location during evaluation: PACU Anesthesia Type: General Level of consciousness: awake and alert Pain management: pain level controlled Vital Signs Assessment: post-procedure vital signs reviewed and stable Respiratory status: spontaneous breathing, nonlabored ventilation, respiratory function stable and patient connected to nasal cannula oxygen Cardiovascular status: blood pressure returned to baseline and stable Postop Assessment: no signs of nausea or vomiting Anesthetic complications: no    Last Vitals:  Vitals:   03/13/16 1400 03/13/16 1411  BP: (!) 141/97 140/90  Pulse: 67 71  Resp: 20 20  Temp: 36.8 C 36.8 C    Last Pain:  Vitals:   03/13/16 1423  TempSrc:   PainSc: 6                  Shelton SilvasKevin D Rudra Hobbins

## 2016-03-13 NOTE — Discharge Instructions (Signed)
[facility]   LAPAROSCOPIC SURGERY: POST OP INSTRUCTIONS Always review your discharge instruction sheet given to you by the facility where your surgery was performed. IF YOU HAVE DISABILITY OR FAMILY LEAVE FORMS, YOU MUST BRING THEM TO THE OFFICE FOR PROCESSING.   DO NOT GIVE THEM TO YOUR DOCTOR.  1. A prescription for pain medication may be given to you upon discharge.  Take your pain medication as prescribed, if needed.  If narcotic pain medicine is not needed, then you may take acetaminophen (Tylenol) as needed. 2. Take your usually prescribed medications unless otherwise directed. 3. If you need a refill on your pain medication, please contact your pharmacy.  They will contact our office to request authorization. Prescriptions will not be filled after 5pm or on week-ends. 4. You should follow a light diet the first few days after arrival home, such as soup and crackers, etc.  Be sure to include lots of fluids daily. 5. Most patients will experience some swelling and bruising in the area of the incisions.  Ice packs will help.  Swelling and bruising can take several days to resolve.  6. It is common to experience some constipation if taking pain medication after surgery.  Increasing fluid intake and taking a stool softener (such as Colace) will usually help or prevent this problem from occurring.  A mild laxative (Milk of Magnesia or Miralax) should be taken according to package instructions if there are no bowel movements after 48 hours. 7. Unless discharge instructions indicate otherwise, you may remove your bandages 48 hours after surgery, and you may shower at that time.  You  have steri-strips (small skin tapes) in place directly over the incision.  These strips should be left on the skin for 7-10 days.  8. ACTIVITIES:  You may resume regular (light) daily activities beginning the next day--such as daily self-care, walking, climbing stairs--gradually increasing  activities as tolerated.  You may have sexual intercourse when it is comfortable.  Refrain from any heavy lifting or straining until approved by your doctor. a. You may drive when you are no longer taking prescription pain medication, you can comfortably wear a seatbelt, and you can safely maneuver your car and apply brakes. 9. You should see your doctor in the office for a follow-up appointment approximately 2-3 weeks after your surgery.  Make sure that you call for this appointment within a day or two after you arrive home to insure a convenient appointment time. 10. OTHER INSTRUCTIONS:Finish your current pain pill prescription you have at home first   WHEN TO CALL YOUR DOCTOR: 1. Fever over 101.0 2. Inability to urinate 3. Continued bleeding from incision. 4. Increased pain, redness, or drainage from the incision. 5. Increasing abdominal pain  The clinic staff is available to answer your questions during regular business hours.  Please dont hesitate to call and ask to speak to one of the nurses for clinical concerns.  If you have a medical emergency, go to the nearest emergency room or call 911.  A surgeon from [facility] is always on call at the hospital.    General Anesthesia, Adult, Care After Refer to this sheet in the next few weeks. These instructions provide you with information on caring for yourself after your procedure. Your health care provider may also give you more specific instructions. Your treatment has been planned according to current medical practices, but problems sometimes occur. Call your health care provider if you have  any problems or questions after your procedure. WHAT TO EXPECT AFTER THE PROCEDURE After the procedure, it is typical to experience:  Sleepiness.  Nausea and vomiting. HOME CARE INSTRUCTIONS  For the first 24 hours after general anesthesia:  Have a responsible person with you.  Do not drive a car. If you are alone, do not take  public transportation.  Do not drink alcohol.  Do not take medicine that has not been prescribed by your health care provider.  Do not sign important papers or make important decisions.  You may resume a normal diet and activities as directed by your health care provider.  Change bandages (dressings) as directed.  If you have questions or problems that seem related to general anesthesia, call the hospital and ask for the anesthetist or anesthesiologist on call. SEEK MEDICAL CARE IF:  You have nausea and vomiting that continue the day after anesthesia.  You develop a rash. SEEK IMMEDIATE MEDICAL CARE IF:   You have difficulty breathing.  You have chest pain.  You have any allergic problems.   This information is not intended to replace advice given to you by your health care provider. Make sure you discuss any questions you have with your health care provider.   Document Released: 09/11/2000 Document Revised: 06/26/2014 Document Reviewed: 10/04/2011 Elsevier Interactive Patient Education [employer]2016 Elsevier Inc.     [address], [address], [place], [place]  [address] ? [address] [address], [place], [place]   [address] [phone] ? [phone] ? FAX [phone] Web site: [email or web address]

## 2016-03-13 NOTE — Progress Notes (Signed)
O.K. For patient to go to Short Stay- per Dr. Hart RochesterHollis -

## 2016-03-13 NOTE — Transfer of Care (Signed)
Immediate Anesthesia Transfer of Care Note  Patient: Justin [name]  Procedure(s) Performed: Procedure(s): LAPAROSCOPIC CHOLECYSTECTOMY WITH INTRAOPERATIVE CHOLANGIOGRAM (N/A)  Patient Location: PACU  Anesthesia Type:General  Level of Consciousness: sedated  Airway & Oxygen Therapy: Patient Spontanous Breathing and Patient connected to face mask oxygen  Post-op Assessment: Report given to RN and Post -op Vital signs reviewed and stable  Post vital signs: Reviewed and stable  Last Vitals:  Vitals:   03/13/16 0909  BP: 131/83  Pulse: 64  Resp: 18  Temp: 36.7 C    Last Pain:  Vitals:   03/13/16 0909  TempSrc: Oral         Complications: No apparent anesthesia complications

## 2016-03-13 NOTE — Op Note (Signed)
Justin MohWilliam C Simi [id number] 17-Feb-1971 03/13/2016  Laparoscopic Cholecystectomy with IOC Procedure Note  Indications: This patient presents with symptomatic gallbladder disease and will undergo laparoscopic cholecystectomy. The patient has a h/o laparoscopic roux en y gastric bypass. He has postprandial RUQ pain and an u/s demonstrated cholelithiasis  Pre-operative Diagnosis: symptomatic cholelithiasis  Post-operative Diagnosis: Same  Surgeon: Atilano InaWILSON,Hykeem Ojeda M   Assistants: Phylliss Blakeshelsea Connor MD  Anesthesia: General endotracheal anesthesia    Procedure Details  The patient was seen again in the Holding Room. The risks, benefits, complications, treatment options, and expected outcomes were discussed with the patient. The possibilities of reaction to medication, pulmonary aspiration, perforation of viscus, bleeding, recurrent infection, finding a normal gallbladder, the need for additional procedures, failure to diagnose a condition, the possible need to convert to an open procedure, and creating a complication requiring transfusion or operation were discussed with the patient. The likelihood of improving the patient's symptoms with return to their baseline status is good.  The patient and/or family concurred with the proposed plan, giving informed consent. The site of surgery properly noted. The patient was taken to Operating Room, identified as Justin [name] and the procedure verified as Laparoscopic Cholecystectomy with Intraoperative Cholangiogram. A Time Out was held and the above information confirmed. Antibiotic prophylaxis was administered.   Prior to the induction of general anesthesia, antibiotic prophylaxis was administered. General endotracheal anesthesia was then administered and tolerated well. After the induction, the abdomen was prepped with Chloraprep and draped in the sterile fashion. The patient was positioned in the supine position.  Access to the abdomen was achieved using a 5 mm  0 laparoscope thru a 5 mm trocar In the left upper Quadrant 2 fingerbreadths below the left subcostal margin using the Optiview technique given his morbid obesity. Pneumoperitoneum was smoothly established up to 15 mm of mercury. The laparoscope was advanced and the abdominal cavity was surveilled. The patient was then placed in reverse Trendelenburg.  An 11-mm port was placed in the subxiphoid position.  Two 5-mm ports were placed in the right upper quadrant. All skin incisions were infiltrated with a local anesthetic agent before making the incision and placing the trocars.   We positioned the patient in reverse Trendelenburg, tilted slightly to the patient's left.  The gallbladder was identified, the fundus grasped and retracted cephalad. Adhesions were lysed bluntly and with the electrocautery where indicated, taking care not to injure any adjacent organs or viscus. The infundibulum was grasped and retracted laterally, exposing the peritoneum overlying the triangle of Calot. This was then divided and exposed in a blunt fashion. A critical view of the cystic duct and cystic artery was obtained.  The cystic duct was clearly identified and bluntly dissected circumferentially. The cystic duct was ligated with a clip distally.   An incision was made in the cystic duct and the [facility] cholangiogram catheter introduced. The catheter was secured using a clip. A cholangiogram was then obtained which showed good visualization of the distal and proximal biliary tree with no sign of filling defects or obstruction.  Contrast flowed easily into the duodenum. The catheter was then removed.   The cystic duct was then ligated with clips and divided. The cystic artery which had been identified & dissected free was ligated with clips. After placing the first clip on the down side of the cystic artery there was some brisk bleeding from the artery distal to that clip. I was able to get hemostasis by placing 2 additional clips on  the down  side and then divided the artery.   The gallbladder was dissected from the liver bed in retrograde fashion with the electrocautery. The gallbladder was removed and placed in an Ecco sac.  The gallbladder and Ecco sac were then removed through the subxiphoid port site. The liver bed was irrigated and inspected. Hemostasis was achieved with the electrocautery. I reinspected the cystic artery stump and there was no bleeding. Copious irrigation was utilized and was repeatedly aspirated until clear.  A piece of [employer] surgical Jamelle Haring was placed in the gallbladder fossa. An endoclose was used to close the 11mm trocar site with a 0 vicryl using laparoscopic assistance.   We again inspected the right upper quadrant for hemostasis.  The subxiphoid closure was inspected and there was no air leak and nothing trapped within the closure.The 5mm optical entry site was re-inspected and there was no evidence of injury.  Pneumoperitoneum was released as we removed the trocars.  4-0 Monocryl was used to close the skin.   Benzoin, steri-strips, and clean dressings were applied. The patient was then extubated and brought to the recovery room in stable condition. Instrument, sponge, and needle counts were correct at closure and at the conclusion of the case.   Findings: Chronic Cholecystitis with Cholelithiasis; +critical view, +snow  Estimated Blood Loss: Minimal         Drains: none         Specimens: Gallbladder           Complications: None; patient tolerated the procedure well.         Disposition: PACU - hemodynamically stable.         Condition: stable  Mary Sella. Andrey Campanile, MD, FACS General, Bariatric, & Minimally Invasive Surgery [facility], [place]

## 2016-03-13 NOTE — Interval H&P Note (Signed)
History and Physical Interval Note:  03/13/2016 10:44 AM  Alphia MohWilliam C Nowling  has presented today for surgery, with the diagnosis of symptomatic cholelithiasis  The various methods of treatment have been discussed with the patient and family. After consideration of risks, benefits and other options for treatment, the patient has consented to  Procedure(s): LAPAROSCOPIC CHOLECYSTECTOMY WITH INTRAOPERATIVE CHOLANGIOGRAM (N/A) as a surgical intervention .  The patient's history has been reviewed, patient examined, no change in status, stable for surgery.  I have reviewed the patient's chart and labs.  Questions were answered to the patient's satisfaction.    Mary SellaEric M. [name], MD, FACS General, Bariatric, & Minimally Invasive Surgery [facility], [place]    [facility]ILSON,Ebrahim Deremer M

## 2016-03-13 NOTE — H&P (View-Only) (Signed)
Justin [name] 02/15/2016 3:24 PM Location: [facility] Patient #: [id number] DOB: 11/09/1970 Married / Language: English / Race: White Male  History of Present Illness (Fontaine Kossman M. Codi Kertz MD; 02/15/2016 3:51 PM) Patient words: GB.  The patient is a 45 year old male presenting status-post bariatric surgery. He underwent laparoscopic Roux-en-Y gastric bypass on May 26 2014. I last saw him in November 2016. His weight at time was 350 pounds. His initial visit weight was 408 pounds. His preoperative weight was 408 pounds. He saw our PA earlier in the month with complaints of right upper quadrant pain mainly occurring postprandially. It is associated with nausea. It radiates to his back and shoulder. It was worse with certain foods. It lasts anywhere from 15 min to 1 hour. It is different than a dumping episode. He underwent an ultrasound which showed fatty liver and a gallstone. He saw the nutritionist a few months ago and is scheduled for follow-up in September. He underwent arthroscopic knee surgery since his last visit with me a few months ago. He explains as why he's had some weight regain. Otherwise He states he is doing well. He denies any fever, chills, or vomiting. He denies any regurgitation. He has had no constipation. He is getting in around 64 ounces of liquid a day. He denies any chest pain, shortness of breath, calf pain. He does report some intermittent mid left thigh swelling with warmth at times. He reports that he is taking his supplements. He currently denies any hematuria or dysuria or flank pain. He is still using CPAP. He reports that he is taking his multivitamin, calcium, and B12. He is also back on something for his mood. He is taking gabapentin for left lower extremity radiculopathy  Review of systems-a comprehensive 12 point review of systems was performed. All systems are negative except for what is mentioned in the HPI   Problem List/Past  Medical (Ilianna Bown M Davianna Deutschman, MD; 02/15/2016 3:54 PM) LEFT THIGH PAIN (M79.652) SYMPTOMATIC CHOLELITHIASIS (K80.20) BODY MASS INDEX (BMI) OF 40.0 TO 49.9 SWELLING OF LEFT LOWER EXTREMITY (M79.89) ACHILLES TENDINITIS OF BOTH LOWER EXTREMITIES (M76.61, M76.62) LOW BACK PAIN, NON-SPECIFIC (M54.5) HIGH TRIGLYCERIDES (E78.1) OSA ON CPAP (G47.33) ESSENTIAL HYPERTENSION (I10) PREDIABETES (R73.03) HISTORY OF ROUX-EN-Y GASTRIC BYPASS (Z98.84) 05/26/2014 preop wt 408lb; initial visit wt 409 lb  Other Problems (Selia Wareing M Zayvion Stailey, MD; 02/15/2016 3:54 PM) Kidney Stone Other disease, cancer, significant illness  Past Surgical History (Suzy Kugel M Nobel Brar, MD; 02/15/2016 3:54 PM) Oral Surgery  Diagnostic Studies History (Dayyan Krist M Georgeanne Frankland, MD; 02/15/2016 3:54 PM) Colonoscopy never  Allergies (Ammie Eversole, LPN; 02/15/2016 3:25 PM) LaMICtal *ANTICONVULSANTS* TEGretol *ANTICONVULSANTS* Sulfa Antibiotics  Medication History (Shawan Tosh M Sheridan Gettel, MD; 02/15/2016 3:54 PM) Multivital-M (Oral) Active. Vitamin D (400UNIT Capsule, Oral) Active. Zinc (50MG Tablet, Oral) Active. Allopurinol (300MG Tablet, Oral) Active. Gabapentin (100MG Capsule, Oral) Active. Carvedilol (3.125MG Tablet, Oral) Active. Medications Reconciled Ondansetron (4MG Tablet Disint, 1 (one) Tablet Oral every six hours, as needed, Taken starting 02/15/2016) Active. TraMADol HCl (50MG Tablet, 1 (one) Tablet Oral every eight hours, as needed, Taken starting 02/15/2016) Active. Multivitamins (Oral) Active.  Social History (Sharunda Salmon M Suhailah Kwan, MD; 02/15/2016 3:54 PM) No alcohol use No drug use Caffeine use Carbonated beverages, Coffee, Tea. Tobacco use Never smoker.  Family History (Chelsea Nusz M Sunday Klos, MD; 02/15/2016 3:54 PM) Heart disease in male family member before age 65 Diabetes Mellitus Mother. Heart Disease Mother. Depression Father, Mother. Seizure disorder Son. Heart disease in male family member before age  55 Migraine   Headache Mother. Kidney Disease Father. Alcohol Abuse Father. Respiratory Condition Mother. Hypertension Father, Mother.    Vitals (Ammie Eversole LPN; 02/15/2016 3:25 PM) 02/15/2016 3:24 PM Weight: 336.8 lb Height: 67in Body Surface Area: 2.52 m Body Mass Index: 52.75 kg/m  Temp.: 97.1F(Oral)  Pulse: 86 (Regular)  BP: 142/72 (Sitting, Left Arm, Standard)      Physical Exam (Taelyr Jantz M. Mali Eppard MD; 02/15/2016 3:51 PM)  General Mental Status-Alert. General Appearance-Consistent with stated age. Hydration-Well hydrated. Voice-Normal. Note: morbid obese  Head and Neck Head-normocephalic, atraumatic with no lesions or palpable masses. Trachea-midline. Thyroid Gland Characteristics - normal size and consistency.  Eye Eyeball - Bilateral-Extraocular movements intact. Sclera/Conjunctiva - Bilateral-No scleral icterus.  Chest and Lung Exam Chest and lung exam reveals -quiet, even and easy respiratory effort with no use of accessory muscles and on auscultation, normal breath sounds, no adventitious sounds and normal vocal resonance. Inspection Chest Wall - Normal. Back - normal.  Breast - Did not examine.  Cardiovascular Cardiovascular examination reveals -normal heart sounds, regular rate and rhythm with no murmurs.  Abdomen Inspection Inspection of the abdomen reveals - No Hernias. Skin - Scar - Note: well healed trocar sites. Palpation/Percussion Palpation and Percussion of the abdomen reveal - Soft, Non Tender, No Rebound tenderness, No Rigidity (guarding) and No hepatosplenomegaly. Auscultation Auscultation of the abdomen reveals - Bowel sounds normal.  Peripheral Vascular Upper Extremity Palpation - Pulses bilaterally normal.  Neurologic Neurologic evaluation reveals -alert and oriented x 3 with no impairment of recent or remote memory. Mental Status-Normal.  Neuropsychiatric The patient's mood and  affect are described as -normal. Judgment and Insight-insight is appropriate concerning matters relevant to self.  Musculoskeletal Normal Exam - Left-Upper Extremity Strength Normal and Lower Extremity Strength Normal. Normal Exam - Right-Upper Extremity Strength Normal and Lower Extremity Strength Normal.  Lymphatic Head & Neck  General Head & Neck Lymphatics: Bilateral - Description - Normal. Axillary - Did not examine. Femoral & Inguinal - Did not examine.    Assessment & Plan (Brookes Craine M. Ayron Fillinger MD; 02/15/2016 3:54 PM)  HISTORY OF ROUX-EN-Y GASTRIC BYPASS (Z98.84) Story: preop wt 408lb; initial visit wt 409 lb Impression: He has had some weight regain following his surgery which is attributed to having the surgery back in April. Hopefully his increase in activity will help get some of the weight back off. We discussed the importance of ongoing physical activity. We also discussed the importance of good food choices. I think he has some improvement in that aspect along with his physical activity. I'm encouraged that he is doing the nutritionist regularly. He is overdue for bariatric lab surveillance. We will order this as part of his preoperative labs for upcoming cholecystectomy. A small component of his symptoms could also be due to dumping. We discussed dumping syndrome and ways to avoid it.  ESSENTIAL HYPERTENSION (I10) Impression: Stable  PREDIABETES (R73.03) Impression: Due for lab check  LEFT THIGH PAIN (M79.652) Impression: Now long-term. Per PCP  HIGH TRIGLYCERIDES (E78.1) Impression: Stable  OSA ON CPAP (G47.33) Impression: Stable  SYMPTOMATIC CHOLELITHIASIS (K80.20) Impression: I believe the patient's symptoms are consistent with gallbladder disease.  We discussed gallbladder disease. The patient was given educational material. We discussed non-operative and operative management. We discussed the signs & symptoms of acute cholecystitis  I discussed  laparoscopic cholecystectomy with IOC in detail. The patient was given educational material as well as diagrams detailing the procedure. We discussed the risks and benefits of a laparoscopic cholecystectomy including, but not limited to bleeding,   infection, injury to surrounding structures such as the intestine or liver, bile leak, retained gallstones, need to convert to an open procedure, prolonged diarrhea, blood clots such as DVT, common bile duct injury, anesthesia risks, and possible need for additional procedures. We discussed the typical post-operative recovery course. I explained that the likelihood of improvement of their symptoms is good.  The patient has elected to proceed with surgery. I gave him a prescription for Zofran and tramadol.  Current Plans You are being scheduled for surgery - Our schedulers will call you.  You should hear from our office's scheduling department within 5 working days about the location, date, and time of surgery. We try to make accommodations for patient's preferences in scheduling surgery, but sometimes the OR schedule or the surgeon's schedule prevents us from making those accommodations.  If you have not heard from our office ([phone]) in 5 working days, call the office and ask for your surgeon's nurse.  If you have other questions about your diagnosis, plan, or surgery, call the office and ask for your surgeon's nurse.  Pt Education - Pamphlet Given - Laparoscopic Gallbladder Surgery: discussed with patient and provided information. Started Ondansetron 4MG, 1 (one) Tablet every six hours, as needed, #20, 02/15/2016, No Refill. Started TraMADol HCl 50MG, 1 (one) Tablet every eight hours, as needed, #15, 02/15/2016, No Refill.  Elida Harbin M. Cullen Vanallen, MD, FACS General, Bariatric, & Minimally Invasive Surgery [facility] [place]  

## 2016-03-22 ENCOUNTER — Telehealth: Payer: Self-pay

## 2016-03-22 ENCOUNTER — Ambulatory Visit (INDEPENDENT_AMBULATORY_CARE_PROVIDER_SITE_OTHER): Payer: BLUE CROSS/BLUE SHIELD | Admitting: Family Medicine

## 2016-03-22 ENCOUNTER — Other Ambulatory Visit: Payer: Self-pay | Admitting: *Deleted

## 2016-03-22 ENCOUNTER — Encounter: Payer: Self-pay | Admitting: Family Medicine

## 2016-03-22 DIAGNOSIS — M7989 Other specified soft tissue disorders: Secondary | ICD-10-CM | POA: Diagnosis not present

## 2016-03-22 DIAGNOSIS — F411 Generalized anxiety disorder: Secondary | ICD-10-CM | POA: Diagnosis not present

## 2016-03-22 DIAGNOSIS — I10 Essential (primary) hypertension: Secondary | ICD-10-CM | POA: Diagnosis not present

## 2016-03-22 MED ORDER — GABAPENTIN 100 MG PO CAPS: 100.0000 mg | ORAL_CAPSULE | Freq: Every day | ORAL | 9 refills | Status: DC

## 2016-03-22 MED ORDER — LISINOPRIL 20 MG PO TABS: 20.0000 mg | ORAL_TABLET | Freq: Every day | ORAL | 3 refills | Status: DC

## 2016-03-22 NOTE — Assessment & Plan Note (Signed)
Improved.  Continue buspar

## 2016-03-22 NOTE — Patient Instructions (Addendum)
Will order an US the left leg.  To rule out a blood clot.  Keep the leg elevated all the time when sitting.  Will call you with the result  Start the lisinopril 20 mg daily.   Monitor your blood pressure and bring in next visit  Come back in 1 month for blood pressure check  Continue the buspar as you are  Great to see you

## 2016-03-22 NOTE — Assessment & Plan Note (Signed)
New.  Is at risk for dvt will check US.  No signs of cellultis.  Encouraged elevation

## 2016-03-22 NOTE — Progress Notes (Signed)
Subjective  Patient is presenting with the following illnesses  Left Leg Pain Pain in upper thigh is better he thinks due to gabapentin.   Now has pain in lower leg - feels tight and tender and he believes leg is more swollen than usual. Thinks this began before his recent surgery.  No shortness of breath or chest pain.   Does have history of cellulitis in this leg   Anxiety Improved on buspar.  GAD is 5.  Sleeping and worrying is better but not as good as he would like  HYPERTENSION Disease Monitoring  Home BP Monitoring (Severity) has been high at home and at other doctor visits Symptoms - Chest pain- no    Dyspnea- no Medications (Modifying factors) Compliance-  Coreg twice a day . Lightheadedness-  no  Edema- see above Timing - continuous  Duration - years ROS - See HPI  PMH Lab Review   Potassium  Date Value Ref Range Status  03/06/2016 4.1 3.5 - 5.1 mmol/L Final   Sodium  Date Value Ref Range Status  03/06/2016 140 135 - 145 mmol/L Final   Creat  Date Value Ref Range Status  12/09/2014 0.80 0.50 - 1.35 mg/dL Final   Creatinine, Ser  Date Value Ref Range Status  03/06/2016 0.84 0.61 - 1.24 mg/dL Final       Chief Complaint noted Review of Symptoms - see HPI PMH - Smoking status noted.     Objective Vital Signs reviewed Alert NAD  Obese Left leg - tender over lower medial calf.  Mild diffuse erythema does not look like cellulitis.  Is more swollen than right.  No Homans  Heart - Regular rate and rhythm.  No murmurs, gallops or rubs.    Lungs:  Normal respiratory effort, chest expands symmetrically. Lungs are clear to auscultation, no crackles or wheezes.     Assessments/Plans  No problem-specific Assessment & Plan notes found for this encounter.   See Encounter view if individual problem A/Ps not visible See after visit summary for details of patient instuctions

## 2016-03-22 NOTE — Assessment & Plan Note (Signed)
BP Readings from Last 3 Encounters:  03/22/16 (!) 152/94  03/13/16 (!) 150/91  03/06/16 (!) 144/96   Not at goal.  Will restart lisinopril which he was taking before his gastric bypass

## 2016-03-22 NOTE — Telephone Encounter (Signed)
Informed pt his vascular US is schedule for 10/5 at 10am at [name]. This was left on his personal VM.

## 2016-03-23 ENCOUNTER — Telehealth: Payer: Self-pay | Admitting: Family Medicine

## 2016-03-23 ENCOUNTER — Ambulatory Visit (HOSPITAL_COMMUNITY)
Admission: RE | Admit: 2016-03-23 | Discharge: 2016-03-23 | Disposition: A | Discharge status: Home / Self Care | Payer: BLUE CROSS/BLUE SHIELD | Source: Ambulatory Visit | Attending: Family Medicine | Admitting: Family Medicine

## 2016-03-23 DIAGNOSIS — M7989 Other specified soft tissue disorders: Secondary | ICD-10-CM | POA: Diagnosis not present

## 2016-03-23 NOTE — Progress Notes (Signed)
VASCULAR LAB PRELIMINARY  PRELIMINARY  PRELIMINARY  PRELIMINARY  Left lower extremity venous duplex completed.    Preliminary report:  Left:  No evidence of DVT, superficial thrombosis, or Baker's cyst.  Bazil Dhanani, RVS 03/23/2016, 10:47 AM

## 2016-03-23 NOTE — Telephone Encounter (Signed)
Will forward to MD to advise. Jazmin Hartsell,CMA  

## 2016-03-23 NOTE — Telephone Encounter (Signed)
[place] with the [facility] called with results. Pt was senn for left lower extremity venus exam for DVT.  Exam was negative. She is sending the pt home.  If there are additional instructions., please contact the pt

## 2016-03-24 NOTE — Telephone Encounter (Signed)
Called about his leg  NA  Left message to call if worsening (fever, redness) or not improving

## 2016-04-11 ENCOUNTER — Ambulatory Visit: Payer: BLUE CROSS/BLUE SHIELD | Admitting: Dietician

## 2016-04-13 ENCOUNTER — Ambulatory Visit: Payer: BLUE CROSS/BLUE SHIELD | Admitting: Dietician

## 2016-04-13 ENCOUNTER — Encounter: Payer: Self-pay | Admitting: Family Medicine

## 2016-04-17 DIAGNOSIS — G4733 Obstructive sleep apnea (adult) (pediatric): Secondary | ICD-10-CM | POA: Diagnosis not present

## 2016-04-18 MED ORDER — GABAPENTIN 100 MG PO CAPS: 300.0000 mg | ORAL_CAPSULE | Freq: Every day | ORAL | 1 refills | Status: DC

## 2016-04-19 ENCOUNTER — Ambulatory Visit (INDEPENDENT_AMBULATORY_CARE_PROVIDER_SITE_OTHER): Payer: BLUE CROSS/BLUE SHIELD | Admitting: Internal Medicine

## 2016-04-19 ENCOUNTER — Encounter: Payer: Self-pay | Admitting: Internal Medicine

## 2016-04-19 VITALS — BP 150/89 | HR 66 | Temp 97.7°F | Wt 339.2 lb

## 2016-04-19 DIAGNOSIS — J069 Acute upper respiratory infection, unspecified: Secondary | ICD-10-CM

## 2016-04-19 NOTE — Progress Notes (Addendum)
   [facility] Noralee CharsAsiyah Calyb Mcquarrie, MD Phone: [phone]  Reason For Visit: SDA for sore throat    # Monday morning with cough, congestion, sore throat.  - Woke up this morning with sweats - Patient states he has chest pain with cough. - Productive cough  - No fever. No nausea or vomiting - Taken over the counter desylm  - Intermittent ear pain on Monday  - No muscle aches  - No fevers, chills, nausea, or vomitting    Past Medical History Reviewed problem list.  Medications- reviewed and updated No additions to family history Social history- patient is a non-smoker  Objective: BP (!) 150/89 (BP Location: Left Arm, Patient Position: Sitting, Cuff Size: Normal)   Pulse 66   Temp 97.7 F (36.5 C) (Oral)   Wt (!) 339 lb 3.2 oz (153.9 kg)   BMI 50.09 kg/m  Gen: NAD, alert, cooperative with exam HEENT: Normal    Neck: No masses palpated. No lymphadenopathy    Ears: Tympanic membranes intact, normal light reflex, no erythema, no bulging    Nose: nasal turbinates erythematous     Throat: moist mucus membranes, no erythema Cardio: regular rate and rhythm, S1S2 heard, no murmurs appreciated Pulm: clear to auscultation bilaterally, no wheezes, rhonchi or rales   Assessment/Plan: See problem based a/p   Acute upper respiratory infection Well appearing adult with two day hx of cough and congestion consistent with URI. Patient with most medications at home already for symptomatic treatment. - Flonase daily for congestion  - tessalon perles and delysm as needed for cough

## 2016-04-19 NOTE — Patient Instructions (Signed)
Upper Respiratory Infection, Adult Most upper respiratory infections (URIs) are a viral infection of the air passages leading to the lungs. A URI affects the nose, throat, and upper air passages. The most common type of URI is nasopharyngitis and is typically referred to as "the common cold." URIs run their course and usually go away on their own. Most of the time, a URI does not require medical attention, but sometimes a bacterial infection in the upper airways can follow a viral infection. This is called a secondary infection. Sinus and middle ear infections are common types of secondary upper respiratory infections. Bacterial pneumonia can also complicate a URI. A URI can worsen asthma and chronic obstructive pulmonary disease (COPD). Sometimes, these complications can require emergency medical care and may be life threatening.  CAUSES Almost all URIs are caused by viruses. A virus is a type of germ and can spread from one person to another.  RISKS FACTORS You may be at risk for a URI if:   You smoke.   You have chronic heart or lung disease.  You have a weakened defense (immune) system.   You are very young or very old.   You have nasal allergies or asthma.  You work in crowded or poorly ventilated areas.  You work in health care facilities or schools. SIGNS AND SYMPTOMS  Symptoms typically develop 2-3 days after you come in contact with a cold virus. Most viral URIs last 7-10 days. However, viral URIs from the influenza virus (flu virus) can last 14-18 days and are typically more severe. Symptoms may include:   Runny or stuffy (congested) nose.   Sneezing.   Cough.   Sore throat.   Headache.   Fatigue.   Fever.   Loss of appetite.   Pain in your forehead, behind your eyes, and over your cheekbones (sinus pain).  Muscle aches.  DIAGNOSIS  Your health care provider may diagnose a URI by:  Physical exam.  Tests to check that your symptoms are not due to  another condition such as:  Strep throat.  Sinusitis.  Pneumonia.  Asthma. TREATMENT  A URI goes away on its own with time. It cannot be cured with medicines, but medicines may be prescribed or recommended to relieve symptoms. Medicines may help:  Reduce your fever.  Reduce your cough.  Relieve nasal congestion. HOME CARE INSTRUCTIONS   Take medicines only as directed by your health care provider.   Gargle warm saltwater or take cough drops to comfort your throat as directed by your health care provider.  Use a warm mist humidifier or inhale steam from a shower to increase air moisture. This may make it easier to breathe.  Drink enough fluid to keep your urine clear or pale yellow.   Eat soups and other clear broths and maintain good nutrition.   Rest as needed.   Return to work when your temperature has returned to normal or as your health care provider advises. You may need to stay home longer to avoid infecting others. You can also use a face mask and careful hand washing to prevent spread of the virus.  Increase the usage of your inhaler if you have asthma.   Do not use any tobacco products, including cigarettes, chewing tobacco, or electronic cigarettes. If you need help quitting, ask your health care provider. PREVENTION  The best way to protect yourself from getting a cold is to practice good hygiene.   Avoid oral or hand contact with people with cold   symptoms.   Wash your hands often if contact occurs.  There is no clear evidence that vitamin C, vitamin E, echinacea, or exercise reduces the chance of developing a cold. However, it is always recommended to get plenty of rest, exercise, and practice good nutrition.  SEEK MEDICAL CARE IF:   You are getting worse rather than better.   Your symptoms are not controlled by medicine.   You have chills.  You have worsening shortness of breath.  You have brown or red mucus.  You have yellow or brown nasal  discharge.  You have pain in your face, especially when you bend forward.  You have a fever.  You have swollen neck glands.  You have pain while swallowing.  You have white areas in the back of your throat. SEEK IMMEDIATE MEDICAL CARE IF:   You have severe or persistent:  Headache.  Ear pain.  Sinus pain.  Chest pain.  You have chronic lung disease and any of the following:  Wheezing.  Prolonged cough.  Coughing up blood.  A change in your usual mucus.  You have a stiff neck.  You have changes in your:  Vision.  Hearing.  Thinking.  Mood. MAKE SURE YOU:   Understand these instructions.  Will watch your condition.  Will get help right away if you are not doing well or get worse.   This information is not intended to replace advice given to you by your health care provider. Make sure you discuss any questions you have with your health care provider.   Document Released: 11/29/2000 Document Revised: 10/20/2014 Document Reviewed: 09/10/2013 Elsevier Interactive Patient Education 2016 Elsevier Inc.  

## 2016-04-19 NOTE — Assessment & Plan Note (Signed)
Well appearing adult with two day hx of cough and congestion consistent with URI. Patient with most medications at home already for symptomatic treatment. - Flonase daily for congestion  - tessalon perles and delysm as needed for cough

## 2016-04-21 ENCOUNTER — Ambulatory Visit (INDEPENDENT_AMBULATORY_CARE_PROVIDER_SITE_OTHER): Payer: BLUE CROSS/BLUE SHIELD | Admitting: Family Medicine

## 2016-04-21 ENCOUNTER — Telehealth: Payer: Self-pay | Admitting: Family Medicine

## 2016-04-21 DIAGNOSIS — J029 Acute pharyngitis, unspecified: Secondary | ICD-10-CM

## 2016-04-21 DIAGNOSIS — J069 Acute upper respiratory infection, unspecified: Secondary | ICD-10-CM

## 2016-04-21 LAB — POCT RAPID STREP A (OFFICE): Rapid Strep A Screen: NEGATIVE

## 2016-04-21 MED ORDER — GUAIFENESIN-CODEINE 100-10 MG/5ML PO SOLN: 10.0000 mL | Freq: Three times a day (TID) | ORAL | 0 refills | Status: DC | PRN

## 2016-04-21 NOTE — Progress Notes (Signed)
    Subjective: CC: cough, congestion. HPI: Patient is a 45 y.o. male with a past medical history of HTN presenting to clinic today for a same day appt for congestion and cough.  Patient noted sinus congestion and mild sore throat on Monday. Tuesday he had a cough.  He was seen on 04/19/16 for a same day appointment and was advised to take Flonase, Tessalon Perles, and Delsym as needed for cough. He notes that since his same day appt he's started having chills, a deeper cough with green sputum, and worsening sore throat He's also endorsing body aches and weakness which started last night. He also noted wheezing last night and this AM. His wife notes his glands are swollen. Subjective fevers.  He felt he had some SOB when he first woke up, sitting up made it better (pt has OSA on CPAP).  Intermittently had otalgia son Wednesday, none since them.  Notes chest pain with coughing.  Notes nasal and chest congestion in addition to rhinorrhea.   Used Mucinex Max this AM and is able to swallow a little better.   His son is special needs and has a respiratory infection and an ear infection. Patient has been taking Deslym and Tessalon Pereles which helped a little. Not taking Flonase.   Social History: non smoker  ROS: All other systems reviewed and are negative.  Past Medical History Patient Active Problem List   Diagnosis Date Noted  . Acute upper respiratory infection 04/19/2016  . Left leg swelling 03/22/2016  . Chondromalacia of left patella 10/04/2015  . Synovial plica of left knee 10/04/2015  . Left thigh pain 08/09/2015  . Rapid heart beat 12/09/2014  . Left knee pain 12/09/2014  . Morbid obesity with BMI of 50.0-59.9, adult (HCC) 02/04/2014  . Sleep apnea 08/08/2010  . VENTRAL HERNIA 04/06/2010  . Anxiety state 10/19/2008  . Gout 08/03/2008  . HYPERTENSION, BENIGN 08/03/2008    Medications- reviewed and updated  Objective: Office vital signs reviewed. There were no  vitals taken for this visit.   Physical Examination:  General: Awake, alert, well- nourished, NAD ENMT:  TMs intact, normal light reflex, no erythema, no bulging. Nasal turbinates moist. Clear drainage noted. MMM, Oropharynx with mild erythema. No tonsillar exudate/hypertrophy Eyes: Conjunctiva non-injected. PERRL.  Cardio: RRR, no m/r/g noted.  Pulm: No increased WOB.  CTAB, without wheezes, rhonchi or crackles noted.    Assessment/Plan: Acute upper respiratory infection Discussed that his symptoms are most likely secondary to a viral infection and that antibiotics are not currently indicated. Symptomatic treatment with guaifenesin-codeine solution at nighttime Patient to start Flonase, he states he has this at home just hasn't started it yet. Patient may continue Mucinex Max during the daytime as needed If he is not using Mucinex Max, may use ibuprofen or Tylenol as needed for myalgias Discussed the clinical course of viral infections. Discussed return precautions.   Orders Placed This Encounter  Procedures  . POCT rapid strep A    Meds ordered this encounter  Medications  . guaiFENesin-codeine 100-10 MG/5ML syrup    Sig: Take 10 mLs by mouth 3 (three) times daily as needed for cough.    Dispense:  118 mL    Refill:  0    Joanna Puffrystal S. Landyn Buckalew PGY-3, [facility]

## 2016-04-21 NOTE — Assessment & Plan Note (Signed)
Discussed that his symptoms are most likely secondary to a viral infection and that antibiotics are not currently indicated. Symptomatic treatment with guaifenesin-codeine solution at nighttime Patient to start Flonase, he states he has this at home just hasn't started it yet. Patient may continue Mucinex Max during the daytime as needed If he is not using Mucinex Max, may use ibuprofen or Tylenol as needed for myalgias Discussed the clinical course of viral infections. Discussed return precautions.

## 2016-04-21 NOTE — Patient Instructions (Signed)
It was nice to meet you. Your strep test today was negative. I suspect that this is a viral infection, which cannot be improved with antibiotics. At this point, the best option is for symptomatically management. I have prescribed a cough syrup that can help with cough and sleep. It can make you drowsy. Please do not mix this with any other pain medications that you may have at home. You can also use Flonase twice daily as needed until your symptoms resolve, then if you like, once daily after that.  If your symptoms fail to improve after one week or you note significant improvement and then worsening, follow up with us in clinic.    Upper Respiratory Infection, Adult Most upper respiratory infections (URIs) are a viral infection of the air passages leading to the lungs. A URI affects the nose, throat, and upper air passages. The most common type of URI is nasopharyngitis and is typically referred to as "the common cold." URIs run their course and usually go away on their own. Most of the time, a URI does not require medical attention, but sometimes a bacterial infection in the upper airways can follow a viral infection. This is called a secondary infection. Sinus and middle ear infections are common types of secondary upper respiratory infections. Bacterial pneumonia can also complicate a URI. A URI can worsen asthma and chronic obstructive pulmonary disease (COPD). Sometimes, these complications can require emergency medical care and may be life threatening.  CAUSES Almost all URIs are caused by viruses. A virus is a type of germ and can spread from one person to another.  RISKS FACTORS You may be at risk for a URI if:   You smoke.   You have chronic heart or lung disease.  You have a weakened defense (immune) system.   You are very young or very old.   You have nasal allergies or asthma.  You work in crowded or poorly ventilated areas.  You work in health care facilities or  schools. SIGNS AND SYMPTOMS  Symptoms typically develop 2-3 days after you come in contact with a cold virus. Most viral URIs last 7-10 days. However, viral URIs from the influenza virus (flu virus) can last 14-18 days and are typically more severe. Symptoms may include:   Runny or stuffy (congested) nose.   Sneezing.   Cough.   Sore throat.   Headache.   Fatigue.   Fever.   Loss of appetite.   Pain in your forehead, behind your eyes, and over your cheekbones (sinus pain).  Muscle aches.  DIAGNOSIS  Your health care provider may diagnose a URI by:  Physical exam.  Tests to check that your symptoms are not due to another condition such as:  Strep throat.  Sinusitis.  Pneumonia.  Asthma. TREATMENT  A URI goes away on its own with time. It cannot be cured with medicines, but medicines may be prescribed or recommended to relieve symptoms. Medicines may help:  Reduce your fever.  Reduce your cough.  Relieve nasal congestion. HOME CARE INSTRUCTIONS   Take medicines only as directed by your health care provider.   Gargle warm saltwater or take cough drops to comfort your throat as directed by your health care provider.  Use a warm mist humidifier or inhale steam from a shower to increase air moisture. This may make it easier to breathe.  Drink enough fluid to keep your urine clear or pale yellow.   Eat soups and other clear broths and maintain  good nutrition.   Rest as needed.   Return to work when your temperature has returned to normal or as your health care provider advises. You may need to stay home longer to avoid infecting others. You can also use a face mask and careful hand washing to prevent spread of the virus.  Increase the usage of your inhaler if you have asthma.   Do not use any tobacco products, including cigarettes, chewing tobacco, or electronic cigarettes. If you need help quitting, ask your health care provider. PREVENTION    The best way to protect yourself from getting a cold is to practice good hygiene.   Avoid oral or hand contact with people with cold symptoms.   Wash your hands often if contact occurs.  There is no clear evidence that vitamin C, vitamin E, echinacea, or exercise reduces the chance of developing a cold. However, it is always recommended to get plenty of rest, exercise, and practice good nutrition.  SEEK MEDICAL CARE IF:   You are getting worse rather than better.   Your symptoms are not controlled by medicine.   You have chills.  You have worsening shortness of breath.  You have brown or red mucus.  You have yellow or brown nasal discharge.  You have pain in your face, especially when you bend forward.  You have a fever.  You have swollen neck glands.  You have pain while swallowing.  You have white areas in the back of your throat. SEEK IMMEDIATE MEDICAL CARE IF:   You have severe or persistent:  Headache.  Ear pain.  Sinus pain.  Chest pain.  You have chronic lung disease and any of the following:  Wheezing.  Prolonged cough.  Coughing up blood.  A change in your usual mucus.  You have a stiff neck.  You have changes in your:  Vision.  Hearing.  Thinking.  Mood. MAKE SURE YOU:   Understand these instructions.  Will watch your condition.  Will get help right away if you are not doing well or get worse.   This information is not intended to replace advice given to you by your health care provider. Make sure you discuss any questions you have with your health care provider.   Document Released: 11/29/2000 Document Revised: 10/20/2014 Document Reviewed: 09/10/2013 Elsevier Interactive Patient Education [employer]2016 Elsevier Inc.

## 2016-04-24 ENCOUNTER — Encounter: Payer: BLUE CROSS/BLUE SHIELD | Attending: General Surgery | Admitting: Dietician

## 2016-04-24 ENCOUNTER — Encounter: Payer: Self-pay | Admitting: Dietician

## 2016-04-24 DIAGNOSIS — Z713 Dietary counseling and surveillance: Secondary | ICD-10-CM | POA: Diagnosis not present

## 2016-04-24 DIAGNOSIS — Z6841 Body Mass Index (BMI) 40.0 and over, adult: Secondary | ICD-10-CM

## 2016-04-24 NOTE — Patient Instructions (Addendum)
Goals:  Start taking Bariatric Advantage multivitamins  Get back into exercise routine  At least 3x a week as tolerated

## 2016-04-24 NOTE — Progress Notes (Signed)
  Follow-up visit: 2 years Post-Operative RYGB Surgery  Medical Nutrition Therapy:  Appt start time: 1140 end time: 1200  Primary concerns today: Post-operative Bariatric Surgery Nutrition Management.  Justin [name] returns today having lost 2 pounds. Recently had his gallbladder removed about a month ago and has not been able to exercise as much. Before his cholecystectomy, he had been walking up to 2 miles a day and had gotten down to 325 lbs. Ordered a portable recumbent exercise bike. Planning to use the pre op diet as a guide. No vomiting or diarrhea.   Supplements provided and patient instructed on proper use: Bariatric Advantage multivitamin (1 bottle) Lot#: [id number] Exp: 04/2017  Surgery date: 05/26/2014 Surgery type: RYGB [facility] weight at [facility]: 412 on 02/28/14 (highest weight 470 lbs per patient) Weight today: 334.6 lbs Weight change: 2 lbs lost Total weight lost: 135.4 lbs Weight loss goal: 280 lbs to have bone spur surgery in his feet  Preferred Learning Style:   No preference indicated   Learning Readiness:   Ready  24-hr recall: B (AM): 2 scrambled eggs, 1 pieces [place]key bacon, 1 piece toast Snk (AM): L (PM): deli meat or hamburger meat or fish/steak and salad Snk (PM): P3 pack or Atkins protein shake (13-15g) D (PM): 3-4 oz meat with vegetable or salad (21-28g) Snk (PM): sometimes a P3 pack (13g)  Fluid intake: 64 oz per day mostly water with sugar free flavoring, protein shake, Powerade zero, decaf coffee with half and half and Splenda Estimated total protein intake: 90-105 g  Medications: see list Supplementation: taking, [place] chewables, zinc, vitamin D, and Calcium  Using straws: No Drinking while eating: trying not to Hair loss: yes  Carbonated beverages: No N/V/D/C: none Dumping syndrome: yes after drinking too fast and too soon after eating  Recent physical activity:  none  Progress Towards Goal(s):  In progress.  Handout provided: pre op  diet   Nutritional Diagnosis:  [place]-3.3 Overweight/obesity related to past poor dietary habits and physical inactivity as evidenced by patient w/ recent RYGB surgery following dietary guidelines for continued weight loss.    Intervention:  Nutrition education/diet reinforcement  Teaching Method Utilized:  Visual Auditory Hands on  Barriers to learning/adherence to lifestyle change: none  Demonstrated degree of understanding via:  Teach Back   Monitoring/Evaluation:  Dietary intake, exercise, and body weight. Follow up in 6 months.

## 2016-04-25 ENCOUNTER — Telehealth: Payer: Self-pay | Admitting: *Deleted

## 2016-04-25 NOTE — Telephone Encounter (Addendum)
Pt's wife, Wille CelesteJanie request pain medication to get pt to his 05/09/2016 appt with Dr. Al CorpusHyatt. 05/02/2016-Pt's wife, Asher MuirJamie called states left message last week about pain medication to get pt to his 05/09/2016 appt. 05/03/2016-Informed pt's wife she [place] the rx [place] office.

## 2016-04-26 ENCOUNTER — Encounter: Payer: Self-pay | Admitting: Family Medicine

## 2016-04-26 ENCOUNTER — Ambulatory Visit (INDEPENDENT_AMBULATORY_CARE_PROVIDER_SITE_OTHER): Payer: BLUE CROSS/BLUE SHIELD | Admitting: Family Medicine

## 2016-04-26 VITALS — BP 140/88 | HR 64 | Temp 97.9°F | Wt 337.0 lb

## 2016-04-26 DIAGNOSIS — H6692 Otitis media, unspecified, left ear: Secondary | ICD-10-CM

## 2016-04-26 DIAGNOSIS — F411 Generalized anxiety disorder: Secondary | ICD-10-CM | POA: Diagnosis not present

## 2016-04-26 MED ORDER — AZITHROMYCIN 250 MG PO TABS: ORAL_TABLET | ORAL | 0 refills | Status: DC

## 2016-04-26 NOTE — Assessment & Plan Note (Addendum)
Worsened mildly.  See GAD.  Will observe hopefully will improve after over illness.  Discussed increasing his buspar if not Continue exercise as able

## 2016-04-26 NOTE — Progress Notes (Signed)
Subjective  Patient is presenting with the following illnesses  Left Ear Pain - for last 2 days.  Worsening.  Has had URI symptoms with purulent sputum and ST for last week.  No fever or shortness of breath or rash.  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  See GAD for symptoms.   No depression and he feels things will improve when less ill   Chief Complaint noted Review of Symptoms - see HPI PMH - Smoking status noted.     Objective Vital Signs reviewed Left ear - TM red and cloudy Lungs - distant breath sounds trace occsl wheeze Heart - Regular rate and rhythm.  No murmurs, gallops or rubs.    Mouth - no lesions, mucous membranes are moist, no decaying teeth   Psych:  Cognition and judgment appear intact. Alert, communicative  and cooperative with normal attention span and concentration. No apparent delusions, illusions, hallucinations     Assessments/Plans  No problem-specific Assessment & Plan notes found for this encounter.  Ear Pain - consistent with  OTM.  Will treat with antibiotics   See Encounter view if individual problem A/Ps not visible See after visit summary for details of patient instuctions

## 2016-04-26 NOTE — Patient Instructions (Signed)
Good to see you today!  Thanks for coming in.  Take all the azithromycin for the ear infection  Your congstion should be better in about 5-6 days but the cough might last another few weeks  If you get a high fever you should call us

## 2016-05-02 ENCOUNTER — Encounter: Payer: Self-pay | Admitting: Podiatry

## 2016-05-02 NOTE — Telephone Encounter (Signed)
OK to refill

## 2016-05-03 MED ORDER — OXYCODONE-ACETAMINOPHEN 10-325 MG PO TABS: 1.0000 | ORAL_TABLET | Freq: Four times a day (QID) | ORAL | 0 refills | Status: DC | PRN

## 2016-05-09 ENCOUNTER — Encounter: Payer: Self-pay | Admitting: Podiatry

## 2016-05-09 ENCOUNTER — Ambulatory Visit (INDEPENDENT_AMBULATORY_CARE_PROVIDER_SITE_OTHER): Payer: BLUE CROSS/BLUE SHIELD | Admitting: Podiatry

## 2016-05-09 DIAGNOSIS — M722 Plantar fascial fibromatosis: Secondary | ICD-10-CM | POA: Diagnosis not present

## 2016-05-09 DIAGNOSIS — M7661 Achilles tendinitis, right leg: Secondary | ICD-10-CM | POA: Diagnosis not present

## 2016-05-09 DIAGNOSIS — M7662 Achilles tendinitis, left leg: Secondary | ICD-10-CM | POA: Diagnosis not present

## 2016-05-09 NOTE — Progress Notes (Signed)
Mr. Justin [name] presents today with a chief complaint of painful Achilles tendons bilaterally. He also presents for follow-up of plantar fasciitis of the right heel which is doing much better.  Objective: Vital signs are stable he's alert and oriented 3. Pulses are palpable. He has tenderness along the distalmost and medialmost aspect of the insertion of the Achilles tendon. He has pain on palpation medial continued tubercles bilateral.  Assessment: Plantar fasciitis right foot. Achilles tendinitis distal medial aspect.  Plan: I injected dexamethasone 2. maximal tenderness and medial posterior foot making sure not to inject into the tendon itself. 2 mg of dexamethasone was injected. I also injected the right heel with 20 mg Kenalog of plantar fascia calcaneal insertion site.

## 2016-05-25 DIAGNOSIS — M25562 Pain in left knee: Secondary | ICD-10-CM | POA: Diagnosis not present

## 2016-05-25 DIAGNOSIS — M94262 Chondromalacia, left knee: Secondary | ICD-10-CM | POA: Diagnosis not present

## 2016-05-29 ENCOUNTER — Encounter: Payer: Self-pay | Admitting: Podiatry

## 2016-05-30 ENCOUNTER — Telehealth: Payer: Self-pay | Admitting: *Deleted

## 2016-05-30 MED ORDER — OXYCODONE-ACETAMINOPHEN 10-325 MG PO TABS: 1.0000 | ORAL_TABLET | Freq: Four times a day (QID) | ORAL | 0 refills | Status: DC | PRN

## 2016-05-30 NOTE — Telephone Encounter (Signed)
Pt request refill of Oxycodone. Dr. Al CorpusHyatt refilled as previously, and ordered pt to make an appt. I informed pt through reply to email.

## 2016-06-21 ENCOUNTER — Other Ambulatory Visit: Payer: Self-pay | Admitting: Family Medicine

## 2016-06-29 ENCOUNTER — Encounter: Payer: Self-pay | Admitting: Podiatry

## 2016-06-29 ENCOUNTER — Ambulatory Visit (INDEPENDENT_AMBULATORY_CARE_PROVIDER_SITE_OTHER): Payer: BLUE CROSS/BLUE SHIELD | Admitting: Podiatry

## 2016-06-29 DIAGNOSIS — M7661 Achilles tendinitis, right leg: Secondary | ICD-10-CM

## 2016-06-29 DIAGNOSIS — M722 Plantar fascial fibromatosis: Secondary | ICD-10-CM | POA: Diagnosis not present

## 2016-06-29 DIAGNOSIS — M7662 Achilles tendinitis, left leg: Secondary | ICD-10-CM | POA: Diagnosis not present

## 2016-06-29 MED ORDER — OXYCODONE-ACETAMINOPHEN 10-325 MG PO TABS: 1.0000 | ORAL_TABLET | ORAL | 0 refills | Status: DC | PRN

## 2016-06-29 NOTE — Progress Notes (Signed)
Mr. Justin [name] presents today with chief complaint of right plantar heel pain and posterior left heel pain.  Objective: Vital signs are stable he is alert and oriented 3 pulses are palpable bilateral. Continues to lose weight from his bypass. He has pain on palpation medial calcaneal tubercle of the right heel.  Palpation posterior aspect of the Achilles left.  Assessment: Fasciitis right Achilles tendinitis insertional in nature left.  Plan: I injected both areas today Kenalog and local anesthetic to the right foot dexamethasone to the left foot. Relative prescription for a supply of Percocet.

## 2016-07-04 ENCOUNTER — Ambulatory Visit: Payer: BLUE CROSS/BLUE SHIELD | Admitting: Podiatry

## 2016-07-27 DIAGNOSIS — M25562 Pain in left knee: Secondary | ICD-10-CM | POA: Diagnosis not present

## 2016-08-01 ENCOUNTER — Other Ambulatory Visit: Payer: Self-pay | Admitting: Family Medicine

## 2016-08-03 DIAGNOSIS — G4733 Obstructive sleep apnea (adult) (pediatric): Secondary | ICD-10-CM | POA: Diagnosis not present

## 2016-08-16 ENCOUNTER — Other Ambulatory Visit: Payer: Self-pay | Admitting: Family Medicine

## 2016-08-24 DIAGNOSIS — M25562 Pain in left knee: Secondary | ICD-10-CM | POA: Diagnosis not present

## 2016-08-24 DIAGNOSIS — G5712 Meralgia paresthetica, left lower limb: Secondary | ICD-10-CM | POA: Diagnosis not present

## 2016-08-24 DIAGNOSIS — M25552 Pain in left hip: Secondary | ICD-10-CM | POA: Diagnosis not present

## 2016-09-27 ENCOUNTER — Encounter: Payer: Self-pay | Admitting: Family Medicine

## 2016-09-27 ENCOUNTER — Ambulatory Visit (INDEPENDENT_AMBULATORY_CARE_PROVIDER_SITE_OTHER): Payer: BLUE CROSS/BLUE SHIELD | Admitting: Family Medicine

## 2016-09-27 DIAGNOSIS — F411 Generalized anxiety disorder: Secondary | ICD-10-CM | POA: Diagnosis not present

## 2016-09-27 DIAGNOSIS — Z6841 Body Mass Index (BMI) 40.0 and over, adult: Secondary | ICD-10-CM | POA: Diagnosis not present

## 2016-09-27 DIAGNOSIS — J302 Other seasonal allergic rhinitis: Secondary | ICD-10-CM

## 2016-09-27 DIAGNOSIS — M79652 Pain in left thigh: Secondary | ICD-10-CM | POA: Diagnosis not present

## 2016-09-27 DIAGNOSIS — J309 Allergic rhinitis, unspecified: Secondary | ICD-10-CM | POA: Insufficient documentation

## 2016-09-27 DIAGNOSIS — I10 Essential (primary) hypertension: Secondary | ICD-10-CM | POA: Diagnosis not present

## 2016-09-27 MED ORDER — FLUTICASONE PROPIONATE 50 MCG/ACT NA SUSP: 2.0000 | Freq: Every day | NASAL | 6 refills | Status: DC

## 2016-09-27 MED ORDER — BUSPIRONE HCL 15 MG PO TABS: 15.0000 mg | ORAL_TABLET | Freq: Two times a day (BID) | ORAL | 3 refills | Status: DC

## 2016-09-27 NOTE — Assessment & Plan Note (Signed)
Worsened.  See after visit summary  

## 2016-09-27 NOTE — Assessment & Plan Note (Signed)
Improved with increased gabapentin

## 2016-09-27 NOTE — Assessment & Plan Note (Signed)
Consistent with seasonal allergy.  Trial of nasal steroids

## 2016-09-27 NOTE — Progress Notes (Signed)
Subjective  Patient is presenting with the following illnesses  HYPERTENSION Disease Monitoring  Home BP Monitoring (Severity) not checking Symptoms - Chest pain- no    Dyspnea- no Medications (Modifying factors) Compliance-  daily. Lightheadedness-  no  Edema- no Timing - continuous  Duration - years ROS - See HPI  ALLERGIES Stuffy nose with congestion and full headache feeling in sinuses at pm.  Taking otc clariten. No fever or blood discharge.  Did have recent dental surgery.  Has seasonal allergies  BACK PAIN  Back pain began several days ago. Pain is described as aching on right side.  Started after gofling and bowling. Patient has tried nothing. Pain radiates no. History of trauma or injury: no Prior history of similar pain: no History of cancer: no Weak immune system:  no History of IV drug use: no History of steroid use: no  Symptoms Incontinence of bowel or bladder:  no Numbness of leg: not new has neuropathy that is better Fever: no Rest or Night pain: no Weight Loss:  no Rash: no  ROS see HPI Smoking Status noted.   PMH Lab Review   Potassium  Date Value Ref Range Status  03/06/2016 4.1 3.5 - 5.1 mmol/L Final   Sodium  Date Value Ref Range Status  03/06/2016 140 135 - 145 mmol/L Final   Creat  Date Value Ref Range Status  12/09/2014 0.80 0.50 - 1.35 mg/dL Final   Creatinine, Ser  Date Value Ref Range Status  03/06/2016 0.84 0.61 - 1.24 mg/dL Final      Chief Complaint noted Review of Symptoms - see HPI PMH - Smoking status noted.     Objective Vital Signs reviewed Back - tender focally left mid paraspinous muscles.  No vertebral body tenderness, No mass Normal lower extrem strength Nose:  External nasal examination shows no deformity or inflammation. Nasal mucosa are red and moist without lesions or exudates. No septal dislocation or dislocation.No obstruction to airflow. Pannus - several over abdomen area.  No skin breakdown or mass or  focal swelling     Assessments/Plans  No problem-specific Assessment & Plan notes found for this encounter.  Back pain - consistent with musculoskeletal  Strain.  No red flags.  See after visit summary   See Encounter view if individual problem A/Ps not visible See after visit summary for details of patient instuctions

## 2016-09-27 NOTE — Patient Instructions (Addendum)
Good to see you today!  Thanks for coming in.  Anxiety  Take buspar 15 mg in the AM and 7.5 mg in the PM for one week if no side effects then go to 15 mg twice a day  For the nose  Continue the loratadine 10 mg every day  Add flonase nasal spray  For the back  Muscle sprain - use tylenol as needed with heat  Warm up and stretch before sports  Continue to work on the weight - exercise but most imprtantly is the diet  Come back in 3-6 months

## 2016-09-27 NOTE — Assessment & Plan Note (Signed)
At goal continue medications  ?

## 2016-09-27 NOTE — Assessment & Plan Note (Signed)
Worsened mildly according to patient and wife.  Will increase buspar

## 2016-10-05 ENCOUNTER — Ambulatory Visit (INDEPENDENT_AMBULATORY_CARE_PROVIDER_SITE_OTHER): Payer: BLUE CROSS/BLUE SHIELD | Admitting: Podiatry

## 2016-10-05 ENCOUNTER — Encounter: Payer: Self-pay | Admitting: Podiatry

## 2016-10-05 DIAGNOSIS — M722 Plantar fascial fibromatosis: Secondary | ICD-10-CM | POA: Diagnosis not present

## 2016-10-05 DIAGNOSIS — M7662 Achilles tendinitis, left leg: Secondary | ICD-10-CM

## 2016-10-05 DIAGNOSIS — M7661 Achilles tendinitis, right leg: Secondary | ICD-10-CM | POA: Diagnosis not present

## 2016-10-05 MED ORDER — OXYCODONE-ACETAMINOPHEN 10-325 MG PO TABS: 1.0000 | ORAL_TABLET | ORAL | 0 refills | Status: DC | PRN

## 2016-10-05 NOTE — Progress Notes (Signed)
He presents today for a follow-up of his retrocalcaneal bursitis and his Achilles tendinitis. As well as his plantar fasciitis on the left foot. He states his wife is doing pretty well. Uterus requesting a refill on his Percocet.  Objective: Vital signs are stable he is alert and oriented 3. Pulses are palpable. His pain on palpation posterior and inferior calcaneus left.  Assessment: Bursitis Achilles tendinitis left foot. Plantar fasciitis left foot.  Plan: Discussed etiology pathology conservative versus surgical therapies refilled his Percocet. Injected posterior aspect of his heel and the inferior aspect of his left heel as well.

## 2016-10-16 DIAGNOSIS — G5712 Meralgia paresthetica, left lower limb: Secondary | ICD-10-CM | POA: Diagnosis not present

## 2016-10-16 DIAGNOSIS — M25521 Pain in right elbow: Secondary | ICD-10-CM | POA: Diagnosis not present

## 2016-10-18 ENCOUNTER — Other Ambulatory Visit: Payer: Self-pay

## 2016-10-18 DIAGNOSIS — S46211A Strain of muscle, fascia and tendon of other parts of biceps, right arm, initial encounter: Secondary | ICD-10-CM

## 2016-10-19 ENCOUNTER — Ambulatory Visit: Payer: BLUE CROSS/BLUE SHIELD | Admitting: Skilled Nursing Facility1

## 2016-10-23 ENCOUNTER — Ambulatory Visit: Payer: BLUE CROSS/BLUE SHIELD | Admitting: Dietician

## 2016-10-23 ENCOUNTER — Other Ambulatory Visit: Payer: Self-pay | Admitting: Family Medicine

## 2016-10-30 ENCOUNTER — Other Ambulatory Visit: Payer: Self-pay | Admitting: Family Medicine

## 2016-10-31 ENCOUNTER — Ambulatory Visit
Admission: RE | Admit: 2016-10-31 | Discharge: 2016-10-31 | Disposition: A | Discharge status: Home / Self Care | Payer: BLUE CROSS/BLUE SHIELD | Source: Ambulatory Visit | Attending: Orthopedic Surgery | Admitting: Orthopedic Surgery

## 2016-10-31 DIAGNOSIS — S59901A Unspecified injury of right elbow, initial encounter: Secondary | ICD-10-CM | POA: Diagnosis not present

## 2016-10-31 DIAGNOSIS — S46211A Strain of muscle, fascia and tendon of other parts of biceps, right arm, initial encounter: Secondary | ICD-10-CM

## 2016-11-06 DIAGNOSIS — M25522 Pain in left elbow: Secondary | ICD-10-CM | POA: Diagnosis not present

## 2016-11-16 ENCOUNTER — Encounter: Payer: BLUE CROSS/BLUE SHIELD | Attending: General Surgery | Admitting: Skilled Nursing Facility1

## 2016-11-16 ENCOUNTER — Encounter: Payer: Self-pay | Admitting: Skilled Nursing Facility1

## 2016-11-16 DIAGNOSIS — Z713 Dietary counseling and surveillance: Secondary | ICD-10-CM | POA: Insufficient documentation

## 2016-11-16 DIAGNOSIS — Z6841 Body Mass Index (BMI) 40.0 and over, adult: Secondary | ICD-10-CM | POA: Insufficient documentation

## 2016-11-16 NOTE — Patient Instructions (Signed)
-  Iron:30-45mg   -Thiamin: at least 12mg    -Try making your own vegetable chips with olive oil and salt: zucchini or kale or maybe a carrot  -Great job getting back into walking!!  -Use walking and video games for stress relief   -Thank about the stress and food connection   -Try to wait 15 minutes before you eat and 30 minutes after you eat before you drink
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
## 2016-11-30 DIAGNOSIS — M7711 Lateral epicondylitis, right elbow: Secondary | ICD-10-CM | POA: Diagnosis not present

## 2016-11-30 DIAGNOSIS — M25562 Pain in left knee: Secondary | ICD-10-CM | POA: Diagnosis not present

## 2016-11-30 DIAGNOSIS — M25561 Pain in right knee: Secondary | ICD-10-CM | POA: Diagnosis not present

## 2016-12-07 DIAGNOSIS — M25561 Pain in right knee: Secondary | ICD-10-CM | POA: Diagnosis not present

## 2016-12-14 ENCOUNTER — Other Ambulatory Visit: Payer: Self-pay | Admitting: Family Medicine

## 2016-12-21 ENCOUNTER — Ambulatory Visit (INDEPENDENT_AMBULATORY_CARE_PROVIDER_SITE_OTHER): Payer: BLUE CROSS/BLUE SHIELD | Admitting: Podiatry

## 2016-12-21 ENCOUNTER — Encounter: Payer: Self-pay | Admitting: Podiatry

## 2016-12-21 DIAGNOSIS — M7661 Achilles tendinitis, right leg: Secondary | ICD-10-CM | POA: Diagnosis not present

## 2016-12-21 DIAGNOSIS — M7662 Achilles tendinitis, left leg: Secondary | ICD-10-CM | POA: Diagnosis not present

## 2016-12-21 MED ORDER — OXYCODONE-ACETAMINOPHEN 10-325 MG PO TABS: 1.0000 | ORAL_TABLET | ORAL | 0 refills | Status: DC | PRN

## 2016-12-21 NOTE — Progress Notes (Signed)
He presents today for follow-up of his Achilles tendinitis bilaterally states this really been hurting me over the last few weeks. He sees states this seems to be moving as he points to the posterior medial aspect of the ankle not exactly where the tendon was before he states.  Objective: Vital signs are stable he is alert and oriented 3 continues to lose weight bariatric surgery. Currently he has pain on palpation posterior medial aspect of the bilateral heels slightly more medial than recently noted.  Assessment: Achilles tendinitis insertion posterior medial aspect.  Plan: I injected these areas today with dexamethasone and local anesthetic Road prescription for Percocet follow-up with him in 3 months if necessary.

## 2016-12-28 DIAGNOSIS — L03116 Cellulitis of left lower limb: Secondary | ICD-10-CM | POA: Diagnosis not present

## 2016-12-29 DIAGNOSIS — M79662 Pain in left lower leg: Secondary | ICD-10-CM | POA: Diagnosis not present

## 2017-01-29 ENCOUNTER — Other Ambulatory Visit: Payer: Self-pay | Admitting: Family Medicine

## 2017-01-31 ENCOUNTER — Ambulatory Visit (INDEPENDENT_AMBULATORY_CARE_PROVIDER_SITE_OTHER): Payer: BLUE CROSS/BLUE SHIELD | Admitting: Podiatry

## 2017-01-31 ENCOUNTER — Ambulatory Visit (INDEPENDENT_AMBULATORY_CARE_PROVIDER_SITE_OTHER): Payer: BLUE CROSS/BLUE SHIELD

## 2017-01-31 DIAGNOSIS — M7752 Other enthesopathy of left foot: Secondary | ICD-10-CM

## 2017-01-31 DIAGNOSIS — M779 Enthesopathy, unspecified: Principal | ICD-10-CM

## 2017-01-31 DIAGNOSIS — M778 Other enthesopathies, not elsewhere classified: Secondary | ICD-10-CM

## 2017-01-31 MED ORDER — TRAMADOL HCL 50 MG PO TABS: 50.0000 mg | ORAL_TABLET | Freq: Three times a day (TID) | ORAL | 0 refills | Status: DC | PRN

## 2017-02-01 MED ORDER — BETAMETHASONE SOD PHOS & ACET 6 (3-3) MG/ML IJ SUSP: 3.0000 mg | Freq: Once | INTRAMUSCULAR | Status: DC

## 2017-02-01 NOTE — Progress Notes (Signed)
   HPI: 46 year old male presents to the office today for a new complaint of pain to the dorsum of the left foot. Patient states his pain is been ongoing for approximately 2 weeks now. Patient states the onset was gradual. Patient denies trauma. Patient is currently unable to ambulate without significant amount of pain to the left foot and anterior ankle.   Physical Exam: General: The patient is alert and oriented x3 in no acute distress.  Dermatology: Skin is warm, dry and supple bilateral lower extremities. Negative for open lesions or macerations.  Vascular: Palpable pedal pulses bilaterally. No edema or erythema noted. Capillary refill within normal limits.  Neurological: Epicritic and protective threshold grossly intact bilaterally.   Musculoskeletal Exam: Range of motion within normal limits to all pedal and ankle joints bilateral. Muscle strength 5/5 in all groups bilateral. Significant amount of pain and tenderness noted to the dorsum of the left foot and anterior ankle likely consistent with a extensor tendinitis.  Radiographic Exam:  Normal osseous mineralization. Joint spaces preserved. No fracture/dislocation/boney destruction.    Assessment: 1. Extensor tendinitis left foot   Plan of Care:  1. Patient was evaluated. X-rays reviewed today 2. Injection of 0.5 mL Celestone Soluspan injected into the extensor tendon sheath left foot 3. Immobilization cam boot dispensed. Recommend the patient be weightbearing in the cam boot 4 weeks. Compression anklet dispensed 4. Patient cannot tolerate oral NSAIDs due to gastric bypass surgery several years ago 5. Prescription for tramadol 50 mg 6. Return to clinic in 4 weeks   Felecia ShellingBrent M. Evans, DPM [facility]  Dr. Felecia ShellingBrent M. Evans, DPM    [address]                                        [place], [place] [id number]                Office [phone]  Fax [phone]

## 2017-02-12 ENCOUNTER — Other Ambulatory Visit: Payer: Self-pay | Admitting: Family Medicine

## 2017-02-20 DIAGNOSIS — G4733 Obstructive sleep apnea (adult) (pediatric): Secondary | ICD-10-CM | POA: Diagnosis not present

## 2017-03-01 ENCOUNTER — Ambulatory Visit (INDEPENDENT_AMBULATORY_CARE_PROVIDER_SITE_OTHER): Payer: BLUE CROSS/BLUE SHIELD | Admitting: Podiatry

## 2017-03-01 ENCOUNTER — Encounter: Payer: Self-pay | Admitting: Podiatry

## 2017-03-01 DIAGNOSIS — M7662 Achilles tendinitis, left leg: Secondary | ICD-10-CM

## 2017-03-01 DIAGNOSIS — M7661 Achilles tendinitis, right leg: Secondary | ICD-10-CM | POA: Diagnosis not present

## 2017-03-01 MED ORDER — OXYCODONE-ACETAMINOPHEN 10-325 MG PO TABS: 1.0000 | ORAL_TABLET | ORAL | 0 refills | Status: DC | PRN

## 2017-03-01 NOTE — Progress Notes (Signed)
He presents today complaining of severe pain to the posterior aspect of the bilateral heel. He denies any changes in his past medical history medications allergy surgery social history and review of systems. He states that the tendinitis in the left dorsal foot  Objective: Pulses are full and palpable bilateral neurologic sensorium is intact deep tendon reflexes are intact muscle strength is normal bilaterally. Orthopedic evaluation shows all joints distal to the ankle and full range of motion without crepitation. He has pain on palpation to the posterior aspect of the bilateral heels with heel spurs present. I also reviewed an x-ray was taken last time of his left foot and does demonstrate intratendinous calcification.  Assessment: Achilles tendinitis with retrocalcaneal heel spur bilateral. This is chronic in nature with intratendinous calcification.  Plan: I carefully injected 2 mg of dexamethasone and local anesthetic subcutaneously but not into the tendon itself posteriorly. I also wrote him a prescription for 30 Percocet 1 every 4-6 hours and [name] on an as-needed basis.

## 2017-03-22 ENCOUNTER — Encounter: Payer: Self-pay | Admitting: Podiatry

## 2017-03-22 ENCOUNTER — Ambulatory Visit (INDEPENDENT_AMBULATORY_CARE_PROVIDER_SITE_OTHER): Payer: BLUE CROSS/BLUE SHIELD | Admitting: Podiatry

## 2017-03-22 ENCOUNTER — Ambulatory Visit: Payer: BLUE CROSS/BLUE SHIELD | Admitting: Skilled Nursing Facility1

## 2017-03-22 DIAGNOSIS — M7662 Achilles tendinitis, left leg: Secondary | ICD-10-CM

## 2017-03-22 DIAGNOSIS — M7661 Achilles tendinitis, right leg: Secondary | ICD-10-CM | POA: Diagnosis not present

## 2017-03-22 MED ORDER — TRAMADOL HCL 50 MG PO TABS: 50.0000 mg | ORAL_TABLET | Freq: Four times a day (QID) | ORAL | 0 refills | Status: DC | PRN

## 2017-03-23 NOTE — Progress Notes (Signed)
He presents today stating these still having some pain to the posterior aspect of his right heel. He states he continues to use his pain pills when necessary but would like to consider decreasing the potency. He is also here today to be casted for orthotics.  Objective: Pulses are palpable. Neurologic sensorium is intact pain. He still has pain on palpation of the distal most aspect of the Achilles right over the left. Majority of his pain appears to be posterior and lateral of the calcaneus. Usually this is bilaterally symmetrical but today he's only pain on the right.  Assessment: Insertional Achilles tendinitis right.  Plan: At this point I injected the area with 2 mg of dexamethasone and local anesthetic making sure not to inject into the tendon itself. This alleviated his symptoms immediately. I also wrote her a prescription for tramadol. He was also casted for orthotics. He will need a slight heel raise.

## 2017-03-26 ENCOUNTER — Ambulatory Visit: Payer: BLUE CROSS/BLUE SHIELD | Admitting: Skilled Nursing Facility1

## 2017-04-02 ENCOUNTER — Encounter: Payer: BLUE CROSS/BLUE SHIELD | Attending: General Surgery | Admitting: Skilled Nursing Facility1

## 2017-04-02 ENCOUNTER — Encounter: Payer: Self-pay | Admitting: Skilled Nursing Facility1

## 2017-04-02 DIAGNOSIS — Z713 Dietary counseling and surveillance: Secondary | ICD-10-CM | POA: Insufficient documentation

## 2017-04-02 DIAGNOSIS — E669 Obesity, unspecified: Secondary | ICD-10-CM | POA: Insufficient documentation

## 2017-04-02 DIAGNOSIS — Z6841 Body Mass Index (BMI) 40.0 and over, adult: Secondary | ICD-10-CM

## 2017-04-02 NOTE — Patient Instructions (Addendum)
-  Just take bariatric advantage and calcium

## 2017-04-02 NOTE — Progress Notes (Signed)
Post-Operative RYGB Surgery  Medical Nutrition Therapy:  Appt start time: 1140 end time: 1200  Primary concerns today: Post-operative Bariatric Surgery Nutrition Management.   Pt states he needs to be 280lbs for bone spur surgery. Pt states he uses food to deal with stress.  Pt states he has been average 8000 steps. Pt states he has stopped the night time snacking. Pt states he is going to [place] in February/March.    TANITA  BODY COMP RESULTS  11/16/2016 04/02/2017   BMI (kg/m^2) 50.5 50.3   Fat Mass (lbs) 152 172.2   Fat Free Mass (lbs) 190.2 168.2   Total Body Water (lbs) 151     Surgery date: 05/26/2014 Surgery type: RYGB Start weight at [facility]: 412 on 02/28/14 (highest weight 470 lbs per patient) Weight today: 340.4 lbs  Weight loss goal: 280 lbs to have bone spur surgery in his feet  Preferred Learning Style:   No preference indicated   Learning Readiness:   Ready  24-hr recall: B (AM): 1 to 2 scrambled eggs, 1 pieces [place] bacon or corned beef hash, 1 piece toast Snk (AM):protein shake (15g) L (PM):hamburger or chicken sandwich Snk (PM): P3 pack or Atkins protein shake (13-15g)---chips or nuts D (PM): [place], stuffing, green beans or baked beans or pinto beans (21-28g) Snk (PM): nuts  Fluid intake: 64 oz per day mostly water with sugar free flavoring, protein shake, Powerade zero, decaf coffee with half and half and Splenda, unsweet tea, sprite zero: 4 16 ounce water  Estimated total protein intake: 90-105 g  Medications: see list Supplementation: bariatric advantage, zinc, vitamin d, vitamin B12, calcium   Using straws: No Drinking while eating: trying not to Hair loss: yes  Carbonated beverages: No N/V/D/C: none Dumping syndrome: yes after drinking too fast and too soon after eating Having you been chewing well: yes Chewing/swallowing difficulties: no Changes in vision: no Changes to mood/headaches: no Hair loss/Cahnges to skin/Changes to nails: no Any  difficulty focusing or concentrating: no Sweating: no Dizziness/Lightheaded: no Palpitations: no  Carbonated beverages: yes  Recent physical activity: 3 days a week for 45 minutes  Progress Towards Goal(s):  In progress.  Handout provided: pre op diet   Nutritional Diagnosis:  -3.3 Overweight/obesity related to past poor dietary habits and physical inactivity as evidenced by patient w/ recent RYGB surgery following dietary guidelines for continued weight loss.    Intervention:  Nutrition education/diet reinforcement. Goals: -Just take bariatric advantage and calcium -Focus on balanced meals   Teaching Method Utilized:  Visual Auditory Hands on  Barriers to learning/adherence to lifestyle change: none  Demonstrated degree of understanding via:  Teach Back   Monitoring/Evaluation:  Dietary intake, exercise, and body weight. Follow up in 6 months.

## 2017-04-10 DIAGNOSIS — M25462 Effusion, left knee: Secondary | ICD-10-CM | POA: Diagnosis not present

## 2017-04-10 DIAGNOSIS — M25562 Pain in left knee: Secondary | ICD-10-CM | POA: Diagnosis not present

## 2017-04-13 ENCOUNTER — Ambulatory Visit (INDEPENDENT_AMBULATORY_CARE_PROVIDER_SITE_OTHER): Payer: BLUE CROSS/BLUE SHIELD | Admitting: Medical

## 2017-04-13 ENCOUNTER — Encounter: Payer: Self-pay | Admitting: Medical

## 2017-04-13 VITALS — BP 134/90 | HR 86 | Temp 97.7°F | Ht 67.0 in | Wt 340.0 lb

## 2017-04-13 DIAGNOSIS — F411 Generalized anxiety disorder: Secondary | ICD-10-CM

## 2017-04-13 DIAGNOSIS — G4730 Sleep apnea, unspecified: Secondary | ICD-10-CM | POA: Diagnosis not present

## 2017-04-13 DIAGNOSIS — J029 Acute pharyngitis, unspecified: Secondary | ICD-10-CM

## 2017-04-13 DIAGNOSIS — M1000 Idiopathic gout, unspecified site: Secondary | ICD-10-CM | POA: Diagnosis not present

## 2017-04-13 DIAGNOSIS — G629 Polyneuropathy, unspecified: Secondary | ICD-10-CM | POA: Insufficient documentation

## 2017-04-13 DIAGNOSIS — I10 Essential (primary) hypertension: Secondary | ICD-10-CM

## 2017-04-13 DIAGNOSIS — Z9884 Bariatric surgery status: Secondary | ICD-10-CM

## 2017-04-13 DIAGNOSIS — Z6841 Body Mass Index (BMI) 40.0 and over, adult: Secondary | ICD-10-CM

## 2017-04-13 DIAGNOSIS — J069 Acute upper respiratory infection, unspecified: Secondary | ICD-10-CM

## 2017-04-13 LAB — POCT RAPID STREP A (OFFICE): RAPID STREP A SCREEN: NEGATIVE

## 2017-04-13 NOTE — Addendum Note (Signed)
Addended by: Winn JockVALENTINE, Odie Edmonds N on: 04/13/2017 12:02 PM   Modules accepted: Orders

## 2017-04-13 NOTE — Progress Notes (Signed)
Subjective: Chief Complaint  Patient presents with  . New Patient (Initial Visit)    new pt, get est,left knee pain     Here as a new patient to establish care.  Was seen by [facility], Dr. Deirdre Priest.  Didn't like inconsistently of seeing different providers and residents each time.  Medical team: Dr. Luiz Blare, Gus Puma, PA - [facility] Dr. Einar Grad, [facility] Dr. Melburn Popper, cardiology in past  Is a pastor, [employer].   Graduated from [employer] for [employer]inity  Had cardiac catheterization 2 years ago after chest pain.   Saw Dr. Melburn Popper.  Has had sore throat for about 2 days.  Wife has had sore as well.  No fever.  Has had some cough.  No ear pain.  Had some chills last night, malaise.   Feels a little better this morning.  No sinus pressure.   Took some tylenol sinus.     He has special needs son that has been sick this week with congestion.    Saw PA at [facility] few days ago, had knee aspiration and since then has had some soreness in left knee.  Had left knee scoped a year ago, arthroscopy.  Had gastric bypass 3 years ago, [facility], Dr. Andrey Campanile, [facility] surgery, sees them yearly for f/u.   Was close to 500 lb before surgery.   Had gall bladder taken out a year ago.    OSA - uses CPAP matching.  He reports compliance with medications.  Has hx/o anxiety and depression.   Is on Buspar for this.     Walks , 3 days per week.  Walks with another guy at church to keep accountability.  Tries to do 10,000 steps daily.  He has seen somewhat of a slow down with weight in past year.  Sees nutritionist at [facility] q30mo.  Checks BPs at home, gets 130s/70 at home.  Is nonfasting. Had sausage, scrambled egg, and biscuit today.  Jen 44yo, Noah 44yo seizures, tube fed, confined to wheelchair.  Trying to raise money for new wheelchair van.    Past Medical History:  Diagnosis Date  . Arthritis   . Bipolar disorder (HCC)     pt  stated not bipolar  . Hypertension   . Kidney stones age 7 to 23  . Nephrolithiasis   . Neuropathy   . Obese   . OSA on CPAP    uses cpap, pt does not know settings   Current Outpatient Prescriptions on File Prior to Visit  Medication Sig Dispense Refill  . allopurinol (ZYLOPRIM) 300 MG tablet Take 1 tablet (300 mg total) by mouth every morning. 90 tablet 1  . busPIRone (BUSPAR) 15 MG tablet Take 1 tablet (15 mg total) by mouth 2 (two) times daily. 60 tablet 5  . Calcium Citrate-Vitamin D 500-500 MG-UNIT CHEW Chew by mouth 3 (three) times daily.    . carvedilol (COREG) 3.125 MG tablet Take 1 tablet (3.125 mg total) by mouth 2 (two) times daily with a meal. 180 tablet 2  . Cholecalciferol (VITAMIN D PO) Take 2,000 Units by mouth every morning.     . gabapentin (NEURONTIN) 300 MG capsule Take 1 capsule (300 mg total) by mouth 3 (three) times daily. 1 by mouth at night as needed may increase to 3 at night as needed    . lisinopril (PRINIVIL,ZESTRIL) 20 MG tablet Take 1 tablet (20 mg total) by mouth daily. 90 tablet 1  .  loratadine (CLARITIN) 10 MG tablet Take 10 mg by mouth daily.    . Multiple Vitamin (MULTIVITAMIN WITH MINERALS) TABS Take 2 tablets by mouth every morning.     . traMADol (ULTRAM) 50 MG tablet Take 1 tablet (50 mg total) by mouth every 8 (eight) hours as needed. 30 tablet 0  . Zinc 50 MG TABS Take 1 tablet by mouth every morning.      Current Facility-Administered Medications on File Prior to Visit  Medication Dose Route Frequency Provider Last Rate Last Dose  . betamethasone acetate-betamethasone sodium phosphate (CELESTONE) injection 3 mg  3 mg Intramuscular Once Evans, Kipp BroodBrent M, DPM       ROS as in subjective    Objective BP 134/90   Pulse 86   Temp 97.7 F (36.5 C)   Ht 5\' 7"  (1.702 m)   Wt (!) 340 lb (154.2 kg)   SpO2 97%   BMI 53.25 kg/m   Wt Readings from Last 3 Encounters:  04/13/17 (!) 340 lb (154.2 kg)  04/02/17 (!) 340 lb 6.4 oz (154.4 kg)   11/16/16 (!) 342 lb 3.2 oz (155.2 kg)   BP Readings from Last 3 Encounters:  04/13/17 134/90  09/27/16 130/76  04/26/16 140/88   General appearance: alert, no distress, WD/WN,  HEENT: normocephalic, sclerae anicteric, TMs pearly, nares patent, no discharge or erythema, pharynx with mild erythema Oral cavity: MMM, no lesions Neck: supple, no lymphadenopathy, no thyromegaly, no masses Heart: RRR, normal S1, S2, no murmurs Lungs: CTA bilaterally, no wheezes, rhonchi, or rales Ext: no edema Pulses: 2+ symmetric, upper and lower extremities, normal cap refill     Assessment: Encounter Diagnoses  Name Primary?  Marland Kitchen. HYPERTENSION, BENIGN Yes  . Sleep apnea, unspecified type   . Morbid obesity with BMI of 50.0-59.9, adult (HCC)   . Idiopathic gout, unspecified chronicity, unspecified site   . Neuropathy   . Sore throat   . Upper respiratory tract infection, unspecified type   . History of gastric bypass   . Anxiety state     Plan: Strep negative. Discussed diagnosis of viral URI, discussed supported care, rest, hydration, and gave some OTC recommendations  Reviewed chart history, medications, last labs from 2017, and last visit notes in chart  He doesn't need refills currently  Advised he recheck in a few months fasting for updated labs and complete physical.  Encouraged him to c/t efforts lose weight, work on calories restriction or consider [employer].  C/t exercise.     C/t current medications.     Declines flu shot due to prior "illness" with getting flu shot.

## 2017-04-13 NOTE — Patient Instructions (Signed)
It was a pleasure to meet you today.   Recommendations You can use over the counter Mucinex DM or Robitussin DM for cough, congestion.  hydrate well, rest.  You can use warm fluids, salt water gargles and lozenges for sore throat pain.   Plan on coming back for fasting labs and a physical in the next few months to update your labs  Continue efforts to restrict calories to 1800 /day, and continue walking for exercise.   continue your medications as usual

## 2017-04-23 ENCOUNTER — Encounter: Payer: Self-pay | Admitting: Medical

## 2017-04-23 ENCOUNTER — Encounter: Payer: Self-pay | Admitting: Podiatry

## 2017-04-23 MED ORDER — CARVEDILOL 3.125 MG PO TABS: 3.1250 mg | ORAL_TABLET | Freq: Two times a day (BID) | ORAL | 0 refills | Status: DC

## 2017-04-26 ENCOUNTER — Ambulatory Visit: Payer: BLUE CROSS/BLUE SHIELD | Admitting: Orthotics

## 2017-04-26 DIAGNOSIS — M7662 Achilles tendinitis, left leg: Principal | ICD-10-CM

## 2017-04-26 DIAGNOSIS — M722 Plantar fascial fibromatosis: Secondary | ICD-10-CM

## 2017-04-26 DIAGNOSIS — M7752 Other enthesopathy of left foot: Secondary | ICD-10-CM

## 2017-04-26 DIAGNOSIS — M25562 Pain in left knee: Secondary | ICD-10-CM | POA: Diagnosis not present

## 2017-04-26 DIAGNOSIS — M7661 Achilles tendinitis, right leg: Secondary | ICD-10-CM

## 2017-04-26 DIAGNOSIS — M1712 Unilateral primary osteoarthritis, left knee: Secondary | ICD-10-CM | POA: Diagnosis not present

## 2017-04-26 NOTE — Progress Notes (Signed)
Patient came in today to pick up custom made foot orthotics.  The goals were accomplished and the patient reported no dissatisfaction with said orthotics.  Patient was advised of breakin period and how to report any issues. 

## 2017-05-03 ENCOUNTER — Ambulatory Visit: Payer: BLUE CROSS/BLUE SHIELD | Admitting: Medical

## 2017-05-03 ENCOUNTER — Encounter: Payer: Self-pay | Admitting: Medical

## 2017-05-03 VITALS — BP 128/80 | HR 70 | Temp 98.1°F | Ht 67.0 in | Wt 338.4 lb

## 2017-05-03 DIAGNOSIS — Z0000 Encounter for general adult medical examination without abnormal findings: Secondary | ICD-10-CM | POA: Diagnosis not present

## 2017-05-03 DIAGNOSIS — Z6841 Body Mass Index (BMI) 40.0 and over, adult: Secondary | ICD-10-CM

## 2017-05-03 DIAGNOSIS — G629 Polyneuropathy, unspecified: Secondary | ICD-10-CM

## 2017-05-03 DIAGNOSIS — I10 Essential (primary) hypertension: Secondary | ICD-10-CM | POA: Diagnosis not present

## 2017-05-03 DIAGNOSIS — Z87442 Personal history of urinary calculi: Secondary | ICD-10-CM | POA: Insufficient documentation

## 2017-05-03 DIAGNOSIS — F411 Generalized anxiety disorder: Secondary | ICD-10-CM

## 2017-05-03 DIAGNOSIS — G4730 Sleep apnea, unspecified: Secondary | ICD-10-CM

## 2017-05-03 DIAGNOSIS — M1000 Idiopathic gout, unspecified site: Secondary | ICD-10-CM | POA: Diagnosis not present

## 2017-05-03 DIAGNOSIS — Z9884 Bariatric surgery status: Secondary | ICD-10-CM

## 2017-05-03 DIAGNOSIS — J3089 Other allergic rhinitis: Secondary | ICD-10-CM | POA: Diagnosis not present

## 2017-05-03 LAB — POCT URINALYSIS DIP (PROADVANTAGE DEVICE)
BILIRUBIN UA: NEGATIVE
GLUCOSE UA: NEGATIVE mg/dL
Ketones, POC UA: NEGATIVE mg/dL
Leukocytes, UA: NEGATIVE
NITRITE UA: NEGATIVE
PH UA: 6.5 (ref 5.0–8.0)
Protein Ur, POC: NEGATIVE mg/dL
RBC UA: NEGATIVE
SPECIFIC GRAVITY, URINE: 1.02
UUROB: NEGATIVE

## 2017-05-03 MED ORDER — GABAPENTIN 300 MG PO CAPS: 300.0000 mg | ORAL_CAPSULE | Freq: Three times a day (TID) | ORAL | 3 refills | Status: DC

## 2017-05-03 MED ORDER — PRAVASTATIN SODIUM 20 MG PO TABS: 20.0000 mg | ORAL_TABLET | Freq: Every evening | ORAL | 0 refills | Status: DC

## 2017-05-03 MED ORDER — BUSPIRONE HCL 15 MG PO TABS: 15.0000 mg | ORAL_TABLET | Freq: Two times a day (BID) | ORAL | 1 refills | Status: DC

## 2017-05-03 MED ORDER — ALLOPURINOL 300 MG PO TABS: 300.0000 mg | ORAL_TABLET | Freq: Every morning | ORAL | 1 refills | Status: DC

## 2017-05-03 MED ORDER — AZITHROMYCIN 250 MG PO TABS: ORAL_TABLET | ORAL | 0 refills | Status: DC

## 2017-05-03 MED ORDER — PROMETHAZINE-DM 6.25-15 MG/5ML PO SYRP: 5.0000 mL | ORAL_SOLUTION | Freq: Four times a day (QID) | ORAL | 0 refills | Status: DC | PRN

## 2017-05-03 MED ORDER — LISINOPRIL 20 MG PO TABS: 20.0000 mg | ORAL_TABLET | Freq: Every day | ORAL | 1 refills | Status: DC

## 2017-05-03 NOTE — Progress Notes (Signed)
Subjective:   HPI  Justin [name] is a 46 y.o. male who presents for physical Chief Complaint  Patient presents with  . Annual Exam    physical , congestion in head     Medical care team includes: Luddie Boghosian, Kermit Balo, PA-C here for primary care Dr. Luiz Blare, Gus Puma, PA - [facility] Dr. Arbutus Ped, [facility] Dr. Melburn Popper, cardiology in past  He notes still having cough.  I saw him recent as new patient, but is worse.   He reports cough, congestion, ears with some pressure and pain, sore throat, productive cough.   Taking some mucinex.   Gout and hx/o kidney stones  - takes allopurinol daily.   Has had stones throughout.    Reviewed their medical, surgical, family, social, medication, and allergy history and updated chart as appropriate.  Past Medical History:  Diagnosis Date  . Anxiety   . Arthritis   . Gout   . Hypertension   . Kidney stones age 51 to 79   throughout life several times, on allopurinol prophylaxis  . Neuropathy    mainly left leg , but bilat legs s/p rapid weight loss and gastric bypass surgery  . Obese   . OSA on CPAP    uses cpap    Past Surgical History:  Procedure Laterality Date  . CHOLECYSTECTOMY N/A 03/13/2016   Procedure: LAPAROSCOPIC CHOLECYSTECTOMY WITH INTRAOPERATIVE CHOLANGIOGRAM;  Surgeon: Gaynelle Adu, MD;  Location: [facility];  Service: General;  Laterality: N/A;  . GASTRIC ROUX-EN-Y N/A 05/26/2014   Procedure: LAPAROSCOPIC ROUX-EN-Y GASTRIC BYPASS WITH UPPER ENDOSCOPY;  Surgeon: Atilano Ina, MD;  Location: [facility];  Service: General;  Laterality: N/A;  . KNEE ARTHROSCOPY Left 10/04/2015   Procedure: LEFT KNEE ARTHROSCOPY WITH LATERAL RETINACULAR RELEASE MEDIAL PLICA INCISION;  Surgeon: Jodi Geralds, MD;  Location: [facility];  Service: Orthopedics;  Laterality: Left;  . LEFT HEART CATHETERIZATION WITH CORONARY ANGIOGRAM N/A 09/17/2013   Procedure: LEFT HEART CATHETERIZATION WITH CORONARY ANGIOGRAM;  Surgeon: Peter M Swaziland, MD;  Location: [facility]  CATH LAB;  Service: Cardiovascular;  Laterality: N/A;  . llithotripsy     . TOOTH EXTRACTION    . UPPER GI ENDOSCOPY  05/26/2014   Procedure: UPPER GI ENDOSCOPY;  Surgeon: Atilano Ina, MD;  Location: [facility];  Service: General;;    Social History   Socioeconomic History  . Marital status: Married    Spouse name: Not on file  . Number of children: Not on file  . Years of education: Not on file  . Highest education level: Not on file  Social Needs  . Financial resource strain: Not on file  . Food insecurity - worry: Not on file  . Food insecurity - inability: Not on file  . Transportation needs - medical: Not on file  . Transportation needs - non-medical: Not on file  Occupational History  . Not on file  Tobacco Use  . Smoking status: Never Smoker  . Smokeless tobacco: Never Used  Substance and Sexual Activity  . Alcohol use: No  . Drug use: No  . Sexual activity: Yes    Partners: Female  Other Topics Concern  . Not on file  Social History Narrative   Marital Status: Married      Children: 2, son with special needs (seizures, developmental delay, tube fed, confined to motorized chair) (age 58yo) and daughter age (13yo)      Occupation: Education officer, environmental / Runner, broadcasting/film/video on internet  For fun - computer and gaming   04/2017    Family History  Problem Relation Age of Onset  . Coronary artery disease Mother 84       MI  . Diabetes Mother   . Stroke Mother        ~2012-Deceased  . Aneurysm Mother   . Arthritis Mother        DDD  . Heart disease Mother   . Hypertension Father   . Alcohol abuse Father   . Gout Father   . Depression Father   . Dystonia Maternal Grandmother   . Hypertension Maternal Grandmother   . Heart disease Maternal Grandmother   . Heart disease Maternal Grandfather        died of MI  . COPD Paternal Grandmother   . Alzheimer's disease Paternal Grandmother   . Heart disease Paternal Grandmother        pacemaker  . Heart disease Paternal Grandfather   .  Cancer Paternal Grandfather        prostate and colon  . Gallbladder disease Paternal Grandfather      Current Outpatient Medications:  .  allopurinol (ZYLOPRIM) 300 MG tablet, Take 1 tablet (300 mg total) every morning by mouth., Disp: 90 tablet, Rfl: 1 .  busPIRone (BUSPAR) 15 MG tablet, Take 1 tablet (15 mg total) 2 (two) times daily by mouth., Disp: 180 tablet, Rfl: 1 .  Calcium Citrate-Vitamin D 500-500 MG-UNIT CHEW, Chew by mouth 3 (three) times daily., Disp: , Rfl:  .  carvedilol (COREG) 3.125 MG tablet, Take 1 tablet (3.125 mg total) 2 (two) times daily with a meal by mouth., Disp: 180 tablet, Rfl: 0 .  Cholecalciferol (VITAMIN D PO), Take 2,000 Units by mouth every morning. , Disp: , Rfl:  .  gabapentin (NEURONTIN) 300 MG capsule, Take 1 capsule (300 mg total) 3 (three) times daily by mouth. 1 by mouth at night as needed may increase to 3 at night as needed, Disp: 270 capsule, Rfl: 3 .  lisinopril (PRINIVIL,ZESTRIL) 20 MG tablet, Take 1 tablet (20 mg total) daily by mouth., Disp: 90 tablet, Rfl: 1 .  loratadine (CLARITIN) 10 MG tablet, Take 10 mg by mouth daily., Disp: , Rfl:  .  Multiple Vitamin (MULTIVITAMIN WITH MINERALS) TABS, Take 2 tablets by mouth every morning. , Disp: , Rfl:  .  traMADol (ULTRAM) 50 MG tablet, Take 1 tablet (50 mg total) by mouth every 8 (eight) hours as needed., Disp: 30 tablet, Rfl: 0 .  Zinc 50 MG TABS, Take 1 tablet by mouth every morning. , Disp: , Rfl:  .  azithromycin (ZITHROMAX) 250 MG tablet, 2 tablets day 1, then 1 tablet days 2-4, Disp: 6 tablet, Rfl: 0 .  pravastatin (PRAVACHOL) 20 MG tablet, Take 1 tablet (20 mg total) every evening by mouth., Disp: 90 tablet, Rfl: 0 .  promethazine-dextromethorphan (PROMETHAZINE-DM) 6.25-15 MG/5ML syrup, Take 5 mLs 4 (four) times daily as needed by mouth for cough., Disp: 120 mL, Rfl: 0  Current Facility-Administered Medications:  .  betamethasone acetate-betamethasone sodium phosphate (CELESTONE) injection 3  mg, 3 mg, Intramuscular, Once, Felecia Shelling, DPM  Allergies  Allergen Reactions  . Lamictal [Lamotrigine] Other (See Comments)    INCREASED LFTs  . Tegretol [Carbamazepine] Other (See Comments)    INCREASED LFTS  . Sulfonamide Derivatives Hives  . Influenza Vaccines      Review of Systems Constitutional: -fever, -chills, -sweats, -unexpected weight change, -decreased appetite, -fatigue Allergy: -sneezing, -itching, -congestion Dermatology: -changing  moles, --rash, -lumps ENT: -runny nose, -ear pain, -sore throat, -hoarseness, -sinus pain, -teeth pain, - ringing in ears, -hearing loss, -nosebleeds Cardiology: -chest pain, -palpitations, -swelling, -difficulty breathing when lying flat, -waking up short of breath Respiratory: +cough, -shortness of breath, -difficulty breathing with exercise or exertion, -wheezing, -coughing up blood Gastroenterology: -abdominal pain, -nausea, -vomiting, -diarrhea, -constipation, -blood in stool, -changes in bowel movement, -difficulty swallowing or eating Hematology: -bleeding, -bruising  Musculoskeletal: -joint aches, -muscle aches, -joint swelling, -back pain, -neck pain, -cramping, -changes in gait Ophthalmology: denies vision changes, eye redness, itching, discharge Urology: -burning with urination, -difficulty urinating, -blood in urine, -urinary frequency, -urgency, -incontinence Neurology: -headache, -weakness, -tingling, -numbness, -memory loss, -falls, -dizziness Psychology: -depressed mood, -agitation, -sleep problems     Objective:   BP 128/80   Pulse 70   Temp 98.1 F (36.7 C)   Ht 5\' 7"  (1.702 m)   Wt (!) 338 lb 6.4 oz (153.5 kg)   SpO2 98%   BMI 53.00 kg/m   Wt Readings from Last 3 Encounters:  05/03/17 (!) 338 lb 6.4 oz (153.5 kg)  04/13/17 (!) 340 lb (154.2 kg)  04/02/17 (!) 340 lb 6.4 oz (154.4 kg)    General appearance: alert, no distress, WD/WN, Caucasian male. Morbidly obese Skin: scattered macules and cherry  hemangiomas HEENT: normocephalic, conjunctiva/corneas normal, sclerae anicteric, PERRLA, EOMi, nares patent, no discharge or erythema, pharynx normal Oral cavity: MMM, tongue normal, teeth with lots of plaque Neck: supple, no lymphadenopathy, no thyromegaly, no masses, normal ROM, no bruits Chest: non tender, normal shape and expansion Heart: RRR, normal S1, S2, no murmurs Lungs: CTA bilaterally, no wheezes, rhonchi, or rales Abdomen: few small port scars, large pannus with a smaller separate inferiour pannus, +bs, soft, non tender, non distended, no masses, no hepatomegaly, no splenomegaly, no bruits Back: non tender, normal ROM, no scoliosis Musculoskeletal:  upper extremities non tender, no obvious deformity, normal ROM throughout, lower extremities non tender, no obvious deformity, normal ROM throughout Extremities: no edema, no cyanosis, no clubbing Pulses: 2+ symmetric, upper and lower extremities, normal cap refill Neurological: alert, oriented x 3, CN2-12 intact, strength normal upper extremities and lower extremities, sensation normal throughout, DTRs 2+ throughout, no cerebellar signs, gait normal Psychiatric: normal affect, behavior normal, pleasant  GU: normal male external genitalia, nontender, no masses, no hernia, no lymphadenopathy Rectal: deferred   Assessment and Plan :    Encounter Diagnoses  Name Primary?  . Encounter for health maintenance examination in adult Yes  . HYPERTENSION, BENIGN   . Seasonal allergic rhinitis due to other allergic trigger   . Sleep apnea, unspecified type   . Neuropathy   . Anxiety state   . Idiopathic gout, unspecified chronicity, unspecified site   . History of gastric bypass   . Morbid obesity with BMI of 50.0-59.9, adult (HCC)   . History of kidney stones     Physical exam - discussed and counseled on healthy lifestyle, diet, exercise, preventative care, vaccinations, sick and well care, proper use of emergency dept and after hours  care, and addressed their concerns.    Health screening: See your eye doctor yearly for routine vision care. See your dentist yearly for routine dental care including hygiene visits twice yearly.  Cancer screening Discussed colonoscopy screening age 44yo unless higher risk for earlier screening Discussed PSA, prostate exam, and prostate cancer screening risks/benefits.   Discussed prostate symptoms as well.  Prostate screening performed: No Advised monthly self testicular exam  Vaccinations: Counseled on the  following vaccines:  Pneumococcal.  He will consider Up to date on Td and declines flu shot.  Acute issues discussed: Begin zpak and medication for cough, rest, hydrate well  Separate significant chronic issues discussed: Advised he begin statin given heart disease risks  Chrissie NoaWilliam was seen today for annual exam.  Diagnoses and all orders for this visit:  Encounter for health maintenance examination in adult -     POCT Urinalysis DIP (Proadvantage Device) -     Comprehensive metabolic panel -     CBC -     Hemoglobin A1c -     Lipid panel -     Testosterone -     Uric acid  HYPERTENSION, BENIGN -     Lipid panel  Seasonal allergic rhinitis due to other allergic trigger  Sleep apnea, unspecified type  Neuropathy  Anxiety state  Idiopathic gout, unspecified chronicity, unspecified site -     Uric acid  History of gastric bypass  Morbid obesity with BMI of 50.0-59.9, adult (HCC) -     Hemoglobin A1c -     Testosterone  History of kidney stones  Other orders -     azithromycin (ZITHROMAX) 250 MG tablet; 2 tablets day 1, then 1 tablet days 2-4 -     promethazine-dextromethorphan (PROMETHAZINE-DM) 6.25-15 MG/5ML syrup; Take 5 mLs 4 (four) times daily as needed by mouth for cough. -     lisinopril (PRINIVIL,ZESTRIL) 20 MG tablet; Take 1 tablet (20 mg total) daily by mouth. -     gabapentin (NEURONTIN) 300 MG capsule; Take 1 capsule (300 mg total) 3 (three)  times daily by mouth. 1 by mouth at night as needed may increase to 3 at night as needed -     busPIRone (BUSPAR) 15 MG tablet; Take 1 tablet (15 mg total) 2 (two) times daily by mouth. -     allopurinol (ZYLOPRIM) 300 MG tablet; Take 1 tablet (300 mg total) every morning by mouth. -     pravastatin (PRAVACHOL) 20 MG tablet; Take 1 tablet (20 mg total) every evening by mouth.    Follow-up pending labs, yearly for physical

## 2017-05-04 LAB — COMPREHENSIVE METABOLIC PANEL
AG Ratio: 1.6 (calc) (ref 1.0–2.5)
ALKALINE PHOSPHATASE (APISO): 76 U/L (ref 40–115)
ALT: 14 U/L (ref 9–46)
AST: 13 U/L (ref 10–40)
Albumin: 4 g/dL (ref 3.6–5.1)
BUN: 12 mg/dL (ref 7–25)
CALCIUM: 9.1 mg/dL (ref 8.6–10.3)
CHLORIDE: 103 mmol/L (ref 98–110)
CO2: 28 mmol/L (ref 20–32)
Creat: 0.88 mg/dL (ref 0.60–1.35)
Globulin: 2.5 g/dL (calc) (ref 1.9–3.7)
Glucose, Bld: 85 mg/dL (ref 65–99)
POTASSIUM: 4.6 mmol/L (ref 3.5–5.3)
SODIUM: 140 mmol/L (ref 135–146)
Total Bilirubin: 0.9 mg/dL (ref 0.2–1.2)
Total Protein: 6.5 g/dL (ref 6.1–8.1)

## 2017-05-04 LAB — LIPID PANEL
CHOL/HDL RATIO: 4.7 (calc) (ref <5.0)
CHOLESTEROL: 205 mg/dL — AB (ref <200)
HDL: 44 mg/dL (ref >40)
LDL Cholesterol (Calc): 127 mg/dL (calc) — ABNORMAL HIGH
NON-HDL CHOLESTEROL (CALC): 161 mg/dL — AB (ref <130)
Triglycerides: 197 mg/dL — ABNORMAL HIGH (ref <150)

## 2017-05-04 LAB — TESTOSTERONE: TESTOSTERONE: 248 ng/dL — AB (ref 250–827)

## 2017-05-04 LAB — CBC
HCT: 42.7 % (ref 38.5–50.0)
HEMOGLOBIN: 14.7 g/dL (ref 13.2–17.1)
MCH: 30.8 pg (ref 27.0–33.0)
MCHC: 34.4 g/dL (ref 32.0–36.0)
MCV: 89.3 fL (ref 80.0–100.0)
MPV: 11 fL (ref 7.5–12.5)
Platelets: 295 10*3/uL (ref 140–400)
RBC: 4.78 10*6/uL (ref 4.20–5.80)
RDW: 13.1 % (ref 11.0–15.0)
WBC: 7.8 10*3/uL (ref 3.8–10.8)

## 2017-05-04 LAB — HEMOGLOBIN A1C
EAG (MMOL/L): 6 (calc)
Hgb A1c MFr Bld: 5.4 % of total Hgb (ref <5.7)
MEAN PLASMA GLUCOSE: 108 (calc)

## 2017-05-04 LAB — URIC ACID: Uric Acid, Serum: 6.7 mg/dL (ref 4.0–8.0)

## 2017-05-14 ENCOUNTER — Ambulatory Visit: Payer: BLUE CROSS/BLUE SHIELD | Admitting: Medical

## 2017-05-14 ENCOUNTER — Encounter: Payer: Self-pay | Admitting: Medical

## 2017-05-14 VITALS — BP 144/88 | HR 78 | Temp 98.1°F | Wt 345.6 lb

## 2017-05-14 DIAGNOSIS — J329 Chronic sinusitis, unspecified: Secondary | ICD-10-CM | POA: Diagnosis not present

## 2017-05-14 DIAGNOSIS — J40 Bronchitis, not specified as acute or chronic: Secondary | ICD-10-CM

## 2017-05-14 MED ORDER — ALBUTEROL SULFATE HFA 108 (90 BASE) MCG/ACT IN AERS: 2.0000 | INHALATION_SPRAY | Freq: Four times a day (QID) | RESPIRATORY_TRACT | 0 refills | Status: DC | PRN

## 2017-05-14 MED ORDER — AMOXICILLIN 500 MG PO TABS
ORAL_TABLET | ORAL | 0 refills | Status: DC
Start: 2017-05-14 — End: 2017-08-03

## 2017-05-14 MED ORDER — HYDROCODONE-HOMATROPINE 5-1.5 MG/5ML PO SYRP: 5.0000 mL | ORAL_SOLUTION | Freq: Three times a day (TID) | ORAL | 0 refills | Status: DC | PRN

## 2017-05-14 NOTE — Progress Notes (Signed)
Subjective: Chief Complaint  Patient presents with  . coughing    coughing , it feels deeper in chest, headaches, runny nose    Here for cough.  I saw him at his recent physical for cough as well.   We did a round of zpak and promethazine DM.  He currently reports ongoing cough, productive cough, head and chest congestion, bad headache.   Has had low grade fever.  No nausea, no vomiting, no diarrhea.   Has had some some throat.  Ears hurt a little, glands seem swollen.  Has had some household contacts.   Hasn't had pneumonia in a long time.   Doesn't get sinus problems a lot.   No other aggravating or relieving factors. No other complaint.  Past Medical History:  Diagnosis Date  . Anxiety   . Arthritis   . Gout   . Hypertension   . Kidney stones age 46 to [age]   throughout life several times, on allopurinol prophylaxis  . Neuropathy    mainly left leg , but bilat legs s/p rapid weight loss and gastric bypass surgery  . Obese   . OSA on CPAP    uses cpap   Current Outpatient Medications on File Prior to Visit  Medication Sig Dispense Refill  . allopurinol (ZYLOPRIM) 300 MG tablet Take 1 tablet (300 mg total) every morning by mouth. 90 tablet 1  . busPIRone (BUSPAR) 15 MG tablet Take 1 tablet (15 mg total) 2 (two) times daily by mouth. 180 tablet 1  . Calcium Citrate-Vitamin D 500-500 MG-UNIT CHEW Chew by mouth 3 (three) times daily.    . carvedilol (COREG) 3.125 MG tablet Take 1 tablet (3.125 mg total) 2 (two) times daily with a meal by mouth. 180 tablet 0  . Cholecalciferol (VITAMIN D PO) Take 2,000 Units by mouth every morning.     . gabapentin (NEURONTIN) 300 MG capsule Take 1 capsule (300 mg total) 3 (three) times daily by mouth. 1 by mouth at night as needed may increase to 3 at night as needed 270 capsule 3  . lisinopril (PRINIVIL,ZESTRIL) 20 MG tablet Take 1 tablet (20 mg total) daily by mouth. 90 tablet 1  . loratadine (CLARITIN) 10 MG tablet Take 10 mg by mouth daily.    .  Multiple Vitamin (MULTIVITAMIN WITH MINERALS) TABS Take 2 tablets by mouth every morning.     . pravastatin (PRAVACHOL) 20 MG tablet Take 1 tablet (20 mg total) every evening by mouth. 90 tablet 0  . traMADol (ULTRAM) 50 MG tablet Take 1 tablet (50 mg total) by mouth every 8 (eight) hours as needed. 30 tablet 0  . Zinc 50 MG TABS Take 1 tablet by mouth every morning.      Current Facility-Administered Medications on File Prior to Visit  Medication Dose Route Frequency Provider Last Rate Last Dose  . betamethasone acetate-betamethasone sodium phosphate (CELESTONE) injection 3 mg  3 mg Intramuscular Once Evans, Kipp BroodBrent M, DPM       ROS as in subjective   Objective: BP (!) 144/88   Pulse 78   Temp 98.1 F (36.7 C)   Wt (!) 345 lb 9.6 oz (156.8 kg)   SpO2 96%   BMI 54.13 kg/m   General appearance: Alert, WD/WN, no distress                             Skin: warm, no rash, no diaphoresis  Head: no sinus tenderness                            Eyes: conjunctiva normal, corneas clear, PERRLA                            Ears: pearly TMs, external ear canals normal                          Nose: septum midline, turbinates swollen, with erythema and clear discharge             Mouth/throat: MMM, tongue normal, mild pharyngeal erythema                           Neck: supple, no adenopathy, no thyromegaly, non tender                          Heart: RRR, normal S1, S2, no murmurs                         Lungs: +bronchial breath sounds, +scattered rhonchi, no wheezes, no rales                Extremities: no edema, non tender        Assessment: Encounter Diagnosis  Name Primary?  . Sinobronchitis Yes    Plan: Discussed symptoms, exam findings.    Begin medications below, rest, hydrate well.  If not much improved by Friday in 5 days, call or return.      Chrissie NoaWilliam was seen today for coughing.  Diagnoses and all orders for this visit:  Sinobronchitis  Other  orders -     albuterol (PROVENTIL HFA;VENTOLIN HFA) 108 (90 Base) MCG/ACT inhaler; Inhale 2 puffs into the lungs every 6 (six) hours as needed for wheezing or shortness of breath. -     HYDROcodone-homatropine (HYCODAN) 5-1.5 MG/5ML syrup; Take 5 mLs by mouth every 8 (eight) hours as needed for cough. -     amoxicillin (AMOXIL) 500 MG tablet; 2 tablets po BID x 10 days

## 2017-05-17 DIAGNOSIS — M25562 Pain in left knee: Secondary | ICD-10-CM | POA: Diagnosis not present

## 2017-05-24 ENCOUNTER — Other Ambulatory Visit: Payer: Self-pay | Admitting: Orthopedic Surgery

## 2017-05-24 DIAGNOSIS — S83242A Other tear of medial meniscus, current injury, left knee, initial encounter: Secondary | ICD-10-CM

## 2017-05-31 ENCOUNTER — Ambulatory Visit
Admission: RE | Admit: 2017-05-31 | Discharge: 2017-05-31 | Disposition: A | Discharge status: Home / Self Care | Payer: BLUE CROSS/BLUE SHIELD | Source: Ambulatory Visit | Attending: Orthopedic Surgery | Admitting: Orthopedic Surgery

## 2017-05-31 DIAGNOSIS — S83242A Other tear of medial meniscus, current injury, left knee, initial encounter: Secondary | ICD-10-CM

## 2017-05-31 DIAGNOSIS — M25562 Pain in left knee: Secondary | ICD-10-CM | POA: Diagnosis not present

## 2017-06-05 DIAGNOSIS — M1712 Unilateral primary osteoarthritis, left knee: Secondary | ICD-10-CM | POA: Diagnosis not present

## 2017-06-05 DIAGNOSIS — M25552 Pain in left hip: Secondary | ICD-10-CM | POA: Diagnosis not present

## 2017-06-05 DIAGNOSIS — M25562 Pain in left knee: Secondary | ICD-10-CM | POA: Diagnosis not present

## 2017-06-21 ENCOUNTER — Encounter: Payer: Self-pay | Admitting: Podiatry

## 2017-06-21 ENCOUNTER — Ambulatory Visit: Payer: BLUE CROSS/BLUE SHIELD | Admitting: Podiatry

## 2017-06-21 DIAGNOSIS — M7661 Achilles tendinitis, right leg: Secondary | ICD-10-CM | POA: Diagnosis not present

## 2017-06-21 DIAGNOSIS — M7662 Achilles tendinitis, left leg: Secondary | ICD-10-CM | POA: Diagnosis not present

## 2017-06-21 MED ORDER — TRAMADOL HCL 50 MG PO TABS: 50.0000 mg | ORAL_TABLET | Freq: Three times a day (TID) | ORAL | 0 refills | Status: DC | PRN

## 2017-06-21 MED ORDER — METHYLPREDNISOLONE 4 MG PO TBPK: ORAL_TABLET | ORAL | 0 refills | Status: DC

## 2017-06-23 NOTE — Progress Notes (Signed)
He presents today chief complaint of pain to the bilateral heels.  Objective: Vital signs are stable he is alert and oriented x3 still has pain on palpation of the posterior aspect of his Achilles at its insertion on the heels.  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  Objective: Vital signs are stable he is alert and oriented x3.  Pulses are palpable.  His pain on palpation insertion site of the Achilles bilateral.  Assessment: Insertional Achilles tendinitis bilateral chronic in nature.  Plan: Injected 2 mg of dexamethasone and 2-1/2 mg of Marcaine to the point of maximal tenderness with 30-gauge needle after Betadine skin prep.  This was not injected into the tendon itself.  Also wrote a prescription for pain medication as well as prednisone.  Follow-up with him in 1 month or as needed.  Once his weight decreases to more than optimal level we will consider surgical intervention.

## 2017-07-10 DIAGNOSIS — M1712 Unilateral primary osteoarthritis, left knee: Secondary | ICD-10-CM | POA: Diagnosis not present

## 2017-07-17 ENCOUNTER — Other Ambulatory Visit: Payer: Self-pay | Admitting: Medical

## 2017-07-17 DIAGNOSIS — M1712 Unilateral primary osteoarthritis, left knee: Secondary | ICD-10-CM | POA: Diagnosis not present

## 2017-07-20 ENCOUNTER — Other Ambulatory Visit: Payer: Self-pay | Admitting: Medical

## 2017-07-26 DIAGNOSIS — M1711 Unilateral primary osteoarthritis, right knee: Secondary | ICD-10-CM | POA: Diagnosis not present

## 2017-07-26 DIAGNOSIS — M1712 Unilateral primary osteoarthritis, left knee: Secondary | ICD-10-CM | POA: Diagnosis not present

## 2017-08-03 ENCOUNTER — Encounter: Payer: Self-pay | Admitting: Medical

## 2017-08-03 ENCOUNTER — Ambulatory Visit: Payer: BLUE CROSS/BLUE SHIELD | Admitting: Medical

## 2017-08-03 ENCOUNTER — Ambulatory Visit (HOSPITAL_COMMUNITY)
Admission: RE | Admit: 2017-08-03 | Discharge: 2017-08-03 | Disposition: A | Discharge status: Home / Self Care | Payer: BLUE CROSS/BLUE SHIELD | Source: Ambulatory Visit | Attending: Medical | Admitting: Medical

## 2017-08-03 ENCOUNTER — Telehealth: Payer: Self-pay

## 2017-08-03 VITALS — BP 118/78 | HR 64 | Wt 342.2 lb

## 2017-08-03 DIAGNOSIS — M79652 Pain in left thigh: Secondary | ICD-10-CM | POA: Diagnosis not present

## 2017-08-03 DIAGNOSIS — R079 Chest pain, unspecified: Secondary | ICD-10-CM | POA: Diagnosis not present

## 2017-08-03 DIAGNOSIS — R1032 Left lower quadrant pain: Secondary | ICD-10-CM

## 2017-08-03 DIAGNOSIS — I10 Essential (primary) hypertension: Secondary | ICD-10-CM | POA: Diagnosis not present

## 2017-08-03 DIAGNOSIS — Z6841 Body Mass Index (BMI) 40.0 and over, adult: Secondary | ICD-10-CM

## 2017-08-03 DIAGNOSIS — G4730 Sleep apnea, unspecified: Secondary | ICD-10-CM

## 2017-08-03 DIAGNOSIS — R1084 Generalized abdominal pain: Secondary | ICD-10-CM

## 2017-08-03 MED ORDER — OMEPRAZOLE 40 MG PO CPDR: 40.0000 mg | DELAYED_RELEASE_CAPSULE | Freq: Every day | ORAL | 1 refills | Status: DC

## 2017-08-03 NOTE — Assessment & Plan Note (Signed)
He reports compliance with CPAP. 

## 2017-08-03 NOTE — Assessment & Plan Note (Signed)
Left leg ultrasound to rule out DVT, discussed other causes of pain which may be leg strain/groin muscle strain..  We discussed home stretches, alternating ice and heat, and leg lifts to help resolve what may be a groin strain

## 2017-08-03 NOTE — Assessment & Plan Note (Signed)
He is continuing his efforts to lose weight

## 2017-08-03 NOTE — Telephone Encounter (Signed)
[facility] called and said pt is negative for DVT in left leg. Thanks Colgate-PalmoliveKH

## 2017-08-03 NOTE — Assessment & Plan Note (Signed)
We discussed possible causes of his left inguinal tenderness and left thigh pain.  Differential includes DVT, muscle strain, hernia, other.  Given his body habitus is difficult to determine whether he has a hernia.  We discussed that we would need a CT scan to prove the hernia.  We will go ahead and do a left leg ultrasound to rule out DVT

## 2017-08-03 NOTE — Assessment & Plan Note (Signed)
We discussed possible causes of his chest pain, including GERD, musculoskeletal, cardiac.  His symptoms are somewhat nonspecific.  He has had some symptoms of GERD, has history of gastric bypass and gallbladder surgery.  EKG reviewed, 2015 echocardiogram reviewed.  He does have multiple cardiac risk factors and family history of heart disease, so we will refer back to cardiology for evaluation.  He has a history of cardiac cath possibly around 2013 that was reportedly normal.  For now I will have him begin a trial of omeprazole, avoid GERD triggers

## 2017-08-03 NOTE — Assessment & Plan Note (Signed)
Controlled on current medicine, continue current medicine

## 2017-08-03 NOTE — Progress Notes (Signed)
Preliminary results by tech - Left Lower Ext. Venous Duplex Completed. Negative for deep and superficial  thrombosis in the left leg. [facility]. Adelene Idlerysinger's office. Marilynne Halstedita Salvatrice Morandi, BS, RDMS, RVT

## 2017-08-03 NOTE — Progress Notes (Signed)
Subjective:    Patient ID: Justin [name], male    DOB: 04-Sep-1970, 47 y.o.   MRN: [id number]  Here for several concerns, groin pain, chest pain.  Here with wife and teenage daughter.  Occasionally gets SOB with the chest pain.  No sweats or nausea.    Last few weeks awakening with some chest pains.   They resolve within a few hours.  Has hx/o gastric bypass.  recently had some nausea and vomiting after eating. Is compliant with CPAP.  Has hx/o cardiac cath in 2013 reportedly normal.  Done here in [place].  2 weeks ago started getting pain in left groin and upper thigh.  Dull pain today, but times it hurts real bad, sometimes mild pain.   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  He does lift heavy items from time to time. Denies specific injury or trauma.  Having some left cramps at times.   Sees ortho for left knee issue.   Had some recent gel shots in left knee.   Used to be over 500lb.     Past Medical History:  Diagnosis Date  . Anxiety   . Arthritis   . Gout   . Hypertension   . Kidney stones age 60 to 29   throughout life several times, on allopurinol prophylaxis  . Neuropathy    mainly left leg , but bilat legs s/p rapid weight loss and gastric bypass surgery  . Obese   . OSA on CPAP    uses cpap   Current Outpatient Medications on File Prior to Visit  Medication Sig Dispense Refill  . allopurinol (ZYLOPRIM) 300 MG tablet Take 1 tablet (300 mg total) every morning by mouth. 90 tablet 1  . busPIRone (BUSPAR) 15 MG tablet Take 1 tablet (15 mg total) 2 (two) times daily by mouth. 180 tablet 1  . Calcium Citrate-Vitamin D 500-500 MG-UNIT CHEW Chew by mouth 3 (three) times daily.    . carvedilol (COREG) 3.125 MG tablet TAKE 1 TABLET BY MOUTH TWICE DAILY WITH A MEAL 180 tablet 0  . gabapentin (NEURONTIN) 300 MG capsule Take 1 capsule (300 mg total) 3 (three) times daily by mouth. 1 by mouth at night as needed may increase to 3 at night as needed 270 capsule 3  .  lisinopril (PRINIVIL,ZESTRIL) 20 MG tablet Take 1 tablet (20 mg total) daily by mouth. 90 tablet 1  . loratadine (CLARITIN) 10 MG tablet Take 10 mg by mouth daily.    . Multiple Vitamin (MULTIVITAMIN WITH MINERALS) TABS Take 2 tablets by mouth every morning.     . pravastatin (PRAVACHOL) 20 MG tablet TAKE 1 TABLET BY MOUTH EVERY EVENING 90 tablet 0  . traMADol (ULTRAM) 50 MG tablet Take 1 tablet (50 mg total) by mouth every 8 (eight) hours as needed. 30 tablet 0  . Zinc 50 MG TABS Take 1 tablet by mouth every morning.     Marland Kitchen albuterol (PROVENTIL HFA;VENTOLIN HFA) 108 (90 Base) MCG/ACT inhaler Inhale 2 puffs into the lungs every 6 (six) hours as needed for wheezing or shortness of breath. (Patient not taking: Reported on 08/03/2017) 1 Inhaler 0   Current Facility-Administered Medications on File Prior to Visit  Medication Dose Route Frequency Provider Last Rate Last Dose  . betamethasone acetate-betamethasone sodium phosphate (CELESTONE) injection 3 mg  3 mg Intramuscular Once Felecia Shelling, DPM       Past Surgical History:  Procedure Laterality Date  . CHOLECYSTECTOMY N/A 03/13/2016  Procedure: LAPAROSCOPIC CHOLECYSTECTOMY WITH INTRAOPERATIVE CHOLANGIOGRAM;  Surgeon: Gaynelle AduEric Wilson, MD;  Location: [facility];  Service: General;  Laterality: N/A;  . GASTRIC ROUX-EN-Y N/A 05/26/2014   Procedure: LAPAROSCOPIC ROUX-EN-Y GASTRIC BYPASS WITH UPPER ENDOSCOPY;  Surgeon: Atilano InaEric M Wilson, MD;  Location: [facility];  Service: General;  Laterality: N/A;  . KNEE ARTHROSCOPY Left 10/04/2015   Procedure: LEFT KNEE ARTHROSCOPY WITH LATERAL RETINACULAR RELEASE MEDIAL PLICA INCISION;  Surgeon: Jodi GeraldsJohn Graves, MD;  Location: [facility];  Service: Orthopedics;  Laterality: Left;  . LEFT HEART CATHETERIZATION WITH CORONARY ANGIOGRAM N/A 09/17/2013   Procedure: LEFT HEART CATHETERIZATION WITH CORONARY ANGIOGRAM;  Surgeon: Peter M SwazilandJordan, MD;  [facility];  Service: Cardiovascular;  Laterality: N/A;  . llithotripsy     . TOOTH  EXTRACTION    . UPPER GI ENDOSCOPY  05/26/2014   Procedure: UPPER GI ENDOSCOPY;  Surgeon: Atilano InaEric M Wilson, MD;  Location: [facility];  Service: General;;    Review of Systems As in subjective    BP 118/78   Pulse 64   Wt (!) 342 lb 3.2 oz (155.2 kg)   SpO2 96%   BMI 53.60 kg/m   Wt Readings from Last 3 Encounters:  08/03/17 (!) 342 lb 3.2 oz (155.2 kg)  05/14/17 (!) 345 lb 9.6 oz (156.8 kg)  05/03/17 (!) 338 lb 6.4 oz (153.5 kg)   BP Readings from Last 3 Encounters:  08/03/17 118/78  05/14/17 (!) 144/88  05/03/17 128/80     Objective:   Physical Exam  Constitutional: He appears well-developed and well-nourished.  Cardiovascular: Normal rate, normal heart sounds and intact distal pulses.  No murmur heard. Pulmonary/Chest: Effort normal and breath sounds normal. No respiratory distress. He has no wheezes. He has no rales. He exhibits no tenderness.  Abdominal: Soft. He exhibits no distension and no mass. There is tenderness. There is no rebound and no guarding.  Mild epigastric tenderness, obese abdomen   Musculoskeletal: Normal range of motion. He exhibits no edema.       Right hip: Normal.       Left hip: Normal.       Left upper leg: He exhibits tenderness. He exhibits no bony tenderness, no swelling, no edema and no deformity.       Legs: Neurological:  Legs neuro intact   Skin: Skin is warm and dry.    Adult ECG Report  Indication: chest pain  Rate: 64 bpm  Rhythm: normal sinus rhythm  QRS Axis: 25 degrees  PR Interval: 186 ms  QRS Duration: 86ms  QTc: 402ms  Conduction Disturbances: none  Other Abnormalities: none  Patient's cardiac risk factors are: hypertension, male gender and obesity (BMI >= 30 kg/m2).  EKG comparison: none  Narrative Interpretation: normal EKG  I reviewed his echocardiogram from 08/2013.  The procedure was suboptimal due to body habitus and patient mobility, but there was moderate concentric hypertrophy of the left ventricle, ejection  fraction 60-65%.    Assessment & Plan:   Encounter Diagnoses  Name Primary?  . Chest pain, unspecified type Yes  . Left inguinal pain   . HYPERTENSION, BENIGN   . Sleep apnea, unspecified type   . Morbid obesity with BMI of 50.0-59.9, adult (HCC)   . Left thigh pain      HYPERTENSION, BENIGN Controlled on current medicine, continue current medicine  Sleep apnea He reports compliance with CPAP  Left inguinal pain We discussed possible causes of his left inguinal tenderness and left thigh pain.  Differential  includes DVT, muscle strain, hernia, other.  Given his body habitus is difficult to determine whether he has a hernia.  We discussed that we would need a CT scan to prove the hernia.  We will go ahead and do a left leg ultrasound to rule out DVT  Left thigh pain Left leg ultrasound to rule out DVT, discussed other causes of pain which may be leg strain/groin muscle strain..  We discussed home stretches, alternating ice and heat, and leg lifts to help resolve what may be a groin strain  Morbid obesity with BMI of 50.0-59.9, adult He is continuing his efforts to lose weight  Chest pain We discussed possible causes of his chest pain, including GERD, musculoskeletal, cardiac.  His symptoms are somewhat nonspecific.  He has had some symptoms of GERD, has history of gastric bypass and gallbladder surgery.  EKG reviewed, 2015 echocardiogram reviewed.  He does have multiple cardiac risk factors and family history of heart disease, so we will refer back to cardiology for evaluation.  He has a history of cardiac cath possibly around 2013 that was reportedly normal.  For now I will have him begin a trial of omeprazole, avoid GERD triggers  Milen was seen today for pain in gorin area.  Diagnoses and all orders for this visit:  Chest pain, unspecified type -     EKG 12-Lead -     Ambulatory referral to Cardiology  Left inguinal pain -     US Venous Img Lower Unilateral Left;  Future -     VAS [place] LOWER EXTREMITY VENOUS (DVT); Future  HYPERTENSION, BENIGN  Sleep apnea, unspecified type  Morbid obesity with BMI of 50.0-59.9, adult (HCC)  Left thigh pain -     US Venous Img Lower Unilateral Left; Future -     VAS [place] LOWER EXTREMITY VENOUS (DVT); Future  Other orders -     omeprazole (PRILOSEC) 40 MG capsule; Take 1 capsule (40 mg total) by mouth daily.

## 2017-08-03 NOTE — Telephone Encounter (Signed)
pls let him know we received call report that he is negative for blood clot.     (I have not seen the final report).

## 2017-08-06 ENCOUNTER — Encounter: Payer: Self-pay | Admitting: Medical

## 2017-08-08 ENCOUNTER — Other Ambulatory Visit: Payer: Self-pay | Admitting: Medical

## 2017-08-08 ENCOUNTER — Telehealth: Payer: Self-pay | Admitting: Medical

## 2017-08-08 DIAGNOSIS — R1084 Generalized abdominal pain: Secondary | ICD-10-CM

## 2017-08-08 NOTE — Telephone Encounter (Signed)
Justin [name], since Justin [name] is still having groin and lower abdominal pain, lets set up for CT abdomen/pelvis.

## 2017-08-08 NOTE — Progress Notes (Signed)
Ct abdom  

## 2017-08-10 ENCOUNTER — Other Ambulatory Visit: Payer: Self-pay | Admitting: Medical

## 2017-08-10 ENCOUNTER — Encounter: Payer: Self-pay | Admitting: Medical

## 2017-08-10 ENCOUNTER — Telehealth: Payer: Self-pay | Admitting: *Deleted

## 2017-08-10 ENCOUNTER — Encounter: Payer: Self-pay | Admitting: Podiatry

## 2017-08-10 NOTE — Telephone Encounter (Signed)
Called [facility] , had to change the order spoke with patricia is going call the pt get him set up for an appt.

## 2017-08-10 NOTE — Addendum Note (Signed)
Addended by: Winn JockVALENTINE, Nicoya Friel N on: 08/10/2017 04:52 PM   Modules accepted: Orders

## 2017-08-10 NOTE — Telephone Encounter (Signed)
Pt states he has pain medication, but his heel spur is acting up and he would like a prednisone pack.

## 2017-08-12 NOTE — Telephone Encounter (Signed)
He can give him a Medrol Dosepak 6-day.

## 2017-08-13 MED ORDER — METHYLPREDNISOLONE 4 MG PO TBPK: ORAL_TABLET | ORAL | 0 refills | Status: DC

## 2017-08-13 NOTE — Telephone Encounter (Signed)
Sent outgoing [employer] message informing pt the [id number].

## 2017-08-16 DIAGNOSIS — E782 Mixed hyperlipidemia: Secondary | ICD-10-CM | POA: Diagnosis not present

## 2017-08-16 DIAGNOSIS — G4733 Obstructive sleep apnea (adult) (pediatric): Secondary | ICD-10-CM | POA: Diagnosis not present

## 2017-08-16 DIAGNOSIS — R0789 Other chest pain: Secondary | ICD-10-CM | POA: Diagnosis not present

## 2017-08-16 DIAGNOSIS — I10 Essential (primary) hypertension: Secondary | ICD-10-CM | POA: Diagnosis not present

## 2017-08-23 DIAGNOSIS — M25562 Pain in left knee: Secondary | ICD-10-CM | POA: Diagnosis not present

## 2017-08-24 ENCOUNTER — Other Ambulatory Visit: Payer: BLUE CROSS/BLUE SHIELD

## 2017-08-24 ENCOUNTER — Ambulatory Visit
Admission: RE | Admit: 2017-08-24 | Discharge: 2017-08-24 | Disposition: A | Discharge status: Home / Self Care | Payer: BLUE CROSS/BLUE SHIELD | Source: Ambulatory Visit | Attending: Medical | Admitting: Medical

## 2017-08-24 DIAGNOSIS — N200 Calculus of kidney: Secondary | ICD-10-CM | POA: Diagnosis not present

## 2017-08-24 DIAGNOSIS — R1032 Left lower quadrant pain: Secondary | ICD-10-CM

## 2017-08-24 DIAGNOSIS — R1084 Generalized abdominal pain: Secondary | ICD-10-CM

## 2017-08-26 ENCOUNTER — Encounter: Payer: Self-pay | Admitting: Medical

## 2017-08-28 ENCOUNTER — Encounter: Payer: Self-pay | Admitting: Medical

## 2017-08-31 ENCOUNTER — Other Ambulatory Visit: Payer: BLUE CROSS/BLUE SHIELD

## 2017-09-03 DIAGNOSIS — R0602 Shortness of breath: Secondary | ICD-10-CM | POA: Diagnosis not present

## 2017-09-03 DIAGNOSIS — R0789 Other chest pain: Secondary | ICD-10-CM | POA: Diagnosis not present

## 2017-09-03 DIAGNOSIS — E782 Mixed hyperlipidemia: Secondary | ICD-10-CM | POA: Diagnosis not present

## 2017-09-03 DIAGNOSIS — I10 Essential (primary) hypertension: Secondary | ICD-10-CM | POA: Diagnosis not present

## 2017-09-04 ENCOUNTER — Ambulatory Visit: Payer: BLUE CROSS/BLUE SHIELD | Admitting: Podiatry

## 2017-09-04 DIAGNOSIS — M7661 Achilles tendinitis, right leg: Secondary | ICD-10-CM | POA: Diagnosis not present

## 2017-09-04 DIAGNOSIS — M7662 Achilles tendinitis, left leg: Secondary | ICD-10-CM

## 2017-09-04 NOTE — Progress Notes (Signed)
He presents today for follow-up of his insertional Achilles tendinitis.  Objective: Vital signs are stable alert and oriented x3.  Pulses are palpable.  Severe pain on palpation distalmost aspect of the Achilles tendon.  This is noted bilateral.  No open lesions or wounds noted.  Assessment: Distal tendinitis.  Plan: Injected subcutaneous tissue overlying the distal lateral aspect of the Achilles tendon with 2 mg of dexamethasone and 2 mg of Marcaine.  Also provided him into compression anklet.

## 2017-09-06 DIAGNOSIS — R0789 Other chest pain: Secondary | ICD-10-CM | POA: Diagnosis not present

## 2017-09-06 DIAGNOSIS — R0609 Other forms of dyspnea: Secondary | ICD-10-CM | POA: Diagnosis not present

## 2017-09-06 DIAGNOSIS — I10 Essential (primary) hypertension: Secondary | ICD-10-CM | POA: Diagnosis not present

## 2017-09-12 DIAGNOSIS — G4733 Obstructive sleep apnea (adult) (pediatric): Secondary | ICD-10-CM | POA: Diagnosis not present

## 2017-09-17 DIAGNOSIS — I209 Angina pectoris, unspecified: Secondary | ICD-10-CM | POA: Diagnosis not present

## 2017-09-17 DIAGNOSIS — E782 Mixed hyperlipidemia: Secondary | ICD-10-CM | POA: Diagnosis not present

## 2017-09-17 DIAGNOSIS — G4733 Obstructive sleep apnea (adult) (pediatric): Secondary | ICD-10-CM | POA: Diagnosis not present

## 2017-09-17 DIAGNOSIS — I10 Essential (primary) hypertension: Secondary | ICD-10-CM | POA: Diagnosis not present

## 2017-09-25 ENCOUNTER — Other Ambulatory Visit: Payer: Self-pay | Admitting: Medical

## 2017-10-01 ENCOUNTER — Ambulatory Visit: Payer: BLUE CROSS/BLUE SHIELD | Admitting: Skilled Nursing Facility1

## 2017-10-01 ENCOUNTER — Ambulatory Visit: Payer: BLUE CROSS/BLUE SHIELD | Admitting: Medical

## 2017-10-01 ENCOUNTER — Encounter: Payer: Self-pay | Admitting: Medical

## 2017-10-01 VITALS — BP 124/84 | HR 68 | Temp 97.8°F | Ht 66.75 in | Wt 344.4 lb

## 2017-10-01 DIAGNOSIS — I10 Essential (primary) hypertension: Secondary | ICD-10-CM | POA: Diagnosis not present

## 2017-10-01 DIAGNOSIS — H9209 Otalgia, unspecified ear: Secondary | ICD-10-CM

## 2017-10-01 DIAGNOSIS — H1013 Acute atopic conjunctivitis, bilateral: Secondary | ICD-10-CM | POA: Diagnosis not present

## 2017-10-01 DIAGNOSIS — J301 Allergic rhinitis due to pollen: Secondary | ICD-10-CM | POA: Insufficient documentation

## 2017-10-01 DIAGNOSIS — J029 Acute pharyngitis, unspecified: Secondary | ICD-10-CM | POA: Diagnosis not present

## 2017-10-01 MED ORDER — AMOXICILLIN 875 MG PO TABS: 875.0000 mg | ORAL_TABLET | Freq: Two times a day (BID) | ORAL | 0 refills | Status: DC

## 2017-10-01 MED ORDER — HYDROXYZINE HCL 10 MG PO TABS: ORAL_TABLET | ORAL | 1 refills | Status: DC

## 2017-10-01 MED ORDER — ONDANSETRON HCL 4 MG PO TABS: 4.0000 mg | ORAL_TABLET | Freq: Three times a day (TID) | ORAL | 0 refills | Status: DC | PRN

## 2017-10-01 NOTE — Progress Notes (Signed)
Subjective: Chief Complaint  Patient presents with  . Acute Visit    left ear ache, last night sore throat, dizziness started this morning, threw up this morning after breakfast.   Here for left ear ache, sore throat, some dizziness this morning.  Been feeling bad several days.  Ate breakfast this morning, felt nauseated and vomited.   No fever, no cough, doesn't feel congested.   No sick contacts.    Since last visit saw Dr. Jacinto Halim, had stress test, echo, EKG, had some med changes including higher dose of pravastatin and continued on coreg and added amlodipine.  He says lisinopril was stopped.  Was given NTG prn, has some "blockages down low."  No other aggravating or relieving factors. No other complaint.   Past Medical History:  Diagnosis Date  . Anxiety   . Arthritis   . Gout   . Hypertension   . Kidney stones age 21 to 84   throughout life several times, on allopurinol prophylaxis  . Neuropathy    mainly left leg , but bilat legs s/p rapid weight loss and gastric bypass surgery  . Obese   . OSA on CPAP    uses cpap   Current Outpatient Medications on File Prior to Visit  Medication Sig Dispense Refill  . allopurinol (ZYLOPRIM) 300 MG tablet Take 1 tablet (300 mg total) every morning by mouth. 90 tablet 1  . amLODipine (NORVASC) 5 MG tablet TK 1 T PO HS  1  . busPIRone (BUSPAR) 15 MG tablet Take 1 tablet (15 mg total) 2 (two) times daily by mouth. 180 tablet 1  . Calcium Citrate-Vitamin D 500-500 MG-UNIT CHEW Chew by mouth 3 (three) times daily.    . carvedilol (COREG) 3.125 MG tablet TAKE 1 TABLET BY MOUTH TWICE DAILY WITH A MEAL 180 tablet 0  . gabapentin (NEURONTIN) 300 MG capsule Take 1 capsule (300 mg total) 3 (three) times daily by mouth. 1 by mouth at night as needed may increase to 3 at night as needed 270 capsule 3  . loratadine (CLARITIN) 10 MG tablet Take 10 mg by mouth daily.    . methylPREDNISolone (MEDROL) 4 MG TBPK tablet Take as directed 21 tablet 0  . Multiple  Vitamin (MULTIVITAMIN WITH MINERALS) TABS Take 2 tablets by mouth every morning.     . nitroGLYCERIN (NITROSTAT) 0.4 MG SL tablet DIS 1 T UNT Q 5 MIN PRN FOR CP  1  . omeprazole (PRILOSEC) 40 MG capsule TAKE ONE CAPSULE BY MOUTH DAILY 30 capsule 2  . pravastatin (PRAVACHOL) 20 MG tablet TAKE 1 TABLET BY MOUTH EVERY EVENING 90 tablet 0  . traMADol (ULTRAM) 50 MG tablet Take 1 tablet (50 mg total) by mouth every 8 (eight) hours as needed. 30 tablet 0  . Zinc 50 MG TABS Take 1 tablet by mouth every morning.     Marland Kitchen albuterol (PROVENTIL HFA;VENTOLIN HFA) 108 (90 Base) MCG/ACT inhaler Inhale 2 puffs into the lungs every 6 (six) hours as needed for wheezing or shortness of breath. (Patient not taking: Reported on 10/01/2017) 1 Inhaler 0  . lisinopril (PRINIVIL,ZESTRIL) 20 MG tablet Take 1 tablet (20 mg total) daily by mouth. (Patient not taking: Reported on 10/01/2017) 90 tablet 1   Current Facility-Administered Medications on File Prior to Visit  Medication Dose Route Frequency Provider Last Rate Last Dose  . betamethasone acetate-betamethasone sodium phosphate (CELESTONE) injection 3 mg  3 mg Intramuscular Once Felecia Shelling, DPM       ROS  as in subjective   Objective: BP 124/84 (BP Location: Right Arm, Patient Position: Sitting, Cuff Size: Normal)   Pulse 68   Temp 97.8 F (36.6 C) (Oral)   Ht 5' 6.75" (1.695 m)   Wt (!) 344 lb 6.4 oz (156.2 kg)   SpO2 94%   BMI 54.35 kg/m   Wt Readings from Last 3 Encounters:  10/01/17 (!) 344 lb 6.4 oz (156.2 kg)  08/03/17 (!) 342 lb 3.2 oz (155.2 kg)  05/14/17 (!) 345 lb 9.6 oz (156.8 kg)   General appearance: alert, no distress, WD/WN,  HEENT: normocephalic, sclerae anicteric, bilat injected conjunctiva, TMs pearly, nares with mild turbinated edema, clear discharge, mild erythema, pharynx with post nasal drainage and mild erythema Oral cavity: MMM, no lesions Neck: supple, no lymphadenopathy, no thyromegaly, no masses Lungs: CTA bilaterally, no  wheezes, rhonchi, or rales    Assessment: Encounter Diagnoses  Name Primary?  . Sore throat Yes  . Otalgia, unspecified laterality   . HYPERTENSION, BENIGN   . Allergic rhinitis due to pollen, unspecified seasonality   . Allergic conjunctivitis of both eyes      Plan: Discussed symptoms, concerns, exam findings.  Gave the following recommendations.  Patient Instructions  Recommendations: STOP Claritin Begin Hydroxyzine tablet twice daily for 4 days for allergies, congestion and dizziness, then change to once daily for the next month You can use Zofran as needed for nausea Rest, hydrate well with water If needed you can use over the counter allergy eye drops for eye redness and irritation If your ear pain and throat symptoms don't iimprovein the next 3-4 days, you can begin Amoxicillin but I currently feel that allergies is the main issue     Chrissie NoaWilliam was seen today for acute visit.  Diagnoses and all orders for this visit:  Sore throat  Otalgia, unspecified laterality  HYPERTENSION, BENIGN  Allergic rhinitis due to pollen, unspecified seasonality  Allergic conjunctivitis of both eyes  Other orders -     hydrOXYzine (ATARAX/VISTARIL) 10 MG tablet; Twice daily for 4 days then once daily -     ondansetron (ZOFRAN) 4 MG tablet; Take 1 tablet (4 mg total) by mouth every 8 (eight) hours as needed for nausea or vomiting. -     amoxicillin (AMOXIL) 875 MG tablet; Take 1 tablet (875 mg total) by mouth 2 (two) times daily.

## 2017-10-01 NOTE — Patient Instructions (Signed)
Recommendations: STOP Claritin Begin Hydroxyzine tablet twice daily for 4 days for allergies, congestion and dizziness, then change to once daily for the next month You can use Zofran as needed for nausea Rest, hydrate well with water If needed you can use over the counter allergy eye drops for eye redness and irritation If your ear pain and throat symptoms don't iimprovein the next 3-4 days, you can begin Amoxicillin but I currently feel that allergies is the main issue

## 2017-10-04 DIAGNOSIS — S7001XA Contusion of right hip, initial encounter: Secondary | ICD-10-CM | POA: Diagnosis not present

## 2017-10-08 ENCOUNTER — Ambulatory Visit: Payer: BLUE CROSS/BLUE SHIELD | Admitting: Skilled Nursing Facility1

## 2017-10-10 ENCOUNTER — Encounter: Payer: Self-pay | Admitting: Medical

## 2017-10-16 ENCOUNTER — Other Ambulatory Visit: Payer: Self-pay | Admitting: Medical

## 2017-10-22 ENCOUNTER — Encounter: Payer: BLUE CROSS/BLUE SHIELD | Attending: General Surgery | Admitting: Skilled Nursing Facility1

## 2017-10-22 ENCOUNTER — Encounter: Payer: Self-pay | Admitting: Skilled Nursing Facility1

## 2017-10-22 DIAGNOSIS — E669 Obesity, unspecified: Secondary | ICD-10-CM

## 2017-10-22 DIAGNOSIS — Z6841 Body Mass Index (BMI) 40.0 and over, adult: Secondary | ICD-10-CM | POA: Insufficient documentation

## 2017-10-22 DIAGNOSIS — Z713 Dietary counseling and surveillance: Secondary | ICD-10-CM | POA: Insufficient documentation

## 2017-10-22 NOTE — Patient Instructions (Signed)
-  First step:  Identify when you are about to eat  -Second step:  Read your mindful eating sheet  -Third step:  If it is not hunger read your needs/emotions sheet and identify what is driving your want to eat  -Fourth step:  Fill that need/emotion appropriately    -Talk with your wife about how she can help you: start with: it makes me feel ________ when you say this ________

## 2017-10-22 NOTE — Progress Notes (Signed)
Post-Operative RYGB Surgery  Medical Nutrition Therapy:  Appt start time: 1140 end time: 1200  Primary concerns today: Post-operative Bariatric Surgery Nutrition Management.  Pt states he has not been working on anything discussed from the previous appointment.  Pt states there has been a small blockage found in his heart is now on nitroglycerin due to episodes of chest and shoulder pain and shortness of breath. Pt states he is having these episodes 2 a week. Pt states his son and daughter are special needs and is under a lot of stress. Pt states he has put his foot down to get the whole family healthy. Pt states he is taking metamuceil one time a day to help his CVD numbers. Pt states his persisioners at church and his wife do not celebrate the successes he has already had and talks about him needing to lose more weight which is upsetting and adds more stress. Pt states he is disapointed in [facility] because it has been 2 years since he has met with his surgeon: Dietitian advised he call [facility] to make an appointment.  During the education the pt asked a question irrelevant to the conversation which was about doing the "pouch reset diet" which is a hint the pt was not full listening to the pt even though he was able to regurgitate what was just discussed.    TANITA  BODY COMP RESULTS  11/16/2016 04/02/2017   BMI (kg/m^2) 50.5 50.3   Fat Mass (lbs) 152 172.2   Fat Free Mass (lbs) 190.2 168.2   Total Body Water (lbs) 151     Surgery date: 05/26/2014 Surgery type: RYGB Start weight at [facility]: 412 on 02/28/14 (highest weight 470 lbs per patient) Weight today: 340.4 lbs  Weight loss goal: 280 lbs to have bone spur surgery in his feet  24-hr recall: B (AM): 1 to 2 scrambled eggs, 1 pieces [place] bacon or corned beef hash, 1 piece toast Snk (AM): 3-4 pringle's with water L (PM):bacon cheese hamburger or chicken sandwich Snk (PM): P3 pack or Atkins protein shake (13-15g)---chips or nuts D (PM): salsbury  steak and corn on the cob and green beans or mashed potatoes with coke zero Snk (PM): small bag of popcorn  Fluid intake: water with flavorings, sweet tea, gatorade zero, zero soda Estimated total protein intake: 90-105 g  Medications: see list Supplementation: bariatric advantage, zinc, vitamin d, vitamin B12, calcium   Using straws: No Drinking while eating: trying not to Hair loss: yes  Carbonated beverages: No N/V/D/C: none Dumping syndrome: yes after drinking too fast and too soon after eating Having you been chewing well: yes Chewing/swallowing difficulties: no Changes in vision: no Changes to mood/headaches: no Hair loss/Cahnges to skin/Changes to nails: no Any difficulty focusing or concentrating: no Sweating: no Dizziness/Lightheaded: no Palpitations: no  Carbonated beverages: yes  Recent physical activity: 3 days a week for 45 minutes  Progress Towards Goal(s):  In progress.  Handout provided: pre op diet   Nutritional Diagnosis:  [place]-3.3 Overweight/obesity related to past poor dietary habits and physical inactivity as evidenced by patient w/ recent RYGB surgery following dietary guidelines for continued weight loss.    Intervention:  Nutrition education/diet reinforcement. Goals: -First step:  Identify when you are about to eat -Second step:  Read your mindful eating sheet -Third step:  If it is not hunger read your needs/emotions sheet and identify what is driving your want to eat -Fourth step:  Fill that need/emotion appropriately -Talk with your wife about how  she can help you: start with: it makes me feel ________ when you say this ________  Teaching Method Utilized:  Visual Auditory Hands on  Barriers to learning/adherence to lifestyle change: none  Demonstrated degree of understanding via:  Teach Back   Monitoring/Evaluation:  Dietary intake, exercise, and body weight. Follow up in 1 months.

## 2017-10-25 ENCOUNTER — Telehealth: Payer: Self-pay | Admitting: Medical

## 2017-10-25 ENCOUNTER — Other Ambulatory Visit: Payer: Self-pay | Admitting: Medical

## 2017-10-25 DIAGNOSIS — M25562 Pain in left knee: Secondary | ICD-10-CM | POA: Diagnosis not present

## 2017-10-25 MED ORDER — PRAVASTATIN SODIUM 40 MG PO TABS: 40.0000 mg | ORAL_TABLET | Freq: Every evening | ORAL | 0 refills | Status: DC

## 2017-10-25 NOTE — Telephone Encounter (Signed)
I was reviewed back through his chart.  Per cardiology, when he runs out of his current Pravachol, have him change to  higher dose instead of the  dose.   This is to lower heart disease risks given family history.

## 2017-10-26 NOTE — Telephone Encounter (Signed)
lmom asking pt to call the office to discuss the medication change per provider.

## 2017-10-26 NOTE — Telephone Encounter (Signed)
Pt has been informed of providers note.  

## 2017-10-29 ENCOUNTER — Encounter: Payer: Self-pay | Admitting: Medical

## 2017-10-29 DIAGNOSIS — I10 Essential (primary) hypertension: Secondary | ICD-10-CM | POA: Diagnosis not present

## 2017-10-29 DIAGNOSIS — H01005 Unspecified blepharitis left lower eyelid: Secondary | ICD-10-CM | POA: Diagnosis not present

## 2017-10-29 DIAGNOSIS — H52203 Unspecified astigmatism, bilateral: Secondary | ICD-10-CM | POA: Diagnosis not present

## 2017-10-29 DIAGNOSIS — I209 Angina pectoris, unspecified: Secondary | ICD-10-CM | POA: Diagnosis not present

## 2017-10-29 DIAGNOSIS — H524 Presbyopia: Secondary | ICD-10-CM | POA: Diagnosis not present

## 2017-10-29 DIAGNOSIS — E782 Mixed hyperlipidemia: Secondary | ICD-10-CM | POA: Diagnosis not present

## 2017-10-29 DIAGNOSIS — H01001 Unspecified blepharitis right upper eyelid: Secondary | ICD-10-CM | POA: Diagnosis not present

## 2017-11-01 NOTE — H&P (Signed)
OFFICE VISIT NOTES COPIED TO EPIC FOR DOCUMENTATION  . History of Present Illness [name] FNP-C; November 23, 2017 9:11 PM) Patient words: Last O/V 09/17/2017; 6 Week F/U for Angina Pectoris - Pt is taking Ntg 3 x weekly.  The patient is a 47 year old male who presents for a Follow-up for Chest pain. Justin [name] is a 47 year old Caucasian male with past medical history of obstructive sleep apnea on CPAP, hypertension, arthritis, hyperlipidemia, obesity with previous gastric bypass surgery in 2015 with 150 pound weight loss; however, has not lost significant amount of weight over the last year. Has symptoms concerning for angina pectoris, underwent Lexiscan nuclear stress test 09/07/2017 revealed small perfusion abnormality in the mid inferior, mid inferolateral, and apical inferior wall. Considered low risk study. Echocardiogram 08/17/2017 revealed moderate LVH, otherwise normal.  Due to continued symtpoms was started on Amlodipine. Lisinopril was discontinued for risk of hypotension given borderline low BP. He now presents for 6 week follow up. He continues to have chest pain that is nitroglycerin responsive and has been using 3-4 times a week. Episodes generally occur at rest during stressful situations and has occured twice while walking. Has been trying to lose weight with exercise and is walking 1-3 miles 3 days a week.     Problem List/Past Medical (April Garrison; 11/23/2017 10:45 AM) Atypical chest pain (R07.89)  AC (acromioclavicular) joint bone spurs (M75.80)  heels Anxiety (F41.9)  GERD (gastroesophageal reflux disease) (K21.9)  Laboratory examination (Z01.89)  Labs 05/03/2017: Creatinine 0.8, potassium 4.6, CMP normal. CBC normal. Cholesterol 205, HDL 44, triglycerides 197, LDL 127. Non-HDL 161. Hemoglobin A1c 5.4%. Benign essential hypertension (I10)  Echo 09/06/2017: 1. Left ventricle cavity is normal in size. Moderate concentric hypertrophy of the left ventricle. Normal  global wall motion. Visual EF is 50-55%. Normal diastolic filling pattern. 2. Mild (Grade I) mitral regurgitation. 3. Trace tricuspid regurgitation. Hyperlipidemia, mixed (E78.2)  Obstructive sleep apnea syndrome (G47.33)  compliant with CPAP. Managed by PCP Angina pectoris (I20.9)  EKG 08/16/2017: Normal sinus rhythm at rate of 65 bpm, left atrial enlargement, normal axis. No evidence of ischemia otherwise normal EKG. Lexiscan Sestamibi stress test 09/07/2017: 1. Pharmacologic stress testing was performed with intravenous administration of .4 mg of Lexiscan over a 10-15 seconds infusion. Normal blood pressure. Stress symptoms included dyspnea, dizziness. Exercise capacity not assessed. Stress EKG is non diagnostic for ischemia as it is a pharmacologic stress. 2. Left ventricular cavity is noted to be normal on the rest and stress studies. Gated SPECT images reveal normal myocardial thickening and wall motion. The left ventricular ejection fraction was calculated or visually estimated to be 77%. SPECT images demonstrate small perfusion abnormality of mild intensity in the mid inferior, mid inferolateral and apical inferior myocardial wall(s) on the stress images, that is reversible on rest images. Findings suggest mild ischemia. 3. Low risk study. Class 3 severe obesity due to excess calories without serious comorbidity with body mass index (BMI) of 50.0 to 59.9 in adult (E66.01)  History of gastric bypass (Z98.84) [2015]: 150 lb weight loss  Allergies (April Garrison; 11-23-17 10:45 AM) LaMICtal *ANTICONVULSANTS*  flu like symptoms TEGretol *ANTICONVULSANTS*  flu like symptoms Sulfa Drugs  Hives.  Family History (April Rinaldo Ratel; Nov 23, 2017 10:45 AM) Mother  Deceased. at age 47 from brain aneurysm, heart disease, enlarged heart, stents Paternal Grandfather  quad bypass surgery at age 3 years, died at 45 years of age with ruptured AAA Father  In stable health. gout, htn, alcoholic and drug  addict,  smoker, kidney disease, no heart attacks or strokes, no known cardiovascular conditions Maternal Grandfather  widow maker  Social History (April Garrison; 11-07-2017 10:45 AM) Current tobacco use  Never smoker. Non Drinker/No Alcohol Use  Marital status  Married. Living Situation  Lives with spouse. Number of Children  2.  Past Surgical History (April Garrison; 11-07-2017 10:45 AM) Gastric Bypass [2015]: Arthroscopic Knee Surgery - Left [2017]: Gallbladder Surgery [2017]:  Medication History [name], FNP-C; Nov 07, 2017 9:18 PM) Nitroglycerin (0.4MG  Tab Sublingual, 1 (one) Tablet Sublingual every 5 minutes as needed for chest pain., Taken starting 09/17/2017) Active. AmLODIPine Besylate (  Tablet, 1 (one) Tablet Oral at bedtime, Taken starting 09/17/2017) Active. Pravastatin Sodium (  Tablet, 1 Tablet Oral daily, Taken starting 09/17/2017) Active. (increased dose) Omeprazole (  Capsule DR, 1 Oral daily) Active. Allopurinol (  Tablet, 1 Oral daily) Active. Carvedilol (3.125MG  Tablet, 1 Oral two times daily) Active. BusPIRone HCl (  Tablet, 1 Oral two times daily) Active. B12 Fast Dissolve ( Tablet Disint, 1 Oral daily) Active. (sublingual) Vitamin D3 (2000UNIT Tablet, 1 Oral daily) Active. Flinstones Gummies Omega-3 DHA (2 Oral daily) Active. TraMADol HCl (  Tablet, 1-2 tab Oral prn) Active. Loratadine (  Tablet, 1 Oral daily) Active. Gabapentin (  Capsule, 1 Oral three times daily) Active. Ondansetron HCl (  Tablet, 1 Oral as needed) Active. hydrOXYzine HCl (  Tablet, 1 Oral as needed) Active. Medications Reconciled (verbally with pt.)  Diagnostic Studies History (April Garrison; 2017-11-07 10:45 AM) Sleep Study [2017]: sleep apnea/cpap Echocardiogram [09/06/2017]: Echo- 09/06/2017 1. Left ventricle cavity is normal in size. Moderate concentric hypertrophy of the left ventricle. Normal global wall motion.  Visual EF is 50-55%. Normal diastolic filling pattern. 2. Mild (Grade I) mitral regurgitation. 3. Trace tricuspid regurgitation. Abdominal Ultrasound [2015]: Nuclear stress test [09/07/2017]: Lexiscan Sestamibi stress test 09/07/2017: 1. Pharmacologic stress testing was performed with intravenous administration of .4 mg of Lexiscan over a 10-15 seconds infusion. Normal blood pressure. Stress symptoms included dyspnea, dizziness. Exercise capacity not assessed. Stress EKG is non diagnostic for ischemia as it is a pharmacologic stress. 2. Left ventricular cavity is noted to be normal on the rest and stress studies. Gated SPECT images reveal normal myocardial thickening and wall motion. The left ventricular ejection fraction was calculated or visually estimated to be 77%. SPECT images demonstrate small perfusion abnormality of mild intensity in the mid inferior, mid inferolateral and apical inferior myocardial wall(s) on the stress images. 3. Low risk study. Coronary Angiogram  09/20/2013: Normal LV systolic function, normal coronary arteries. MRI [2019]: left knee Echocardiogram  Echocardiogram 09/16/2013: Normal LV systolic function, EF 60-65%, moderate LVH. Mild left atrial enlargement. No significant valvular abnormality.    Review of Systems [name], FNP-C; Nov 07, 2017 9:18 PM) General Not Present- Appetite Loss and Weight Gain. Respiratory Not Present- Chronic Cough and Wakes up from Sleep Wheezing or Short of Breath. Cardiovascular Present- Chest Pain (occasional episodes of chest tightness with radiation to left shoulder at rest), Difficulty Breathing On Exertion and Shortness of Breath. Not Present- Difficulty Breathing Lying Down and Edema. Gastrointestinal Present- Nausea (occasional after eating). Not Present- Black, Tarry Stool and Difficulty Swallowing. Musculoskeletal Present- Joint Pain and Muscle Pain (left thigh area related to neuropathy). Not Present- Decreased Range  of Motion and Muscle Atrophy. Neurological Not Present- Attention Deficit. Psychiatric Not Present- Personality Changes and Suicidal Ideation. Endocrine Not Present- Cold Intolerance and Heat Intolerance. Hematology Not Present- Abnormal Bleeding. All other systems negative  Vitals (April Garrison; 07-Nov-2017 10:52 AM) 07-Nov-2017 10:45 AM Weight: 337.25 lb  Height: 69in Body Surface Area: 2.58 m Body Mass Index: 49.8 kg/m  Pulse: 87 (Regular)  P.OX: 97% (Room air) BP: 98/72 (Sitting, Left Arm, Standard)       Physical Exam [name], FNP-C; 10/29/2017 9:18 PM) General Mental Status-Alert. General Appearance-Cooperative, Appears stated age, Not in acute distress. Build & Nutrition-Well built and Morbidly obese.  Head and Neck Neck -Note: Short neck and difficult to evaluate JVD.  Thyroid Gland Characteristics - no palpable nodules, no palpable enlargement.  Cardiovascular Cardiovascular examination reveals -normal heart sounds, regular rate and rhythm with no murmurs. Inspection Jugular vein - Right - No Distention.  Abdomen Inspection Contour - Obese and Pannus present. Palpation/Percussion Normal exam - Non Tender and No hepatosplenomegaly. Auscultation Normal exam - Bowel sounds normal.  Peripheral Vascular Lower Extremity Inspection - Bilateral - Inspection Normal. Palpation - Edema - Bilateral - No edema. Femoral pulse - Bilateral - Feeble(Pulsus difficult to feel due to patient's bodily habitus.), No Bruits. Popliteal pulse - Bilateral - Feeble(Pulsus difficult to feel due to patient's bodily habitus.). Dorsalis pedis pulse - Bilateral - Normal. Posterior tibial pulse - Bilateral - Normal. Carotid arteries - Bilateral-No Carotid bruit.  Neurologic Neurologic evaluation reveals -alert and oriented x 3 with no impairment of recent or remote memory. Motor-Grossly intact without any focal deficits.  Musculoskeletal Global  Assessment Left Lower Extremity - normal range of motion without pain. Right Lower Extremity - normal range of motion without pain.    Assessment & Plan [name] FNP-C; 10/29/2017 9:17 PM) Angina pectoris (I20.9) Story: EKG 08/16/2017: Normal sinus rhythm at rate of 65 bpm, left atrial enlargement, normal axis. No evidence of ischemia otherwise normal EKG.  Lexiscan Sestamibi stress test 09/07/2017: 1. Pharmacologic stress testing was performed with intravenous administration of .4 mg of Lexiscan over a 10-15 seconds infusion. Normal blood pressure. Stress symptoms included dyspnea, dizziness. Exercise capacity not assessed. Stress EKG is non diagnostic for ischemia as it is a pharmacologic stress. 2. Left ventricular cavity is noted to be normal on the rest and stress studies. Gated SPECT images reveal normal myocardial thickening and wall motion. The left ventricular ejection fraction was calculated or visually estimated to be 77%. SPECT images demonstrate small perfusion abnormality of mild intensity in the mid inferior, mid inferolateral and apical inferior myocardial wall(s) on the stress images, that is reversible on rest images. Findings suggest mild ischemia. 3. Low risk study. Impression: Coronary angiogram 09/20/2013: Normal LV systolic function, normal coronary arteries.  Echocardiogram 09/16/2013: Normal LV systolic function, EF 60-65%, moderate LVH. Mild left atrial enlargement. No significant valvular abnormality. Current Plans METABOLIC PANEL, BASIC (11914) CBC & PLATELETS (AUTO) (78295) Benign essential hypertension (I10) Story: Echo 09/06/2017: 1. Left ventricle cavity is normal in size. Moderate concentric hypertrophy of the left ventricle. Normal global wall motion. Visual EF is 50-55%. Normal diastolic filling pattern. 2. Mild (Grade I) mitral regurgitation. 3. Trace tricuspid regurgitation. Hyperlipidemia, mixed (E78.2) Class 3 severe obesity due to excess  calories without serious comorbidity with body mass index (BMI) of 50.0 to 59.9 in adult (E66.01) Laboratory examination (Z01.89) Story: Labs 05/03/2017: Creatinine 0.8, potassium 4.6, CMP normal. CBC normal. Cholesterol 205, HDL 44, triglycerides 197, LDL 127. Non-HDL 161. Hemoglobin A1c 5.4%.  Note:. Recommendation:  Patient presents for follow up for symptoms of angina pectoris. He has continued to have symptoms that are nitroglycerin responsive. Tolerating amlodipine well, but does have facial flusing in the afternoons that is likely vasodilation side effect. Although coronary angiogram in  2015 was normal, in view of his risk factors and mildly abnormal stress test, I am concerned about his continued symptoms. Will schedule him for coronary angiogram for further evaluation. Schedule for cardiac catheterization, and possible angioplasty. We discussed regarding risks, benefits, alternatives to this including stress testing, CTA and continued medical therapy. Patient wants to proceed. Understands <1-2% risk of death, stroke, MI, urgent CABG, bleeding, infection, renal failure but not limited to these. If angiogram is normal will need evaluation for non-cardiac etiology of his chest pain.  Blood pressure has remained stable and he is tolerating increased dose of Pravastatin. If he does have coronary disease, will consider switching to high potency statin. Discussed importance of continued diet modifications for weight loss. We will see him back after the procedure for further recommendations and evaluation.  *I have discussed this case with Dr. Rosemary Holms and he personally examined the patient and participated in formulating the plan.*  CC: Crosby Oyster, PA-C    Signed by [name], FNP-C (10/29/2017 9:18 PM)

## 2017-11-02 ENCOUNTER — Encounter (HOSPITAL_COMMUNITY)
Admission: RE | Disposition: A | Discharge status: Home / Self Care | Payer: Self-pay | Source: Ambulatory Visit | Attending: Cardiology

## 2017-11-02 ENCOUNTER — Ambulatory Visit (HOSPITAL_COMMUNITY)
Admission: RE | Admit: 2017-11-02 | Discharge: 2017-11-02 | Disposition: A | Discharge status: Home / Self Care | Payer: BLUE CROSS/BLUE SHIELD | Source: Ambulatory Visit | Attending: Cardiology | Admitting: Cardiology

## 2017-11-02 DIAGNOSIS — R079 Chest pain, unspecified: Secondary | ICD-10-CM | POA: Diagnosis present

## 2017-11-02 DIAGNOSIS — Z79899 Other long term (current) drug therapy: Secondary | ICD-10-CM | POA: Insufficient documentation

## 2017-11-02 DIAGNOSIS — Z9889 Other specified postprocedural states: Secondary | ICD-10-CM | POA: Insufficient documentation

## 2017-11-02 DIAGNOSIS — E669 Obesity, unspecified: Secondary | ICD-10-CM | POA: Diagnosis not present

## 2017-11-02 DIAGNOSIS — Z8249 Family history of ischemic heart disease and other diseases of the circulatory system: Secondary | ICD-10-CM | POA: Insufficient documentation

## 2017-11-02 DIAGNOSIS — E782 Mixed hyperlipidemia: Secondary | ICD-10-CM | POA: Insufficient documentation

## 2017-11-02 DIAGNOSIS — R0789 Other chest pain: Secondary | ICD-10-CM | POA: Insufficient documentation

## 2017-11-02 DIAGNOSIS — G4733 Obstructive sleep apnea (adult) (pediatric): Secondary | ICD-10-CM | POA: Diagnosis not present

## 2017-11-02 DIAGNOSIS — I081 Rheumatic disorders of both mitral and tricuspid valves: Secondary | ICD-10-CM | POA: Insufficient documentation

## 2017-11-02 DIAGNOSIS — R072 Precordial pain: Secondary | ICD-10-CM | POA: Diagnosis not present

## 2017-11-02 DIAGNOSIS — E785 Hyperlipidemia, unspecified: Secondary | ICD-10-CM | POA: Diagnosis not present

## 2017-11-02 DIAGNOSIS — K219 Gastro-esophageal reflux disease without esophagitis: Secondary | ICD-10-CM | POA: Diagnosis not present

## 2017-11-02 DIAGNOSIS — Z6841 Body Mass Index (BMI) 40.0 and over, adult: Secondary | ICD-10-CM | POA: Insufficient documentation

## 2017-11-02 DIAGNOSIS — Z9884 Bariatric surgery status: Secondary | ICD-10-CM | POA: Insufficient documentation

## 2017-11-02 DIAGNOSIS — I10 Essential (primary) hypertension: Secondary | ICD-10-CM | POA: Diagnosis not present

## 2017-11-02 HISTORY — PX: LEFT HEART CATH AND CORONARY ANGIOGRAPHY: CATH118249

## 2017-11-02 SURGERY — LEFT HEART CATH AND CORONARY ANGIOGRAPHY
Anesthesia: LOCAL

## 2017-11-02 MED ORDER — ONDANSETRON HCL 4 MG/2ML IJ SOLN: 4.0000 mg | Freq: Four times a day (QID) | INTRAMUSCULAR | Status: DC | PRN

## 2017-11-02 MED ORDER — HEPARIN (PORCINE) IN NACL 2-0.9 UNITS/ML
INTRAMUSCULAR | Status: AC | PRN
  Administered 2017-11-02 (×2): 500 mL via INTRA_ARTERIAL

## 2017-11-02 MED ORDER — SODIUM CHLORIDE 0.9 % WEIGHT BASED INFUSION
3.0000 mL/kg/h | INTRAVENOUS | Status: DC
  Administered 2017-11-02: 3 mL/kg/h via INTRAVENOUS

## 2017-11-02 MED ORDER — IOPAMIDOL (ISOVUE-370) INJECTION 76%
INTRAVENOUS | Status: AC
  Filled 2017-11-02: qty 100

## 2017-11-02 MED ORDER — SODIUM CHLORIDE 0.9 % IV SOLN: 250.0000 mL | INTRAVENOUS | Status: DC | PRN

## 2017-11-02 MED ORDER — SODIUM CHLORIDE 0.9% FLUSH: 3.0000 mL | INTRAVENOUS | Status: DC | PRN

## 2017-11-02 MED ORDER — MIDAZOLAM HCL 2 MG/2ML IJ SOLN
INTRAMUSCULAR | Status: DC | PRN
  Administered 2017-11-02: 2 mg via INTRAVENOUS

## 2017-11-02 MED ORDER — ASPIRIN 81 MG PO CHEW
81.0000 mg | CHEWABLE_TABLET | ORAL | Status: AC
  Administered 2017-11-02: 81 mg via ORAL

## 2017-11-02 MED ORDER — LIDOCAINE HCL (PF) 1 % IJ SOLN
INTRAMUSCULAR | Status: AC
  Filled 2017-11-02: qty 30

## 2017-11-02 MED ORDER — VERAPAMIL HCL 2.5 MG/ML IV SOLN
INTRAVENOUS | Status: DC | PRN
  Administered 2017-11-02: 09:00:00 via INTRA_ARTERIAL

## 2017-11-02 MED ORDER — SODIUM CHLORIDE 0.9% FLUSH: 3.0000 mL | Freq: Two times a day (BID) | INTRAVENOUS | Status: DC

## 2017-11-02 MED ORDER — ACETAMINOPHEN 325 MG PO TABS: 650.0000 mg | ORAL_TABLET | ORAL | Status: DC | PRN

## 2017-11-02 MED ORDER — HEPARIN (PORCINE) IN NACL 1000-0.9 UT/500ML-% IV SOLN
INTRAVENOUS | Status: AC
  Filled 2017-11-02: qty 500

## 2017-11-02 MED ORDER — HYDROMORPHONE HCL 1 MG/ML IJ SOLN
INTRAMUSCULAR | Status: AC
  Filled 2017-11-02: qty 0.5

## 2017-11-02 MED ORDER — HEPARIN (PORCINE) IN NACL 1000-0.9 UT/500ML-% IV SOLN
INTRAVENOUS | Status: AC
  Filled 2017-11-02: qty 1000

## 2017-11-02 MED ORDER — SODIUM CHLORIDE 0.9 % WEIGHT BASED INFUSION: 1.0000 mL/kg/h | INTRAVENOUS | Status: DC

## 2017-11-02 MED ORDER — HYDROMORPHONE HCL 1 MG/ML IJ SOLN
INTRAMUSCULAR | Status: DC | PRN
  Administered 2017-11-02: 0.5 mg via INTRAVENOUS

## 2017-11-02 MED ORDER — ASPIRIN 81 MG PO CHEW
CHEWABLE_TABLET | ORAL | Status: AC
  Filled 2017-11-02: qty 1

## 2017-11-02 MED ORDER — LIDOCAINE HCL (PF) 1 % IJ SOLN
INTRAMUSCULAR | Status: DC | PRN
  Administered 2017-11-02: 30 mL

## 2017-11-02 MED ORDER — HEPARIN SODIUM (PORCINE) 1000 UNIT/ML IJ SOLN
INTRAMUSCULAR | Status: DC | PRN
  Administered 2017-11-02: 5000 [IU] via INTRAVENOUS

## 2017-11-02 MED ORDER — IOPAMIDOL (ISOVUE-370) INJECTION 76%
INTRAVENOUS | Status: DC | PRN
  Administered 2017-11-02: 60 mL via INTRA_ARTERIAL

## 2017-11-02 MED ORDER — MIDAZOLAM HCL 2 MG/2ML IJ SOLN
INTRAMUSCULAR | Status: AC
  Filled 2017-11-02: qty 2

## 2017-11-02 SURGICAL SUPPLY — 10 items
CATH OPTITORQUE TIG 4.0 5F (CATHETERS) ×2 IMPLANT
DEVICE RAD COMP TR BAND LRG (VASCULAR PRODUCTS) ×2 IMPLANT
GLIDESHEATH SLEND A-KIT 6F 20G (SHEATH) ×2 IMPLANT
GUIDEWIRE INQWIRE 1.5J.035X260 (WIRE) ×1 IMPLANT
HOVERMATT SINGLE USE (MISCELLANEOUS) ×2 IMPLANT
INQWIRE 1.5J .035X260CM (WIRE) ×2
KIT HEART LEFT (KITS) ×2 IMPLANT
PACK CARDIAC CATHETERIZATION (CUSTOM PROCEDURE TRAY) ×2 IMPLANT
TRANSDUCER W/STOPCOCK (MISCELLANEOUS) ×2 IMPLANT
TUBING CIL FLEX 10 FLL-RA (TUBING) ×2 IMPLANT

## 2017-11-02 NOTE — Interval H&P Note (Signed)
History and Physical Interval Note:  11/02/2017 9:21 AM  Justin [name]  has presented today for surgery, with the diagnosis of abnormal stress test, cp  The various methods of treatment have been discussed with the patient and family. After consideration of risks, benefits and other options for treatment, the patient has consented to  Procedure(s): LEFT HEART CATH AND CORONARY ANGIOGRAPHY (N/A) as a surgical intervention .  The patient's history has been reviewed, patient examined, no change in status, stable for surgery.  I have reviewed the patient's chart and labs.  Questions were answered to the patient's satisfaction.    Symptom Status: Ischemic Symptoms Non-invasive Testing: Low risk If no or indeterminate stress test, FFR/iFR results in all diseased vessels: N/A Diabetes Mellitus: No S/P CABG: No Antianginal therapy (number of long-acting drugs): >=2 Patient undergoing renal transplant: No Patient undergoing percutaneous valve procedure: No   1 Vessel Disease No proximal LAD involvement, No proximal left dominant LCX involvement  PCI: A (7);  Indication 1  CABG: M (5);  Indication 1 Proximal left dominant LCX involvement  PCI: A (7);  Indication 4  CABG: A (7);  Indication 4 Proximal LAD involvement  PCI: A (7);  Indication 4  CABG: A (7);  Indication 4  2 Vessel Disease No proximal LAD involvement  PCI: A (7);  Indication 7  CABG: M (6);  Indication 7 Proximal LAD involvement  PCI: A (7);  Indication 10  CABG: A (7);  Indication 10  3 Vessel Disease Low disease complexity (e.g., focal stenoses, SYNTAX <=22)  PCI: A (7);  Indication 16  CABG: A (7);  Indication 16 Intermediate or high disease complexity (e.g., SYNTAX >=23)  PCI: M (6);  Indication 20  CABG: A (8);  Indication 20  Left Main Disease Isolated LMCA disease: ostial or midshaft  PCI: A (7);  Indication 24  CABG: A (9);  Indication 24 Isolated LMCA disease: bifurcation involvement  PCI: M (6);   Indication 25  CABG: A (9);  Indication 25 LMCA ostial or midshaft, concurrent low disease burden multivessel disease (e.g., 1-2 additional focal stenoses, SYNTAX <=22)  PCI: A (7);  Indication 26  CABG: A (9);  Indication 26 LMCA ostial or midshaft, concurrent intermediate or high disease burden multivessel disease (e.g., 1-2 additional bifurcation stenoses, long stenoses, SYNTAX >=23)  PCI: M (4);  Indication 27  CABG: A (9);  Indication 27 LMCA bifurcation involvement, concurrent low disease burden multivessel disease (e.g., 1-2 additional focal stenoses, SYNTAX <=22)  PCI: M (6);  Indication 28  CABG: A (9);  Indication 28 LMCA bifurcation involvement, concurrent intermediate or high disease burden multivessel disease (e.g., 1-2 additional bifurcation stenoses, long stenoses, SYNTAX >=23)  PCI: R (3);  Indication 29  CABG: A (9);  Indication 29  Notes:  A indicates appropriate. M indicates may be appropriate. R indicates rarely appropriate. Number in parentheses is median score for that indication. Reclassify indicates number of functionally diseased vessels should be decreased given negative FFR/iFR. Re-evaluate the scenario interpreting any FFR/iFR negative vessel as being not significantly stenosed.  Disease means involved vessel provides flow to a sufficient amount of myocardium to be clinically important.  If FFR testing indicates a vessel is not significant, that vessel should not be considered diseased (and the patient should be reclassified with respect to extent of functionally significant disease).  Proximal LAD + proximal left dominant LCX is considered 3 vessel CAD  2 Vessel CAD with FFR/iFR abnormal in only 1 but not  both is considered 1 vessel CAD  Disease complexity includes occlusion, bifurcation, trifurcation, ostial, >20 mm, tortuosity, calcification, thrombus  LMCA disease is >=50% by angiography, MLD <2.8 mm, MLA <6 mm2; MLA 6-7.5 mm2 requires further physiologic  See  Table B for risk stratification based on noninvasive testing  Journal of the [employer] of Cardiology Mar 2017, 23391; DOI: 10.1016/j.jacc.2017.02.001 [email or web address].full-text.pdf This App  2018 by the Society for Cardiovascular Angiography and Interventions  Adrian Prows

## 2017-11-02 NOTE — Progress Notes (Signed)
Radial band removed , 2x2 gauze with tegaderm placed, armboard placed, site unremarkable

## 2017-11-02 NOTE — Interval H&P Note (Signed)
History and Physical Interval Note:  11/02/2017 9:20 AM  Justin [name]  has presented today for surgery, with the diagnosis of abnormal stress test, cp  The various methods of treatment have been discussed with the patient and family. After consideration of risks, benefits and other options for treatment, the patient has consented to  Procedure(s): LEFT HEART CATH AND CORONARY ANGIOGRAPHY (N/A) and possible angioplastyas a surgical intervention .  The patient's history has been reviewed, patient examined, no change in status, stable for surgery.  I have reviewed the patient's chart and labs.  Questions were answered to the patient's satisfaction.     Yates Decamp

## 2017-11-02 NOTE — Discharge Instructions (Signed)

## 2017-11-05 ENCOUNTER — Other Ambulatory Visit: Payer: Self-pay | Admitting: Medical

## 2017-11-05 ENCOUNTER — Encounter (HOSPITAL_COMMUNITY): Payer: Self-pay | Admitting: Cardiology

## 2017-11-05 MED FILL — Heparin Sod (Porcine)-NaCl IV Soln 1000 Unit/500ML-0.9%: INTRAVENOUS | Qty: 1000 | Status: AC

## 2017-11-05 MED FILL — Heparin Sod (Porcine)-NaCl IV Soln 1000 Unit/500ML-0.9%: INTRAVENOUS | Qty: 500 | Status: AC

## 2017-11-07 DIAGNOSIS — M7550 Bursitis of unspecified shoulder: Secondary | ICD-10-CM | POA: Diagnosis not present

## 2017-11-07 DIAGNOSIS — R0789 Other chest pain: Secondary | ICD-10-CM | POA: Diagnosis not present

## 2017-11-07 DIAGNOSIS — Z6841 Body Mass Index (BMI) 40.0 and over, adult: Secondary | ICD-10-CM | POA: Diagnosis not present

## 2017-11-15 ENCOUNTER — Encounter: Payer: Self-pay | Admitting: Podiatry

## 2017-11-15 ENCOUNTER — Ambulatory Visit: Payer: BLUE CROSS/BLUE SHIELD | Admitting: Podiatry

## 2017-11-15 DIAGNOSIS — M722 Plantar fascial fibromatosis: Secondary | ICD-10-CM

## 2017-11-15 DIAGNOSIS — M7662 Achilles tendinitis, left leg: Secondary | ICD-10-CM

## 2017-11-15 DIAGNOSIS — M7661 Achilles tendinitis, right leg: Secondary | ICD-10-CM

## 2017-11-15 MED ORDER — METHYLPREDNISOLONE 4 MG PO TBPK: ORAL_TABLET | ORAL | 0 refills | Status: DC

## 2017-11-15 MED ORDER — HYDROCODONE-ACETAMINOPHEN 10-325 MG PO TABS: 1.0000 | ORAL_TABLET | ORAL | 0 refills | Status: DC | PRN

## 2017-11-15 NOTE — Progress Notes (Signed)
Mr. Biss presents today with a chief complaint of painful bilateral Achilles insertions.  He states that he has had a major flareup with his knee in his Achilles recently and the tramadol that his orthopedic provides him is not working.  Objective: Vital signs are stable he is alert and oriented x3.  Pulses are palpable.  Mild edema about the lower extremity and to the dorsum of the foot.  He has significant pain on palpation to the distalmost aspect of the insertion of the Achilles at the lower medial aspect of the bilateral heel.  Mild erythema no cellulitis in the area no open lesions or wounds.  Assessment: Insertional Achilles tendinitis.  Plan: Injected 2 mg dexamethasone and local anesthetic after sterile Betadine skin prep to the bilateral Achilles medial aspect.  He tolerated procedure well without complications.  Start him on a Medrol Dosepak to be followed by Vicodin.

## 2017-11-26 ENCOUNTER — Encounter: Payer: BLUE CROSS/BLUE SHIELD | Attending: General Surgery | Admitting: Skilled Nursing Facility1

## 2017-11-26 ENCOUNTER — Encounter: Payer: Self-pay | Admitting: Skilled Nursing Facility1

## 2017-11-26 DIAGNOSIS — Z713 Dietary counseling and surveillance: Secondary | ICD-10-CM | POA: Insufficient documentation

## 2017-11-26 DIAGNOSIS — Z6841 Body Mass Index (BMI) 40.0 and over, adult: Secondary | ICD-10-CM | POA: Insufficient documentation

## 2017-11-26 DIAGNOSIS — E669 Obesity, unspecified: Secondary | ICD-10-CM

## 2017-11-26 NOTE — Progress Notes (Signed)
Post-Operative RYGB Surgery  Medical Nutrition Therapy:  Appt start time: 1140 end time: 1200  Primary concerns today: Post-operative Bariatric Surgery Nutrition Management.   Pt states he has been eating healthier snacks than before and walking. Pt states he is very swollen stating he is really about 333 pounds. Pt states his doctor told him once he is 280 pounds he will get the foot surgery. Pt states he is very satisfied with his diet and doe snot need to to make any changes. Pt states he has been experiencing vertigo lately.    TANITA  BODY COMP RESULTS  11/16/2016 04/02/2017 11/26/2017   BMI (kg/m^2) 50.5 50.3 53.9   Fat Mass (lbs) 152 172.2 186.4   Fat Free Mass (lbs) 190.2 168.2 156.8   Total Body Water (lbs) 151      Surgery date: 05/26/2014 Surgery type: RYGB [facility]: 412 on 02/28/14 (highest weight 470 lbs per patient) Weight today: 343.2 lbs  Weight loss goal: 280 lbs to have bone spur surgery in his feet  24-hr recall: B (AM): coffee with french vanilla creamer and splenda 1 to 2 scrambled eggs, 1 pieces [place] bacon or corned beef hash, 1 piece toast Snk (AM): string cheese L (PM):bacon cheese hamburger or chicken sandwich Snk (PM): string cheese and 3-4 pringles  D (PM): salsbury steak and corn on the cob and green beans or mashed potatoes with coke zero Snk (PM): small bag of popcorn  Fluid intake: metamucil in the morning and before bed, water with flavorings, unsweet tea, gatorade zero, zero soda Estimated total protein intake: 90-105 g  Medications: see list Supplementation: bariatric advantage, zinc, vitamin d, vitamin B12, calcium   Using straws: No Drinking while eating: trying not to Hair loss: yes  Carbonated beverages: No N/V/D/C: none Dumping syndrome: yes after drinking too fast and too soon after eating Having you been chewing well: yes Chewing/swallowing difficulties: no Changes in vision: no Changes to mood/headaches: no Hair  loss/Cahnges to skin/Changes to nails: no Any difficulty focusing or concentrating: no Sweating: no Dizziness/Lightheaded: no Palpitations: no  Carbonated beverages: yes  Recent physical activity: 3 days a week for 45 minutes with [employer] folks  Progress Towards Goal(s):  In progress.  Handout provided: pre op diet   Nutritional Diagnosis:  [place]-3.3 Overweight/obesity related to past poor dietary habits and physical inactivity as evidenced by patient w/ recent RYGB surgery following dietary guidelines for continued weight loss.    Intervention:  Nutrition education/diet reinforcement. Goals: -Add non starchy vegetables to your lunch every day -Stop taking the vitamin b12 Teaching Method Utilized:  Visual Auditory Hands on  Barriers to learning/adherence to lifestyle change: none  Demonstrated degree of understanding via:  Teach Back   Monitoring/Evaluation:  Dietary intake, exercise, and body weight. Follow up in 1 months.

## 2017-12-06 ENCOUNTER — Other Ambulatory Visit: Payer: Self-pay | Admitting: Medical

## 2017-12-13 ENCOUNTER — Ambulatory Visit: Payer: BLUE CROSS/BLUE SHIELD | Admitting: Medical

## 2017-12-13 VITALS — BP 120/90 | HR 72 | Resp 18 | Ht 69.0 in | Wt 348.4 lb

## 2017-12-13 DIAGNOSIS — S20211A Contusion of right front wall of thorax, initial encounter: Secondary | ICD-10-CM | POA: Diagnosis not present

## 2017-12-13 MED ORDER — OXYCODONE-ACETAMINOPHEN 5-325 MG PO TABS: 1.0000 | ORAL_TABLET | Freq: Three times a day (TID) | ORAL | 0 refills | Status: DC | PRN
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
## 2017-12-19 DIAGNOSIS — G4733 Obstructive sleep apnea (adult) (pediatric): Secondary | ICD-10-CM | POA: Diagnosis not present

## 2017-12-28 ENCOUNTER — Other Ambulatory Visit: Payer: Self-pay | Admitting: Medical

## 2017-12-28 NOTE — Telephone Encounter (Signed)
Is this ok to refill?  

## 2018-01-01 DIAGNOSIS — M25561 Pain in right knee: Secondary | ICD-10-CM | POA: Diagnosis not present

## 2018-01-01 DIAGNOSIS — M1711 Unilateral primary osteoarthritis, right knee: Secondary | ICD-10-CM | POA: Diagnosis not present

## 2018-01-01 DIAGNOSIS — M1712 Unilateral primary osteoarthritis, left knee: Secondary | ICD-10-CM | POA: Diagnosis not present

## 2018-01-08 ENCOUNTER — Encounter (HOSPITAL_COMMUNITY): Payer: Self-pay

## 2018-01-08 ENCOUNTER — Ambulatory Visit (HOSPITAL_COMMUNITY)
Admission: EM | Admit: 2018-01-08 | Discharge: 2018-01-08 | Disposition: A | Discharge status: Home / Self Care | Payer: BLUE CROSS/BLUE SHIELD | Attending: Family Medicine | Admitting: Family Medicine

## 2018-01-08 DIAGNOSIS — M545 Low back pain: Secondary | ICD-10-CM

## 2018-01-08 DIAGNOSIS — S39012A Strain of muscle, fascia and tendon of lower back, initial encounter: Secondary | ICD-10-CM | POA: Diagnosis not present

## 2018-01-08 LAB — POCT URINALYSIS DIP (DEVICE)
BILIRUBIN URINE: NEGATIVE
Glucose, UA: NEGATIVE mg/dL
HGB URINE DIPSTICK: NEGATIVE
Ketones, ur: NEGATIVE mg/dL
LEUKOCYTES UA: NEGATIVE
NITRITE: NEGATIVE
Protein, ur: NEGATIVE mg/dL
Specific Gravity, Urine: 1.025 (ref 1.005–1.030)
Urobilinogen, UA: 0.2 mg/dL (ref 0.0–1.0)
pH: 5.5 (ref 5.0–8.0)

## 2018-01-08 MED ORDER — CYCLOBENZAPRINE HCL 10 MG PO TABS: 5.0000 mg | ORAL_TABLET | Freq: Every day | ORAL | 0 refills | Status: DC

## 2018-01-08 MED ORDER — KETOROLAC TROMETHAMINE 60 MG/2ML IM SOLN
60.0000 mg | Freq: Once | INTRAMUSCULAR | Status: AC
  Administered 2018-01-08: 60 mg via INTRAMUSCULAR

## 2018-01-08 MED ORDER — KETOROLAC TROMETHAMINE 60 MG/2ML IM SOLN
INTRAMUSCULAR | Status: AC
  Filled 2018-01-08: qty 2

## 2018-01-08 NOTE — ED Provider Notes (Signed)
[facility]    CSN: [id number] Arrival date & time: 01/08/18  1502     History   Chief Complaint Chief Complaint  Patient presents with  . Back Pain    HPI Justin [name] is a 47 y.o. male.   Patient is a 47 year old male with a past medical history of anxiety, arthritis, gout, hypertension, kidney stones, neuropathy, obesity.  Patient here with left flank pain that has waxed and waned since Saturday.  He reports he took some Tylenol Saturday once the pain started and it dulled the pain.  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  Sunday evening the pain returned and had moved more into his lower back with nausea, chills, diaphoresis, body aches.  He is also had some dysuria.  He has been able to tolerate p.o. and the associated symptoms have improved.  he denies any vomiting or diarrhea.  The pain is non radiating.  He denies any numbness or tingling in lower extremities. He denies any loss of bowel or bladder function. Denies any rashes.   He does report an instance earlier last week where he was helping a church member with a door and had a squat down in an awkward position.   He does have a history of kidney stones a few years back which passed on its own.   ROS per HPI      Past Medical History:  Diagnosis Date  . Anxiety   . Arthritis   . Gout   . Hypertension   . Kidney stones age 89 to [age]   throughout life several times, on allopurinol prophylaxis  . Neuropathy    mainly left leg , but bilat legs s/p rapid weight loss and gastric bypass surgery  . Obese   . OSA on CPAP    uses cpap    Patient Active Problem List   Diagnosis Date Noted  . Allergic conjunctivitis of both eyes 10/01/2017  . Allergic rhinitis due to pollen 10/01/2017  . Otalgia 10/01/2017  . Left inguinal pain 08/03/2017  . Encounter for health maintenance examination in adult 05/03/2017  . History of kidney stones 05/03/2017  . Neuropathy  04/13/2017  . Sore throat 04/13/2017  . History of gastric bypass 04/13/2017  . Rhinitis, allergic 09/27/2016  . Left leg swelling 03/22/2016  . Chondromalacia of left patella 10/04/2015  . Synovial plica of left knee 10/04/2015  . Left thigh pain 08/09/2015  . Rapid heart beat 12/09/2014  . Left knee pain 12/09/2014  . Morbid obesity with BMI of 50.0-59.9, adult (HCC) 02/04/2014  . Chest pain 12/18/2011  . Upper respiratory tract infection 03/28/2011  . Sleep apnea 08/08/2010  . VENTRAL HERNIA 04/06/2010  . Anxiety state 10/19/2008  . Gout 08/03/2008  . HYPERTENSION, BENIGN 08/03/2008    Past Surgical History:  Procedure Laterality Date  . CHOLECYSTECTOMY N/A 03/13/2016   Procedure: LAPAROSCOPIC CHOLECYSTECTOMY WITH INTRAOPERATIVE CHOLANGIOGRAM;  Surgeon: Gaynelle AduEric Wilson, MD;  Location: [facility];  Service: General;  Laterality: N/A;  . GASTRIC ROUX-EN-Y N/A 05/26/2014   Procedure: LAPAROSCOPIC ROUX-EN-Y GASTRIC BYPASS WITH UPPER ENDOSCOPY;  Surgeon: Atilano InaEric M Wilson, MD;  Location: [facility];  Service: General;  Laterality: N/A;  . KNEE ARTHROSCOPY Left 10/04/2015   Procedure: LEFT KNEE ARTHROSCOPY WITH LATERAL RETINACULAR RELEASE MEDIAL PLICA INCISION;  Surgeon: Jodi GeraldsJohn Graves, MD;  Location: [facility];  Service: Orthopedics;  Laterality: Left;  . LEFT HEART CATH AND CORONARY ANGIOGRAPHY N/A 11/02/2017   Procedure:  LEFT HEART CATH AND CORONARY ANGIOGRAPHY;  Surgeon: Yates Decamp, MD;  [facility];  Service: Cardiovascular;  Laterality: N/A;  . LEFT HEART CATHETERIZATION WITH CORONARY ANGIOGRAM N/A 09/17/2013   Procedure: LEFT HEART CATHETERIZATION WITH CORONARY ANGIOGRAM;  Surgeon: Peter M Swaziland, MD;  Location: [facility] C[facility];  Service: Cardiovascular;  Laterality: N/A;  . llithotripsy     . TOOTH EXTRACTION    . UPPER GI ENDOSCOPY  05/26/2014   Procedure: UPPER GI ENDOSCOPY;  Surgeon: Atilano Ina, MD;  Location: [facility];  Service: General;;       Home Medications    Prior to  Admission medications   Medication Sig Start Date End Date Taking? Authorizing Provider  amLODipine (NORVASC) 5 MG tablet Take 5 mg by mouth daily.    [provider]  busPIRone (BUSPAR) 15 MG tablet Take 1 tablet (15 mg total) 2 (two) times daily by mouth. 05/03/17   Tysinger, Kermit Balo, PA-C  carvedilol (COREG) 3.125 MG tablet TAKE 1 TABLET BY MOUTH TWICE DAILY WITH A MEAL 10/16/17   Tysinger, Kermit Balo, PA-C  cholecalciferol (VITAMIN D) 1000 units tablet Take 1,000 Units by mouth daily.    [provider]  cyclobenzaprine (FLEXERIL) 10 MG tablet Take 0.5 tablets (5 mg total) by mouth at bedtime. 01/08/18   Dahlia Byes A, NP  gabapentin (NEURONTIN) 300 MG capsule Take 1 capsule (300 mg total) 3 (three) times daily by mouth. 1 by mouth at night as needed may increase to 3 at night as needed Patient taking differently: Take 300 mg by mouth 3 (three) times daily.  05/03/17   Tysinger, Kermit Balo, PA-C  hydrOXYzine (ATARAX/VISTARIL) 10 MG tablet Twice daily for 4 days then once daily Patient taking differently: Take 10 mg by mouth daily as needed for anxiety.  10/01/17   Tysinger, Kermit Balo, PA-C  loratadine (CLARITIN) 10 MG tablet Take 10 mg by mouth daily.    [provider]  Multiple Vitamin (MULTIVITAMIN WITH MINERALS) TABS Take 2 tablets by mouth every morning.     [provider]  nitroGLYCERIN (NITROSTAT) 0.4 MG SL tablet Place 0.4 mg under the tongue every 5 (five) minutes as needed for chest pain.    [provider]  omeprazole (PRILOSEC) 40 MG capsule TAKE ONE CAPSULE BY MOUTH DAILY 12/28/17   Tysinger, Kermit Balo, PA-C  ondansetron (ZOFRAN) 4 MG tablet Take 1 tablet (4 mg total) by mouth every 8 (eight) hours as needed for nausea or vomiting. 10/01/17   Tysinger, Kermit Balo, PA-C  oxyCODONE-acetaminophen (PERCOCET) 5-325 MG tablet Take 1 tablet by mouth every 8 (eight) hours as needed for severe pain. 12/13/17   Tysinger, Kermit Balo, PA-C  pravastatin (PRAVACHOL) 40 MG  tablet Take 1 tablet (40 mg total) by mouth every evening. 10/25/17 10/25/18  Tysinger, Kermit Balo, PA-C  traMADol (ULTRAM) 50 MG tablet Take 1 tablet (50 mg total) by mouth every 8 (eight) hours as needed. 06/21/17   Hyatt, Max T, DPM  Zinc 50 MG TABS Take 1 tablet by mouth every morning.     [provider]    Family History Family History  Problem Relation Age of Onset  . Coronary artery disease Mother 41       MI  . Diabetes Mother   . Stroke Mother        ~2012-Deceased  . Aneurysm Mother   . Arthritis Mother        DDD  . Heart disease Mother   .  Hypertension Father   . Alcohol abuse Father   . Gout Father   . Depression Father   . Dystonia Maternal Grandmother   . Hypertension Maternal Grandmother   . Heart disease Maternal Grandmother   . Heart disease Maternal Grandfather        died of MI  . COPD Paternal Grandmother   . Alzheimer's disease Paternal Grandmother   . Heart disease Paternal Grandmother        pacemaker  . Heart disease Paternal Grandfather   . Cancer Paternal Grandfather        prostate and colon  . Gallbladder disease Paternal Grandfather     Social History Social History   Tobacco Use  . Smoking status: Never Smoker  . Smokeless tobacco: Never Used  Substance Use Topics  . Alcohol use: No  . Drug use: No     Allergies   Lamictal [lamotrigine]; Tegretol [carbamazepine]; Sulfonamide derivatives; and Influenza vaccines   Review of Systems Review of Systems   Physical Exam Triage Vital Signs ED Triage Vitals  Enc Vitals Group     BP 01/08/18 1514 125/66     Pulse Rate 01/08/18 1514 70     Resp 01/08/18 1514 20     Temp 01/08/18 1514 98.5 F (36.9 C)     Temp Source 01/08/18 1514 Oral     SpO2 01/08/18 1514 97 %     Weight --      Height --      Head Circumference --      Peak Flow --      Pain Score 01/08/18 1513 7     Pain Loc --      Pain Edu? --      Excl. in GC? --    No data found.  Updated Vital Signs BP 125/66  (BP Location: Left Arm)   Pulse 70   Temp 98.5 F (36.9 C) (Oral)   Resp 20   SpO2 97%   Visual Acuity Right Eye Distance:   Left Eye Distance:   Bilateral Distance:    Right Eye Near:   Left Eye Near:    Bilateral Near:     Physical Exam  Constitutional: He is oriented to person, place, and time. He appears well-developed and well-nourished.  Body habitus limits exam  Neck: Normal range of motion.  Pulmonary/Chest: Effort normal.  Abdominal: Soft. Bowel sounds are normal. There is tenderness.  Negative CVA tenderness  Musculoskeletal:  Tender to palpation of left lower lumbar region.   Lymphadenopathy:    He has no cervical adenopathy.  Neurological: He is alert and oriented to person, place, and time.  Skin: Skin is warm and dry.  Psychiatric: He has a normal mood and affect.  Nursing note and vitals reviewed.    UC Treatments / Results  Labs (all labs ordered are listed, but only abnormal results are displayed) Labs Reviewed  POCT URINALYSIS DIP (DEVICE)    EKG None  Radiology No results found.  Procedures Procedures (including critical care time)  Medications Ordered in UC Medications  ketorolac (TORADOL) injection 60 mg (60 mg Intramuscular Given 01/08/18 1548)    Initial Impression / Assessment and Plan / UC Course  I have reviewed the triage vital signs and the nursing notes.  Pertinent labs & imaging results that were available during my care of the patient were reviewed by me and considered in my medical decision making (see chart for details).     Urine negative for  leuks and hgb. Less likely kidney stone or infection based on these results. More likely lumbar strain. Will try conservative treatment with heat, or ice and Flexeril.  He can continue the Tylenol.  No NSAIDs due to history of gastric bypass.  If he is not better or the symptoms get worse he may need to go to the ER for further evaluation.  Final Clinical Impressions(s) / UC  Diagnoses   Final diagnoses:  Strain of lumbar region, initial encounter     Discharge Instructions     It was nice meeting you!!  Your urine was negative for infection and there was no trace of blood.  This is not likely a kidney stone and more likely a muscle strain.  The Toradol in clinic should help with that.  I will send you home with some medication for pain and inflammation. Continue to use heat and ice and if the symptoms do not get better or get worse in the next week, follow up with your PCP. If you develop severe symptom to include; severe pain, vomiting please go to the ER.      ED Prescriptions    Medication Sig Dispense Auth. Provider   cyclobenzaprine (FLEXERIL) 10 MG tablet Take 0.5 tablets (5 mg total) by mouth at bedtime. 20 tablet Dahlia Byes A, NP     Controlled Substance Prescriptions [place] Controlled Substance Registry consulted? Not Applicable   Janace Aris, NP 01/08/18 1623

## 2018-01-08 NOTE — Discharge Instructions (Addendum)
It was nice meeting you!!  Your urine was negative for infection and there was no trace of blood.  This is not likely a kidney stone and more likely a muscle strain.  The Toradol in clinic should help with that.  I will send you home with some medication for pain and inflammation. Continue to use heat and ice and if the symptoms do not get better or get worse in the next week, follow up with your PCP. If you develop severe symptom to include; severe pain, vomiting please go to the ER.

## 2018-01-08 NOTE — ED Triage Notes (Signed)
Pt presents with back pain, believes to be kidney stones

## 2018-01-09 ENCOUNTER — Ambulatory Visit: Payer: BLUE CROSS/BLUE SHIELD | Admitting: Medical

## 2018-01-16 ENCOUNTER — Other Ambulatory Visit: Payer: Self-pay | Admitting: Medical

## 2018-01-28 ENCOUNTER — Other Ambulatory Visit: Payer: Self-pay | Admitting: Medical

## 2018-02-02 ENCOUNTER — Other Ambulatory Visit: Payer: Self-pay | Admitting: Medical

## 2018-02-04 ENCOUNTER — Ambulatory Visit
Admission: RE | Admit: 2018-02-04 | Discharge: 2018-02-04 | Disposition: A | Discharge status: Home / Self Care | Payer: BLUE CROSS/BLUE SHIELD | Source: Ambulatory Visit | Attending: Medical | Admitting: Medical

## 2018-02-04 ENCOUNTER — Ambulatory Visit: Payer: BLUE CROSS/BLUE SHIELD | Admitting: Medical

## 2018-02-04 ENCOUNTER — Telehealth: Payer: Self-pay | Admitting: Medical

## 2018-02-04 VITALS — BP 120/80 | HR 78 | Temp 97.9°F | Resp 16 | Ht 69.0 in | Wt 338.6 lb

## 2018-02-04 DIAGNOSIS — M549 Dorsalgia, unspecified: Secondary | ICD-10-CM | POA: Diagnosis not present

## 2018-02-04 DIAGNOSIS — N281 Cyst of kidney, acquired: Secondary | ICD-10-CM

## 2018-02-04 DIAGNOSIS — N200 Calculus of kidney: Secondary | ICD-10-CM

## 2018-02-04 DIAGNOSIS — M545 Low back pain: Secondary | ICD-10-CM | POA: Diagnosis not present

## 2018-02-04 LAB — POCT URINALYSIS DIP (PROADVANTAGE DEVICE)
Bilirubin, UA: NEGATIVE
Glucose, UA: NEGATIVE mg/dL
Ketones, POC UA: NEGATIVE mg/dL
LEUKOCYTES UA: NEGATIVE
NITRITE UA: NEGATIVE
PH UA: 7 (ref 5.0–8.0)
RBC UA: NEGATIVE
Specific Gravity, Urine: 1.015
Urobilinogen, Ur: NEGATIVE

## 2018-02-04 MED ORDER — HYDROCODONE-ACETAMINOPHEN 7.5-325 MG PO TABS: 1.0000 | ORAL_TABLET | Freq: Four times a day (QID) | ORAL | 0 refills | Status: DC | PRN

## 2018-02-04 MED ORDER — TAMSULOSIN HCL 0.4 MG PO CAPS: 0.4000 mg | ORAL_CAPSULE | Freq: Every day | ORAL | 1 refills | Status: DC

## 2018-02-04 NOTE — Telephone Encounter (Signed)
Refer to Urology for renal stones, renal cysts, preferably within the next week

## 2018-02-04 NOTE — Telephone Encounter (Signed)
Is this ok to refill?  

## 2018-02-04 NOTE — Progress Notes (Signed)
Subjective: Chief Complaint  Patient presents with  . back pain     back pain X 3-4 weeks    Here for back pain x several weeks.  Went to urgent care for same recently.    He was sent home with flexeril for pulled muscle.  However, he notes he was on vacation down at [facility] this past weekend 2-3 days ago, passed a stone in his urine.  Has had ongoing moderate to severe pain, worse at times.   Pain is lower bilat, worse on left.  Pain on left sometimes shoots down left leg.  Lifting objects aggravates the pain.   Denies particular activity or injury 3-4 weeks that seemed to injure the back.  Pain has put him into tears at times.    Using some tylenol OTC.   Uses tramadol for arthritis or feet pain occasionally.  Drinking a lot of water.   Walked 2 miles this morning, so he can do some activity with limited pain but then pain worse after bending or exercise.    He thinks he may have had back xray in his 45s.   Last kidney stone was in college, requiring lithotripsy, mid 82s.   Takes Allopurinol daily for gout prevention.  Mother, grandmother and great grandmother all had DDD lumbar spine.  No personal hx/o back or leg joint surgery.  Past Medical History:  Diagnosis Date  . Anxiety   . Arthritis   . Gout   . Hypertension   . Kidney stones age 74 to 43   throughout life several times, on allopurinol prophylaxis  . Neuropathy    mainly left leg , but bilat legs s/p rapid weight loss and gastric bypass surgery  . Obese   . OSA on CPAP    uses cpap   Current Outpatient Medications on File Prior to Visit  Medication Sig Dispense Refill  . amLODipine (NORVASC) 5 MG tablet Take 5 mg by mouth daily.    . busPIRone (BUSPAR) 15 MG tablet Take 1 tablet (15 mg total) 2 (two) times daily by mouth. 180 tablet 1  . carvedilol (COREG) 3.125 MG tablet TAKE 1 TABLET BY MOUTH TWICE DAILY WITH A MEAL 180 tablet 0  . cholecalciferol (VITAMIN D) 1000 units tablet Take 1,000 Units by mouth daily.     Marland Kitchen gabapentin (NEURONTIN) 300 MG capsule Take 1 capsule (300 mg total) 3 (three) times daily by mouth. 1 by mouth at night as needed may increase to 3 at night as needed (Patient taking differently: Take 300 mg by mouth 3 (three) times daily. ) 270 capsule 3  . hydrOXYzine (ATARAX/VISTARIL) 10 MG tablet Twice daily for 4 days then once daily (Patient taking differently: Take 10 mg by mouth daily as needed for anxiety. ) 30 tablet 1  . loratadine (CLARITIN) 10 MG tablet Take 10 mg by mouth daily.    . Multiple Vitamin (MULTIVITAMIN WITH MINERALS) TABS Take 2 tablets by mouth every morning.     . nitroGLYCERIN (NITROSTAT) 0.4 MG SL tablet Place 0.4 mg under the tongue every 5 (five) minutes as needed for chest pain.    Marland Kitchen omeprazole (PRILOSEC) 40 MG capsule TAKE ONE CAPSULE BY MOUTH DAILY 30 capsule 0  . pravastatin (PRAVACHOL) 40 MG tablet TAKE 1 TABLET BY MOUTH EVERY EVENING 90 tablet 0  . traMADol (ULTRAM) 50 MG tablet Take 1 tablet (50 mg total) by mouth every 8 (eight) hours as needed. 30 tablet 0  . Zinc 50 MG  TABS Take 1 tablet by mouth every morning.     . cyclobenzaprine (FLEXERIL) 10 MG tablet Take 0.5 tablets (5 mg total) by mouth at bedtime. (Patient not taking: Reported on 02/04/2018) 20 tablet 0  . ondansetron (ZOFRAN) 4 MG tablet Take 1 tablet (4 mg total) by mouth every 8 (eight) hours as needed for nausea or vomiting. (Patient not taking: Reported on 02/04/2018) 20 tablet 0  . oxyCODONE-acetaminophen (PERCOCET) 5-325 MG tablet Take 1 tablet by mouth every 8 (eight) hours as needed for severe pain. (Patient not taking: Reported on 02/04/2018) 15 tablet 0   No current facility-administered medications on file prior to visit.    Past Surgical History:  Procedure Laterality Date  . CHOLECYSTECTOMY N/A 03/13/2016   Procedure: LAPAROSCOPIC CHOLECYSTECTOMY WITH INTRAOPERATIVE CHOLANGIOGRAM;  Surgeon: Gaynelle AduEric Wilson, MD;  Location: [facility];  Service: General;  Laterality: N/A;  . GASTRIC ROUX-EN-Y  N/A 05/26/2014   Procedure: LAPAROSCOPIC ROUX-EN-Y GASTRIC BYPASS WITH UPPER ENDOSCOPY;  Surgeon: Atilano InaEric M Wilson, MD;  Location: [facility];  Service: General;  Laterality: N/A;  . KNEE ARTHROSCOPY Left 10/04/2015   Procedure: LEFT KNEE ARTHROSCOPY WITH LATERAL RETINACULAR RELEASE MEDIAL PLICA INCISION;  Surgeon: Jodi GeraldsJohn Graves, MD;  Location: [facility];  Service: Orthopedics;  Laterality: Left;  . LEFT HEART CATH AND CORONARY ANGIOGRAPHY N/A 11/02/2017   Procedure: LEFT HEART CATH AND CORONARY ANGIOGRAPHY;  Surgeon: Yates DecampGanji, Jay, MD;  [facility];  Service: Cardiovascular;  Laterality: N/A;  . LEFT HEART CATHETERIZATION WITH CORONARY ANGIOGRAM N/A 09/17/2013   Procedure: LEFT HEART CATHETERIZATION WITH CORONARY ANGIOGRAM;  Surgeon: Peter M SwazilandJordan, MD;  [facility];  Service: Cardiovascular;  Laterality: N/A;  . llithotripsy     . TOOTH EXTRACTION    . UPPER GI ENDOSCOPY  05/26/2014   Procedure: UPPER GI ENDOSCOPY;  Surgeon: Atilano InaEric M Wilson, MD;  Location: [facility];  Service: General;;    ROS as in subjective    Objective BP 120/80   Pulse 78   Temp 97.9 F (36.6 C) (Oral)   Resp 16   Ht 5\' 9"  (1.753 m)   Wt (!) 338 lb 9.6 oz (153.6 kg)   SpO2 96%   BMI 50.00 kg/m   Gen: wd, wn, nad, obese white male Tender bilat lumbar paraspinal region, some midline low back tenderness, mild pain noted with ext and flexion when seems limited due to pain.  There is asymmetry of creases in T and L spine, Legs with mildly decreased internal and external ROM, no other deformity Pulses 2+ LE, 1+ nonpitting LE edema Legs 4-5 /5 strength bilat, normal sensation and DTRs 1+ Abdomen: nontender, +bs, soft, no mass, no organomegaly      Assessment: Encounter Diagnoses  Name Primary?  . Renal calculi Yes  . Acute back pain, unspecified back location, unspecified back pain laterality   . Renal cyst      Plan: Reviewed his CT abdomen pelvis from March 2019.  Discussed his symptoms and current  concerns. He likely has some moving renal calculi causing his pain.  He reportedly saw some debris in the urine a few days ago.  He had multiple bilateral renal calculi back in March 2019 on CT scan that has not passed his far as he knows until now  We will send for x-ray, we will go ahead and refer to urology for findings on CT scan from March 2019, medication as below (hydrocodone) for worse pain, begin Flomax to help possibly pass a  stone, discussed hydration, rest.  F/u pending labs, xray, referral  Chrissie NoaWilliam was seen today for back pain.  Diagnoses and all orders for this visit:  Renal calculi -     DG Lumbar Spine Complete; Future -     Basic metabolic panel -     Ambulatory referral to Urology  Acute back pain, unspecified back location, unspecified back pain laterality -     POCT Urinalysis DIP (Proadvantage Device) -     DG Lumbar Spine Complete; Future -     Basic metabolic panel -     Ambulatory referral to Urology  Renal cyst -     DG Lumbar Spine Complete; Future -     Basic metabolic panel -     Ambulatory referral to Urology  Other orders -     HYDROcodone-acetaminophen (NORCO) 7.5-325 MG tablet; Take 1 tablet by mouth every 6 (six) hours as needed for up to 5 days for moderate pain. -     tamsulosin (FLOMAX) 0.4 MG CAPS capsule; Take 1 capsule (0.4 mg total) by mouth daily.

## 2018-02-05 LAB — BASIC METABOLIC PANEL
BUN / CREAT RATIO: 18 (ref 9–20)
BUN: 15 mg/dL (ref 6–24)
CO2: 23 mmol/L (ref 20–29)
CREATININE: 0.83 mg/dL (ref 0.76–1.27)
Calcium: 9.5 mg/dL (ref 8.7–10.2)
Chloride: 102 mmol/L (ref 96–106)
GFR calc non Af Amer: 106 mL/min/{1.73_m2} (ref >59)
GFR, EST AFRICAN AMERICAN: 123 mL/min/{1.73_m2} (ref >59)
Glucose: 98 mg/dL (ref 65–99)
Potassium: 4.6 mmol/L (ref 3.5–5.2)
Sodium: 141 mmol/L (ref 134–144)

## 2018-02-05 NOTE — Telephone Encounter (Signed)
Sent referral to [facility]

## 2018-02-08 DIAGNOSIS — N200 Calculus of kidney: Secondary | ICD-10-CM | POA: Diagnosis not present

## 2018-02-09 ENCOUNTER — Other Ambulatory Visit: Payer: Self-pay

## 2018-02-11 ENCOUNTER — Telehealth: Payer: Self-pay | Admitting: Medical

## 2018-02-11 ENCOUNTER — Other Ambulatory Visit: Payer: Self-pay | Admitting: Medical

## 2018-02-11 DIAGNOSIS — M5440 Lumbago with sciatica, unspecified side: Principal | ICD-10-CM

## 2018-02-11 DIAGNOSIS — G8929 Other chronic pain: Secondary | ICD-10-CM

## 2018-02-11 MED ORDER — ALPRAZOLAM 0.5 MG PO TABS: ORAL_TABLET | ORAL | 0 refills | Status: DC

## 2018-02-11 MED ORDER — HYDROCODONE-ACETAMINOPHEN 7.5-325 MG PO TABS: 1.0000 | ORAL_TABLET | Freq: Four times a day (QID) | ORAL | 0 refills | Status: AC | PRN

## 2018-02-11 NOTE — Telephone Encounter (Signed)
Is this ok to refill?  

## 2018-02-11 NOTE — Telephone Encounter (Signed)
Please work to set up MRI lumbar spine

## 2018-02-12 ENCOUNTER — Emergency Department (HOSPITAL_COMMUNITY)
Admission: EM | Admit: 2018-02-12 | Discharge: 2018-02-12 | Disposition: A | Discharge status: Home / Self Care | Payer: BLUE CROSS/BLUE SHIELD | Attending: Emergency Medicine | Admitting: Emergency Medicine

## 2018-02-12 ENCOUNTER — Telehealth: Payer: Self-pay | Admitting: Medical

## 2018-02-12 ENCOUNTER — Other Ambulatory Visit: Payer: Self-pay

## 2018-02-12 ENCOUNTER — Encounter (HOSPITAL_COMMUNITY): Payer: Self-pay | Admitting: Emergency Medicine

## 2018-02-12 ENCOUNTER — Emergency Department (HOSPITAL_COMMUNITY): Payer: BLUE CROSS/BLUE SHIELD

## 2018-02-12 DIAGNOSIS — R1084 Generalized abdominal pain: Secondary | ICD-10-CM | POA: Diagnosis not present

## 2018-02-12 DIAGNOSIS — I10 Essential (primary) hypertension: Secondary | ICD-10-CM | POA: Insufficient documentation

## 2018-02-12 DIAGNOSIS — R110 Nausea: Secondary | ICD-10-CM | POA: Diagnosis not present

## 2018-02-12 DIAGNOSIS — R109 Unspecified abdominal pain: Secondary | ICD-10-CM | POA: Diagnosis not present

## 2018-02-12 DIAGNOSIS — Z9884 Bariatric surgery status: Secondary | ICD-10-CM | POA: Insufficient documentation

## 2018-02-12 DIAGNOSIS — N200 Calculus of kidney: Secondary | ICD-10-CM | POA: Diagnosis not present

## 2018-02-12 DIAGNOSIS — M545 Low back pain, unspecified: Secondary | ICD-10-CM

## 2018-02-12 DIAGNOSIS — Z79899 Other long term (current) drug therapy: Secondary | ICD-10-CM | POA: Insufficient documentation

## 2018-02-12 LAB — CBC WITH DIFFERENTIAL/PLATELET
Abs Immature Granulocytes: 0 10*3/uL (ref 0.0–0.1)
Basophils Absolute: 0.1 10*3/uL (ref 0.0–0.1)
Basophils Relative: 2 %
EOS ABS: 0.2 10*3/uL (ref 0.0–0.7)
EOS PCT: 3 %
HCT: 44.1 % (ref 39.0–52.0)
Hemoglobin: 14.4 g/dL (ref 13.0–17.0)
Immature Granulocytes: 0 %
LYMPHS ABS: 2.9 10*3/uL (ref 0.7–4.0)
Lymphocytes Relative: 37 %
MCH: 31 pg (ref 26.0–34.0)
MCHC: 32.7 g/dL (ref 30.0–36.0)
MCV: 94.8 fL (ref 78.0–100.0)
Monocytes Absolute: 0.6 10*3/uL (ref 0.1–1.0)
Monocytes Relative: 7 %
Neutro Abs: 4 10*3/uL (ref 1.7–7.7)
Neutrophils Relative %: 51 %
Platelets: 276 10*3/uL (ref 150–400)
RBC: 4.65 MIL/uL (ref 4.22–5.81)
RDW: 13.4 % (ref 11.5–15.5)
WBC: 7.9 10*3/uL (ref 4.0–10.5)

## 2018-02-12 LAB — COMPREHENSIVE METABOLIC PANEL
ALBUMIN: 4 g/dL (ref 3.5–5.0)
ALT: 21 U/L (ref 0–44)
ANION GAP: 8 (ref 5–15)
AST: 20 U/L (ref 15–41)
Alkaline Phosphatase: 57 U/L (ref 38–126)
BUN: 15 mg/dL (ref 6–20)
CO2: 27 mmol/L (ref 22–32)
Calcium: 9.1 mg/dL (ref 8.9–10.3)
Chloride: 105 mmol/L (ref 98–111)
Creatinine, Ser: 0.91 mg/dL (ref 0.61–1.24)
Glucose, Bld: 104 mg/dL — ABNORMAL HIGH (ref 70–99)
POTASSIUM: 3.9 mmol/L (ref 3.5–5.1)
Sodium: 140 mmol/L (ref 135–145)
Total Bilirubin: 0.7 mg/dL (ref 0.3–1.2)
Total Protein: 6.4 g/dL — ABNORMAL LOW (ref 6.5–8.1)

## 2018-02-12 LAB — POC OCCULT BLOOD, ED: FECAL OCCULT BLD: POSITIVE — AB

## 2018-02-12 LAB — URINALYSIS, ROUTINE W REFLEX MICROSCOPIC
BILIRUBIN URINE: NEGATIVE
Glucose, UA: NEGATIVE mg/dL
HGB URINE DIPSTICK: NEGATIVE
Ketones, ur: NEGATIVE mg/dL
Leukocytes, UA: NEGATIVE
Nitrite: NEGATIVE
Protein, ur: NEGATIVE mg/dL
SPECIFIC GRAVITY, URINE: 1.014 (ref 1.005–1.030)
pH: 6 (ref 5.0–8.0)

## 2018-02-12 MED ORDER — HYDROMORPHONE HCL 1 MG/ML IJ SOLN
1.0000 mg | Freq: Once | INTRAMUSCULAR | Status: AC
  Administered 2018-02-12: 1 mg via INTRAVENOUS
  Filled 2018-02-12: qty 1

## 2018-02-12 MED ORDER — HYDROMORPHONE HCL 1 MG/ML IJ SOLN
0.5000 mg | Freq: Once | INTRAMUSCULAR | Status: AC
  Administered 2018-02-12: 0.5 mg via INTRAVENOUS
  Filled 2018-02-12: qty 1

## 2018-02-12 MED ORDER — OXYCODONE-ACETAMINOPHEN 5-325 MG PO TABS
1.0000 | ORAL_TABLET | Freq: Once | ORAL | Status: AC
  Administered 2018-02-12: 1 via ORAL
  Filled 2018-02-12: qty 1

## 2018-02-12 MED ORDER — ONDANSETRON HCL 4 MG/2ML IJ SOLN
4.0000 mg | Freq: Once | INTRAMUSCULAR | Status: AC
  Administered 2018-02-12: 4 mg via INTRAVENOUS
  Filled 2018-02-12: qty 2

## 2018-02-12 NOTE — ED Provider Notes (Signed)
[facility] EMERGENCY DEPARTMENT Provider Note   CSN: [id number] Arrival date & time: 02/12/18  8295     History   Chief Complaint Chief Complaint  Patient presents with  . Flank Pain    HPI Justin [name] is a 47 y.o. male.  Patient with history of kidney stones, gastric bypass -- presents to the emergency department with complaint of left middle to lower back pain ongoing over several weeks.  Patient was initially treated for a "muscle pull", without improvement.  He followed up with his primary care provider who ordered plain films which were negative except for stones noted, prompting urologist visit.  Patient had a ultrasound by the urologist who noted no obstructing stones.  Patient did pass 2 small fragments of stones a couple weeks ago.  Otherwise no urinary symptoms.  Current plan is for MRI of the lumbar spine.  Patient states that over the past several nights his pain has been worse.  Last night the pain was so bad "it was the straw that broke the camel's back" and he decided to come to the emergency department.  Patient has been taking hydrocodone 7.5/325mg  1-3x per day over the past week.  This gives temporary relief.  Patient reports an episode of bloody stool this morning.  Blood was bright red.  Patient attributes this to a "burst hemorrhoid".  At this time, no radiation of pain into the left leg, but patient states sometimes he gets "sciatica type pain".  No lower extremity weakness.  Patient denies warning symptoms of back pain including: fecal incontinence, urinary retention or overflow incontinence, night sweats, waking from sleep with back pain, unexplained fevers or weight loss, h/o cancer, IVDU, recent trauma.  No fevers, abdominal pain or vomiting.  Occasional nausea with more significant pain.  Pain is worse after exertional activities and with movements.      Past Medical History:  Diagnosis Date  . Anxiety   . Arthritis   . Gout   .  Hypertension   . Kidney stones age 81 to 19   throughout life several times, on allopurinol prophylaxis  . Neuropathy    mainly left leg , but bilat legs s/p rapid weight loss and gastric bypass surgery  . Obese   . OSA on CPAP    uses cpap    Patient Active Problem List   Diagnosis Date Noted  . Allergic conjunctivitis of both eyes 10/01/2017  . Allergic rhinitis due to pollen 10/01/2017  . Otalgia 10/01/2017  . Left inguinal pain 08/03/2017  . Encounter for health maintenance examination in adult 05/03/2017  . History of kidney stones 05/03/2017  . Neuropathy 04/13/2017  . Sore throat 04/13/2017  . History of gastric bypass 04/13/2017  . Rhinitis, allergic 09/27/2016  . Left leg swelling 03/22/2016  . Chondromalacia of left patella 10/04/2015  . Synovial plica of left knee 10/04/2015  . Left thigh pain 08/09/2015  . Rapid heart beat 12/09/2014  . Left knee pain 12/09/2014  . Morbid obesity with BMI of 50.0-59.9, adult (HCC) 02/04/2014  . Chest pain 12/18/2011  . Upper respiratory tract infection 03/28/2011  . Sleep apnea 08/08/2010  . VENTRAL HERNIA 04/06/2010  . Anxiety state 10/19/2008  . Gout 08/03/2008  . HYPERTENSION, BENIGN 08/03/2008    Past Surgical History:  Procedure Laterality Date  . CHOLECYSTECTOMY N/A 03/13/2016   Procedure: LAPAROSCOPIC CHOLECYSTECTOMY WITH INTRAOPERATIVE CHOLANGIOGRAM;  Surgeon: Gaynelle Adu, MD;  Location: [facility] ORS;  Service: General;  Laterality: N/A;  .  GASTRIC ROUX-EN-Y N/A 05/26/2014   Procedure: LAPAROSCOPIC ROUX-EN-Y GASTRIC BYPASS WITH UPPER ENDOSCOPY;  Surgeon: Atilano Ina, MD;  Location: [facility] ORS;  Service: General;  Laterality: N/A;  . KNEE ARTHROSCOPY Left 10/04/2015   Procedure: LEFT KNEE ARTHROSCOPY WITH LATERAL RETINACULAR RELEASE MEDIAL PLICA INCISION;  Surgeon: Jodi Geralds, MD;  Location: [facility] OR;  Service: Orthopedics;  Laterality: Left;  . LEFT HEART CATH AND CORONARY ANGIOGRAPHY N/A 11/02/2017   Procedure: LEFT HEART CATH  AND CORONARY ANGIOGRAPHY;  Surgeon: Yates Decamp, MD;  Location: [facility] INVASIVE CV LAB;  Service: Cardiovascular;  Laterality: N/A;  . LEFT HEART CATHETERIZATION WITH CORONARY ANGIOGRAM N/A 09/17/2013   Procedure: LEFT HEART CATHETERIZATION WITH CORONARY ANGIOGRAM;  Surgeon: Peter M Swaziland, MD;  Location: [facility] CATH LAB;  Service: Cardiovascular;  Laterality: N/A;  . llithotripsy     . TOOTH EXTRACTION    . UPPER GI ENDOSCOPY  05/26/2014   Procedure: UPPER GI ENDOSCOPY;  Surgeon: Atilano Ina, MD;  Location: [facility] ORS;  Service: General;;        Home Medications    Prior to Admission medications   Medication Sig Start Date End Date Taking? Authorizing Provider  allopurinol (ZYLOPRIM) 300 MG tablet Take 300 mg by mouth daily.    [provider]  ALPRAZolam Prudy Feeler) 0.5 MG tablet 1 tablet 15 minutes before procedure 02/11/18   Tysinger, Kermit Balo, PA-C  amLODipine (NORVASC) 5 MG tablet Take 5 mg by mouth daily.    [provider]  busPIRone (BUSPAR) 15 MG tablet TAKE 1 TABLET BY MOUTH TWICE DAILY 02/04/18   Tysinger, Kermit Balo, PA-C  carvedilol (COREG) 3.125 MG tablet TAKE 1 TABLET BY MOUTH TWICE DAILY WITH A MEAL 01/16/18   Tysinger, Kermit Balo, PA-C  cholecalciferol (VITAMIN D) 1000 units tablet Take 1,000 Units by mouth daily.    [provider]  cyclobenzaprine (FLEXERIL) 10 MG tablet Take 0.5 tablets (5 mg total) by mouth at bedtime. Patient not taking: Reported on 02/04/2018 01/08/18   Dahlia Byes A, NP  gabapentin (NEURONTIN) 300 MG capsule Take 1 capsule (300 mg total) 3 (three) times daily by mouth. 1 by mouth at night as needed may increase to 3 at night as needed Patient taking differently: Take 300 mg by mouth 3 (three) times daily.  05/03/17   Tysinger, Kermit Balo, PA-C  HYDROcodone-acetaminophen (NORCO) 7.5-325 MG tablet Take 1 tablet by mouth every 6 (six) hours as needed for up to 5 days for moderate pain. 02/11/18 02/16/18  Tysinger, Kermit Balo, PA-C  hydrOXYzine (ATARAX/VISTARIL)  10 MG tablet Twice daily for 4 days then once daily Patient taking differently: Take 10 mg by mouth daily as needed for anxiety.  10/01/17   Tysinger, Kermit Balo, PA-C  loratadine (CLARITIN) 10 MG tablet Take 10 mg by mouth daily.    [provider]  Multiple Vitamin (MULTIVITAMIN WITH MINERALS) TABS Take 2 tablets by mouth every morning.     [provider]  nitroGLYCERIN (NITROSTAT) 0.4 MG SL tablet Place 0.4 mg under the tongue every 5 (five) minutes as needed for chest pain.    [provider]  omeprazole (PRILOSEC) 40 MG capsule TAKE ONE CAPSULE BY MOUTH DAILY 01/28/18   Tysinger, Kermit Balo, PA-C  ondansetron (ZOFRAN) 4 MG tablet Take 1 tablet (4 mg total) by mouth every 8 (eight) hours as needed for nausea or vomiting. Patient not taking: Reported on 02/04/2018 10/01/17   Tysinger, Kermit Balo, PA-C  oxyCODONE-acetaminophen (PERCOCET) 5-325 MG tablet Take 1  tablet by mouth every 8 (eight) hours as needed for severe pain. Patient not taking: Reported on 02/04/2018 12/13/17   Jac Canavan, PA-C  pravastatin (PRAVACHOL) 40 MG tablet TAKE 1 TABLET BY MOUTH EVERY EVENING 01/16/18   Tysinger, Kermit Balo, PA-C  tamsulosin (FLOMAX) 0.4 MG CAPS capsule Take 1 capsule (0.4 mg total) by mouth daily. 02/04/18   Tysinger, Kermit Balo, PA-C  traMADol (ULTRAM) 50 MG tablet Take 1 tablet (50 mg total) by mouth every 8 (eight) hours as needed. 06/21/17   Hyatt, Max T, DPM  Zinc 50 MG TABS Take 1 tablet by mouth every morning.     [provider]    Family History Family History  Problem Relation Age of Onset  . Coronary artery disease Mother 35       MI  . Diabetes Mother   . Stroke Mother        ~2012-Deceased  . Aneurysm Mother   . Arthritis Mother        DDD  . Heart disease Mother   . Hypertension Father   . Alcohol abuse Father   . Gout Father   . Depression Father   . Dystonia Maternal Grandmother   . Hypertension Maternal Grandmother   . Heart disease Maternal Grandmother    . Heart disease Maternal Grandfather        died of MI  . COPD Paternal Grandmother   . Alzheimer's disease Paternal Grandmother   . Heart disease Paternal Grandmother        pacemaker  . Heart disease Paternal Grandfather   . Cancer Paternal Grandfather        prostate and colon  . Gallbladder disease Paternal Grandfather     Social History Social History   Tobacco Use  . Smoking status: Never Smoker  . Smokeless tobacco: Never Used  Substance Use Topics  . Alcohol use: No  . Drug use: No     Allergies   Lamictal [lamotrigine]; Tegretol [carbamazepine]; Sulfonamide derivatives; Influenza vaccines; and Nsaids   Review of Systems Review of Systems  Constitutional: Negative for fever and unexpected weight change.  HENT: Negative for rhinorrhea and sore throat.   Eyes: Negative for redness.  Respiratory: Negative for cough, shortness of breath and wheezing.   Cardiovascular: Negative for chest pain.  Gastrointestinal: Positive for blood in stool and nausea. Negative for abdominal pain, constipation, diarrhea and vomiting.       Neg for fecal incontinence  Genitourinary: Positive for flank pain. Negative for difficulty urinating, dysuria and hematuria.       Negative for urinary incontinence or retention  Musculoskeletal: Positive for back pain. Negative for myalgias.  Skin: Negative for rash.  Neurological: Negative for weakness, numbness and headaches.       Negative for saddle paresthesias      Physical Exam Updated Vital Signs BP (!) 115/92 (BP Location: Right Arm)   Pulse 78   Temp 98.7 F (37.1 C) (Oral)   Resp 16   SpO2 97%   Physical Exam  Constitutional: He appears well-developed and well-nourished.  HENT:  Head: Normocephalic and atraumatic.  Eyes: Conjunctivae are normal. Right eye exhibits no discharge. Left eye exhibits no discharge.  Neck: Normal range of motion. Neck supple.  Cardiovascular: Normal rate, regular rhythm and normal heart  sounds.  Pulmonary/Chest: Effort normal and breath sounds normal.  Abdominal: Soft. There is no tenderness.  Genitourinary: Rectum normal. Rectal exam shows no external hemorrhoid, no internal hemorrhoid and no mass.  Genitourinary Comments: Minor skin irritation and breakdown externally on rectal exam. No obvious external hemorrhoids.   Musculoskeletal:       Right hip: Normal.       Left hip: Normal.       Cervical back: He exhibits normal range of motion, no tenderness and no bony tenderness.       Thoracic back: He exhibits tenderness. He exhibits normal range of motion and no bony tenderness.       Lumbar back: He exhibits tenderness. He exhibits normal range of motion and no bony tenderness.       Back:  Neurological: He is alert.  Skin: Skin is warm and dry.  Psychiatric: He has a normal mood and affect.  Nursing note and vitals reviewed.    ED Treatments / Results  Labs (all labs ordered are listed, but only abnormal results are displayed) Labs Reviewed  COMPREHENSIVE METABOLIC PANEL - Abnormal; Notable for the following components:      Result Value   Glucose, Bld 104 (*)    Total Protein 6.4 (*)    All other components within normal limits  POC OCCULT BLOOD, ED - Abnormal; Notable for the following components:   Fecal Occult Bld POSITIVE (*)    All other components within normal limits  URINALYSIS, ROUTINE W REFLEX MICROSCOPIC  CBC WITH DIFFERENTIAL/PLATELET    EKG None  Radiology Ct Renal Stone Study  Result Date: 02/12/2018 CLINICAL DATA:  LEFT lower back and flank pain since beginning of August, passed stone fragments 2 weeks ago, history hypertension and additional nonobstructing renal calculi EXAM: CT ABDOMEN AND PELVIS WITHOUT CONTRAST TECHNIQUE: Multidetector CT imaging of the abdomen and pelvis was performed following the standard protocol without IV contrast. Sagittal and coronal MPR images reconstructed from axial data set. Oral contrast not administered  for this indication. COMPARISON:  08/24/2017 FINDINGS: Lower chest: Lung bases clear Hepatobiliary: Gallbladder surgically absent. Liver normal appearance Pancreas: Normal appearance Spleen: Normal appearance Adrenals/Urinary Tract: Adrenal glands normal appearance. BILATERAL nonobstructing renal calculi. Exophytic cyst upper pole RIGHT kidney 2.3 x 2.2 x 2.0 cm. No additional renal mass, hydronephrosis, or hydroureter. No ureteral calcifications. Bladder unremarkable. Stomach/Bowel: Normal appendix. Mild sigmoid diverticulosis without evidence of diverticulitis. Prior gastric bypass. Stomach and bowel loops otherwise normal appearance for technique. Vascular/Lymphatic: Aorta normal caliber without aneurysm. No adenopathy. Reproductive: Unremarkable Other: Small BILATERAL inguinal hernias containing fat slightly larger on RIGHT. Tiny umbilical hernia containing fat. No free air or free fluid. No acute inflammatory process. Musculoskeletal: Unremarkable IMPRESSION: BILATERAL small nonobstructing renal calculi. Small exophytic cyst upper pole RIGHT kidney 2.3 cm greatest size. Sigmoid diverticulosis without diverticulitis changes. Prior gastric bypass. BILATERAL inguinal and tiny umbilical hernias containing fat. Electronically Signed   By: Ulyses SouthwardMark  Boles M.D.   On: 02/12/2018 12:38    Procedures Procedures (including critical care time)  Medications Ordered in ED Medications  HYDROmorphone (DILAUDID) injection 1 mg (1 mg Intravenous Given 02/12/18 0948)  ondansetron (ZOFRAN) injection 4 mg (4 mg Intravenous Given 02/12/18 0948)  HYDROmorphone (DILAUDID) injection 0.5 mg (0.5 mg Intravenous Given 02/12/18 1133)  oxyCODONE-acetaminophen (PERCOCET/ROXICET) 5-325 MG per tablet 1 tablet (1 tablet Oral Given 02/12/18 1133)     Initial Impression / Assessment and Plan / ED Course  I have reviewed the triage vital signs and the nursing notes.  Pertinent labs & imaging results that were available during my care of  the patient were reviewed by me and considered in my medical decision making (see chart for  details).     Patient seen and examined. Work-up initiated. Medications ordered.   Vital signs reviewed and are as follows: BP (!) 115/92 (BP Location: Right Arm)   Pulse 78   Temp 98.7 F (37.1 C) (Oral)   Resp 16   SpO2 97%   11:42 AM Discussed results with patient and his wife, which are reassuring. Pain was improved to 1-2/10, now returning. Patient and wife are upset that we are not ordering an MRI for him.  I discussed what constitutes emergent indications for MRI in the emergency department in regards to back and spinal cord symptoms.  Fortunately, patient does not have any of these today.  I offered additional pain control and patient accepts.  I discussed these results and plan with Dr. Rush Landmark for another opinion.  Agrees with current plan.  11:53 AM Offered renal protocol CT to patient to definitely rule-out non-obstructing stone. He agrees. Feels better after additional pain medication.   2:01 PM CT shows stable right kidney cyst and fat-containing inguinal hernias.  No obvious cause of the pain.  No musculoskeletal abnormalities.  Patient updated.  He continues to be comfortable.  Will discharge at this time.  He will continue his home medications for pain.  Encourage strongly encouraged follow-up with his primary care doctor for consideration of MRI.  Patient urged to return with worsening symptoms or other concerns. Patient verbalized understanding and agrees with plan.    Final Clinical Impressions(s) / ED Diagnoses   Final diagnoses:  Acute left-sided low back pain without sciatica   Patient with ongoing left-sided lower back pain. He had has had recent eval as described above.  Work-up today is again reassuring.  Patient has no red flag signs and symptoms of back pain which would indicate need for emergent MRI today.  Feel patient can safely have this done as an outpatient  through his primary care doctor, unless symptoms change.  Pain control while in the emergency department.  Patient is ambulatory with a normal neurological exam.  No abdominal pain at all on exam.  Lab work is reassuring.   ED Discharge Orders    None       Renne Crigler, [place] 02/12/18 1418    Tegeler, Canary Brim, MD 02/12/18 [phone]

## 2018-02-12 NOTE — Discharge Instructions (Signed)
Please read and follow all provided instructions.  Your diagnoses today include:  1. Acute left-sided low back pain without sciatica    Tests performed today include:  Vital signs - see below for your results today  Blood counts and electrolytes -no problems  Urine test -no infection  CT scan -shows stable kidney cyst and minor hernias  Medications prescribed:   None  Take any prescribed medications only as directed.  Follow-up instructions: Please follow-up with your primary care provider in the next 1 week for further evaluation of your symptoms.   Return instructions:  SEEK IMMEDIATE MEDICAL ATTENTION IF YOU HAVE:  New numbness, tingling, weakness, or problem with the use of your arms or legs  Severe back pain not relieved with medications  Loss control of your bowels or bladder  Increasing pain in any areas of the body (such as chest or abdominal pain)  Shortness of breath, dizziness, or fainting.   Worsening nausea (feeling sick to your stomach), vomiting, fever, or sweats  Any other emergent concerns regarding your health   Additional Information:  Your vital signs today were: BP 120/75    Pulse 60    Temp 98.7 F (37.1 C) (Oral)    Resp 16    SpO2 96%  If your blood pressure (BP) was elevated above 135/85 this visit, please have this repeated by your doctor within one month. --------------

## 2018-02-12 NOTE — ED Triage Notes (Signed)
Pt reports lower left sided back pain. Pt states he has been having this pain since the beginning of august and has been seen at urgent care as well as PCP. Pt states he passed to "stone fragments" about 2 weeks ago. Pt states he had an ultra sound at the urologist last week and was told he had non obstructing stones that shouldn't be causing this pain.

## 2018-02-12 NOTE — Telephone Encounter (Signed)
Left message on voicemail for patient to call back to make appointment this morning.

## 2018-02-12 NOTE — ED Notes (Signed)
ED Provider at bedside. 

## 2018-02-12 NOTE — ED Notes (Signed)
Patient transported to CT 

## 2018-02-12 NOTE — Telephone Encounter (Signed)
Wife called and states they are still at the ED and they haven't done any tests and she was asking about the MRI.  Per Cyndi, MRI has been ordered and approved, and wife is going to call and schedule.  They did not want to make an appt until after they go for the MRI.

## 2018-02-12 NOTE — Telephone Encounter (Signed)
He sent me email this morning about worse back pain and blood in stool. Was heading to the ED.    Per yesterday, we were in the process of MRI Lumbar spine.   If one of us has an opening this morning, we can try and work him in.  I am booked this morning.   If not, then he can go on to the ED if severe symptoms.

## 2018-02-12 NOTE — ED Notes (Signed)
Pt very tearful and stating that he is never coming [facility] because he needs an MRI and we won't send him for one because no one thinks he is telling the truth about his pain.

## 2018-02-12 NOTE — ED Notes (Signed)
Discharge instructions discussed with Pt. Pt verbalized understanding. Pt stable and ambulatory.    

## 2018-02-13 NOTE — Telephone Encounter (Signed)
What is status on this?

## 2018-02-13 NOTE — Telephone Encounter (Signed)
Patient scheduled 02/15/18

## 2018-02-15 ENCOUNTER — Ambulatory Visit
Admission: RE | Admit: 2018-02-15 | Discharge: 2018-02-15 | Disposition: A | Discharge status: Home / Self Care | Payer: BLUE CROSS/BLUE SHIELD | Source: Ambulatory Visit | Attending: Medical | Admitting: Medical

## 2018-02-15 DIAGNOSIS — G8929 Other chronic pain: Secondary | ICD-10-CM

## 2018-02-15 DIAGNOSIS — M48061 Spinal stenosis, lumbar region without neurogenic claudication: Secondary | ICD-10-CM | POA: Diagnosis not present

## 2018-02-15 DIAGNOSIS — M5440 Lumbago with sciatica, unspecified side: Principal | ICD-10-CM

## 2018-02-19 ENCOUNTER — Other Ambulatory Visit: Payer: Self-pay

## 2018-02-19 ENCOUNTER — Other Ambulatory Visit: Payer: Self-pay | Admitting: Medical

## 2018-02-19 DIAGNOSIS — M5440 Lumbago with sciatica, unspecified side: Principal | ICD-10-CM

## 2018-02-19 DIAGNOSIS — G8929 Other chronic pain: Secondary | ICD-10-CM

## 2018-02-19 MED ORDER — CYCLOBENZAPRINE HCL 10 MG PO TABS: 5.0000 mg | ORAL_TABLET | Freq: Every day | ORAL | 0 refills | Status: DC

## 2018-02-19 MED ORDER — HYDROCODONE-ACETAMINOPHEN 5-325 MG PO TABS: 1.0000 | ORAL_TABLET | ORAL | 0 refills | Status: DC | PRN

## 2018-02-21 ENCOUNTER — Telehealth: Payer: Self-pay | Admitting: Medical

## 2018-02-21 NOTE — Telephone Encounter (Signed)
See his email.  I assumed we already sent his referral and MRI and office notes.  Let him know we sent this please.

## 2018-02-21 NOTE — Telephone Encounter (Signed)
Patient notified

## 2018-02-26 ENCOUNTER — Other Ambulatory Visit: Payer: Self-pay | Admitting: Medical

## 2018-03-01 ENCOUNTER — Other Ambulatory Visit: Payer: Self-pay | Admitting: Medical

## 2018-03-01 ENCOUNTER — Other Ambulatory Visit: Payer: Self-pay

## 2018-03-01 DIAGNOSIS — M545 Low back pain: Secondary | ICD-10-CM | POA: Diagnosis not present

## 2018-03-01 DIAGNOSIS — M4696 Unspecified inflammatory spondylopathy, lumbar region: Secondary | ICD-10-CM | POA: Diagnosis not present

## 2018-03-01 MED ORDER — HYDROCODONE-ACETAMINOPHEN 5-325 MG PO TABS: 1.0000 | ORAL_TABLET | ORAL | 0 refills | Status: DC | PRN

## 2018-03-01 NOTE — Telephone Encounter (Signed)
Is this ok to refill?  

## 2018-03-05 ENCOUNTER — Encounter: Payer: Self-pay | Admitting: Podiatry

## 2018-03-05 ENCOUNTER — Ambulatory Visit: Payer: BLUE CROSS/BLUE SHIELD | Admitting: Podiatry

## 2018-03-05 DIAGNOSIS — M7661 Achilles tendinitis, right leg: Secondary | ICD-10-CM

## 2018-03-05 DIAGNOSIS — M7662 Achilles tendinitis, left leg: Secondary | ICD-10-CM

## 2018-03-05 MED ORDER — HYDROCODONE-ACETAMINOPHEN 5-325 MG PO TABS: 1.0000 | ORAL_TABLET | ORAL | 0 refills | Status: DC | PRN

## 2018-03-05 MED ORDER — METHYLPREDNISOLONE 4 MG PO TBPK: ORAL_TABLET | ORAL | 0 refills | Status: DC

## 2018-03-06 NOTE — Progress Notes (Signed)
Justin [name] presents today with a chief complaint of pain to the Achilles bilaterally.  He states that he continues to lose weight regularly.  Objective: Vital signs are stable he is alert and oriented x3 he has mild tenderness on palpation of the Achilles tendon at its insertion site.  The Achilles still feels normal does not feel that there has been any attenuation there is fluctuance overlying the tendon posteriorly most likely bursitis.  Assessment: Insertional Achilles tendinitis bilateral.  Plan: I injected 2 mg of dexamethasone and local anesthetic after sterile Betadine skin prep bilaterally he tolerated procedure well.  Provided him with a prescription for a Medrol Dosepak.  [name] as needed.

## 2018-03-07 ENCOUNTER — Encounter: Payer: Self-pay | Admitting: Skilled Nursing Facility1

## 2018-03-07 ENCOUNTER — Encounter: Payer: BLUE CROSS/BLUE SHIELD | Attending: General Surgery | Admitting: Skilled Nursing Facility1

## 2018-03-07 DIAGNOSIS — Z9884 Bariatric surgery status: Secondary | ICD-10-CM | POA: Diagnosis not present

## 2018-03-07 DIAGNOSIS — Z713 Dietary counseling and surveillance: Secondary | ICD-10-CM | POA: Diagnosis not present

## 2018-03-07 DIAGNOSIS — E669 Obesity, unspecified: Secondary | ICD-10-CM

## 2018-03-07 NOTE — Patient Instructions (Addendum)
-  Aim to have vegetables at least 1 time a day 7 days a week: choose from the non-starchy vegetable list   -Do not have fried foods throughout the week instead have a non-starchy vegetable   -Take your multivitamin 2 times a day  -Stop taking the iron  -Take calcium at least once a day  -Stop taking vitamin B12  -Stop taking the zinc supplement

## 2018-03-07 NOTE — Progress Notes (Signed)
Post-Operative RYGB Surgery  Medical Nutrition Therapy:  Appt start time: 1140 end time: 1200  Primary concerns today: Post-operative Bariatric Surgery Nutrition Management.  Pt states he has started a muscle relaxer and states he has a slipped disc. Pt states he no longer drinks sweet tea. Pt states he shares his meals with his wife when they go out. Pt states with fish he feels his knees swell due to gout. Pt states he has a bowel movement every day.    TANITA  BODY COMP RESULTS  04/02/2017 11/26/2017 03/07/2018   BMI (kg/m^2) 50.3 53.9 51.2   Fat Mass (lbs) 172.2 186.4 192   Fat Free Mass (lbs) 168.2 156.8 145   Total Body Water (lbs)       Surgery date: 05/26/2014 Surgery type: RYGB [facility] weight at [facility]: 412 on 02/28/14 (highest weight 470 lbs per patient) Weight today: 337  Weight loss goal: 280 lbs to have bone spur surgery in his feet  24-hr recall: 3 times a week greens beans or salad  B (AM): protein shake and coffee with cream and splenda Snk (AM): string cheese or 15 pringles  L (PM):tuna sandwich or small bacon cheeseburger with 2 onion rings  Snk (PM): string cheese and 3-4 pringles  D (PM): slice of pizza or corn dog  Snk (PM): small bag of popcorn  Fluid intake: metamucil in the morning and before bed, water with flavorings, unsweet tea, gatorade zero, zero soda, 64 ounces of water, 1 glass of milk a ZOX:WRUEday:once every other day yogurt, 1-2 times a day eating cheese  Estimated total protein intake: 90-105 g  Medications: see list Supplementation: bariatric advantage chewable, iron zinc, vitamin d, vitamin B12, calcium   Using straws: No Drinking while eating: trying not to Hair loss: yes  Carbonated beverages: No N/V/D/C: none Dumping syndrome: yes after drinking too fast and too soon after eating Having you been chewing well: yes Chewing/swallowing difficulties: no Changes in vision: no Changes to mood/headaches: no Hair loss/Cahnges to skin/Changes to nails:  no Any difficulty focusing or concentrating: no Sweating: no Dizziness/Lightheaded: no Palpitations: no  Carbonated beverages: yes  Recent physical activity: 5000-12000 steps: mon wed and fridays walking  Progress Towards Goal(s):  In progress.  Handout provided: pre op diet   Nutritional Diagnosis:  [place]-3.3 Overweight/obesity related to past poor dietary habits and physical inactivity as evidenced by patient w/ recent RYGB surgery following dietary guidelines for continued weight loss.    Intervention:  Nutrition education/diet reinforcement. Goals: -Aim to have vegetables at least 1 time a day 7 days a week: choose from the non-starchy vegetable list  -Do not have fried foods throughout the week instead have a non-starchy vegetable  -Take your multivitamin 2 times a day -Stop taking the iron -Take calcium at least once a day -Stop taking vitamin B12 -Stop taking the zinc supplement  Teaching Method Utilized:  Visual Auditory Hands on  Barriers to learning/adherence to lifestyle change: none  Demonstrated degree of understanding via:  Teach Back   Monitoring/Evaluation:  Dietary intake, exercise, and body weight. Follow up in 1 months.

## 2018-03-11 DIAGNOSIS — M47816 Spondylosis without myelopathy or radiculopathy, lumbar region: Secondary | ICD-10-CM | POA: Diagnosis not present

## 2018-03-20 ENCOUNTER — Encounter: Payer: Self-pay | Admitting: Medical

## 2018-03-20 ENCOUNTER — Ambulatory Visit: Payer: BLUE CROSS/BLUE SHIELD | Admitting: Medical

## 2018-03-20 VITALS — BP 130/90 | HR 83 | Temp 98.0°F | Resp 16 | Ht 69.0 in | Wt 333.4 lb

## 2018-03-20 DIAGNOSIS — R05 Cough: Secondary | ICD-10-CM | POA: Diagnosis not present

## 2018-03-20 DIAGNOSIS — R509 Fever, unspecified: Secondary | ICD-10-CM | POA: Diagnosis not present

## 2018-03-20 DIAGNOSIS — R52 Pain, unspecified: Secondary | ICD-10-CM

## 2018-03-20 DIAGNOSIS — R059 Cough, unspecified: Secondary | ICD-10-CM

## 2018-03-20 LAB — POCT INFLUENZA A/B
Influenza A, POC: NEGATIVE
Influenza B, POC: NEGATIVE

## 2018-03-20 NOTE — Progress Notes (Signed)
Subjective: Chief Complaint  Patient presents with  . sore thraot    sore throat, fever, loss of voice, cough yellow greenish mucas X 1 day   Here for a day or so history of chills, fatigued, fever, sore throat, cough, yellow green mucous. Using tylenol for fever.  No sick contacts.    No nausea or vomiting.  No wheezing or SOB.    No headache.  Came on abruptly.  Using lozenges, tylenol.  No other aggravating or relieving factors. No other complaint.  Past Medical History:  Diagnosis Date  . Anxiety   . Arthritis   . Gout   . Hypertension   . Kidney stones age 76 to 89   throughout life several times, on allopurinol prophylaxis  . Neuropathy    mainly left leg , but bilat legs s/p rapid weight loss and gastric bypass surgery  . Obese   . OSA on CPAP    uses cpap   Current Outpatient Medications on File Prior to Visit  Medication Sig Dispense Refill  . allopurinol (ZYLOPRIM) 300 MG tablet Take 300 mg by mouth daily.    Marland Kitchen amLODipine (NORVASC) 5 MG tablet Take 5 mg by mouth daily.    . busPIRone (BUSPAR) 15 MG tablet TAKE 1 TABLET BY MOUTH TWICE DAILY 180 tablet 0  . carvedilol (COREG) 3.125 MG tablet TAKE 1 TABLET BY MOUTH TWICE DAILY WITH A MEAL (Patient taking differently: Take 3.125 mg by mouth 2 (two) times daily with a meal. ) 180 tablet 0  . cholecalciferol (VITAMIN D) 1000 units tablet Take 1,000 Units by mouth daily.    . cyclobenzaprine (FLEXERIL) 5 MG tablet TK 1 TO 2 TS PO QHS AND ATN PRF MSP OR TIGHTNESS  1  . gabapentin (NEURONTIN) 300 MG capsule Take 1 capsule (300 mg total) 3 (three) times daily by mouth. 1 by mouth at night as needed may increase to 3 at night as needed (Patient taking differently: Take 300 mg by mouth 3 (three) times daily. ) 270 capsule 3  . loratadine (CLARITIN) 10 MG tablet Take 10 mg by mouth daily.    . methocarbamol (ROBAXIN) 500 MG tablet   1  . Multiple Vitamin (MULTIVITAMIN WITH MINERALS) TABS Take 2 tablets by mouth every morning.     .  nitroGLYCERIN (NITROSTAT) 0.4 MG SL tablet Place 0.4 mg under the tongue every 5 (five) minutes as needed for chest pain.    Marland Kitchen omeprazole (PRILOSEC) 40 MG capsule TAKE ONE CAPSULE BY MOUTH DAILY 30 capsule 0  . ondansetron (ZOFRAN) 4 MG tablet Take 1 tablet (4 mg total) by mouth every 8 (eight) hours as needed for nausea or vomiting. 20 tablet 0  . pravastatin (PRAVACHOL) 40 MG tablet TAKE 1 TABLET BY MOUTH EVERY EVENING 90 tablet 0  . traMADol (ULTRAM) 50 MG tablet Take 1 tablet (50 mg total) by mouth every 8 (eight) hours as needed. 30 tablet 0  . Zinc 50 MG TABS Take 1 tablet by mouth every morning.     . methylPREDNISolone (MEDROL DOSEPAK) 4 MG TBPK tablet 6 day dose pack - take as directed (Patient not taking: Reported on 03/20/2018) 21 tablet 0  . tamsulosin (FLOMAX) 0.4 MG CAPS capsule Take 1 capsule (0.4 mg total) by mouth daily. (Patient not taking: Reported on 03/20/2018) 30 capsule 1   No current facility-administered medications on file prior to visit.    ROS as in subjective   Objective: BP 130/90   Pulse 83  Temp 98 F (36.7 C) (Oral)   Resp 16   Ht 5\' 9"  (1.753 m)   Wt (!) 333 lb 6.4 oz (151.2 kg)   SpO2 95%   BMI 49.23 kg/m   Wt Readings from Last 3 Encounters:  03/20/18 (!) 333 lb 6.4 oz (151.2 kg)  03/07/18 (!) 337 lb (152.9 kg)  02/04/18 (!) 338 lb 9.6 oz (153.6 kg)   General appearance: alert, no distress, WD/WN,  HEENT: normocephalic, sclerae anicteric, TMs pearly, nares with mild turbinate edema, mucoid discharge + erythema, pharynx normal Oral cavity: MMM, no lesions Neck: supple, no lymphadenopathy, no thyromegaly, no masses Heart: RRR, normal S1, S2, no murmurs Lungs: CTA bilaterally, no wheezes, rhonchi, or rales Pulses: 2+ symmetric, upper and lower extremities, normal cap refill     Assessment: Encounter Diagnoses  Name Primary?  . Fever, unspecified fever cause Yes  . Cough   . Body aches      Plan: Discussed symptoms and concerns.     Specific home care recommendations today include:  Begin Mucienx DM  Sore throat remedies:  You may use salt water gargles, warm fluids such as coffee or hot tea, or honey/tea/lemon mixture to sooth sore throat pain.  You may use OTC sore throat remedies such as Cepacol lozenges or Chloraseptic spray for sore throat pain.  Runny nose and sneezing remedies: You may use OTC antihistamine such as Zyrtec or Benadryl, but caution as these can cause drowsiness.    Pain/fever relief: You may use over-the-counter Tylenol for pain or fever  Drink extra fluids. Fluids help thin the mucus so your sinuses can drain more easily.   Applying either moist heat or ice packs to the sinus areas may help relieve discomfort.  Use saline nasal sprays to help moisten your sinuses. The sprays can be found at your local drugstore.   Patient was advised to call or return if worse or not improving in the next few days.    Patient voiced understanding of diagnosis, recommendations, and treatment plan.  Justin [name] was seen today for sore thraot.  Diagnoses and all orders for this visit:  Fever, unspecified fever cause  Cough  Body aches

## 2018-04-01 ENCOUNTER — Other Ambulatory Visit: Payer: Self-pay | Admitting: Medical

## 2018-04-04 DIAGNOSIS — M47816 Spondylosis without myelopathy or radiculopathy, lumbar region: Secondary | ICD-10-CM | POA: Diagnosis not present

## 2018-04-12 DIAGNOSIS — Z6841 Body Mass Index (BMI) 40.0 and over, adult: Secondary | ICD-10-CM | POA: Diagnosis not present

## 2018-04-12 DIAGNOSIS — M47816 Spondylosis without myelopathy or radiculopathy, lumbar region: Secondary | ICD-10-CM | POA: Diagnosis not present

## 2018-04-15 ENCOUNTER — Other Ambulatory Visit: Payer: Self-pay | Admitting: Medical

## 2018-04-16 DIAGNOSIS — Z6841 Body Mass Index (BMI) 40.0 and over, adult: Secondary | ICD-10-CM | POA: Diagnosis not present

## 2018-04-16 DIAGNOSIS — M1712 Unilateral primary osteoarthritis, left knee: Secondary | ICD-10-CM | POA: Diagnosis not present

## 2018-04-16 DIAGNOSIS — M25562 Pain in left knee: Secondary | ICD-10-CM | POA: Diagnosis not present

## 2018-04-18 ENCOUNTER — Encounter: Payer: Self-pay | Admitting: Podiatry

## 2018-04-18 ENCOUNTER — Ambulatory Visit: Payer: BLUE CROSS/BLUE SHIELD | Admitting: Podiatry

## 2018-04-18 ENCOUNTER — Ambulatory Visit (INDEPENDENT_AMBULATORY_CARE_PROVIDER_SITE_OTHER): Payer: BLUE CROSS/BLUE SHIELD

## 2018-04-18 DIAGNOSIS — M7661 Achilles tendinitis, right leg: Secondary | ICD-10-CM | POA: Diagnosis not present

## 2018-04-18 DIAGNOSIS — M7751 Other enthesopathy of right foot: Secondary | ICD-10-CM | POA: Diagnosis not present

## 2018-04-18 MED ORDER — HYDROCODONE-ACETAMINOPHEN 5-325 MG PO TABS: 1.0000 | ORAL_TABLET | Freq: Four times a day (QID) | ORAL | 0 refills | Status: DC | PRN

## 2018-04-18 MED ORDER — METHYLPREDNISOLONE 4 MG PO TBPK: ORAL_TABLET | ORAL | 0 refills | Status: DC

## 2018-04-18 NOTE — Progress Notes (Signed)
He presents today complaining of pain to the posterior aspect of his right heel as well as the fifth metatarsal phalangeal joint area of the right foot.  He denies any trauma that is really been killing him over the past few days and he would like to have some type of medicine if at all possible to help calm this down.  He did also request an injection.  Objective: Vital signs are stable he is alert and oriented x3.  Pulses are palpable.  He has pain on palpation of the insertion of the Achilles tendon on the right foot mild erythema no edema cellulitis drainage or odor.  Has pain on palpation of the fifth metatarsal the right foot radiographs demonstrate negative signs.  Assessment: Capsulitis fifth metatarsal phalangeal joint right insertional Achilles tendinitis right.  Plan: At this point after sterile Betadine skin prep I injected 20 mg Kenalog 5 mg Marcaine fifth metatarsal phalangeal joint right foot 2 mg of dexamethasone and local anesthetic to the posterior aspect of the Achilles subcutaneously making sure not to inject into the Achilles itself.  Also placed him on a Medrol Dosepak and a prescription for Norco for 5 days.

## 2018-05-03 ENCOUNTER — Other Ambulatory Visit: Payer: Self-pay | Admitting: Family Medicine

## 2018-05-03 ENCOUNTER — Other Ambulatory Visit: Payer: Self-pay | Admitting: Medical

## 2018-05-03 NOTE — Telephone Encounter (Signed)
Is this ok to refill?  

## 2018-05-13 ENCOUNTER — Other Ambulatory Visit: Payer: Self-pay | Admitting: Medical

## 2018-05-13 NOTE — Telephone Encounter (Signed)
Is this ok to refill?  

## 2018-05-23 DIAGNOSIS — G4733 Obstructive sleep apnea (adult) (pediatric): Secondary | ICD-10-CM | POA: Diagnosis not present

## 2018-05-28 ENCOUNTER — Telehealth: Payer: Self-pay

## 2018-05-28 NOTE — Telephone Encounter (Signed)
[facility] sent a fax for refills on Allopurinol and Omeprazole. Please advise.

## 2018-05-29 ENCOUNTER — Other Ambulatory Visit: Payer: Self-pay

## 2018-05-29 MED ORDER — OMEPRAZOLE 40 MG PO CPDR: 40.0000 mg | DELAYED_RELEASE_CAPSULE | Freq: Every day | ORAL | 1 refills | Status: DC

## 2018-05-29 MED ORDER — ALLOPURINOL 300 MG PO TABS: 300.0000 mg | ORAL_TABLET | Freq: Every day | ORAL | 1 refills | Status: AC

## 2018-05-29 NOTE — Telephone Encounter (Signed)
Yes please send 90 day refills with 1 refills

## 2018-05-29 NOTE — Telephone Encounter (Signed)
Is this ok to refill?  

## 2018-05-29 NOTE — Telephone Encounter (Signed)
Done rx sent to [facility].

## 2018-05-30 DIAGNOSIS — M47816 Spondylosis without myelopathy or radiculopathy, lumbar region: Secondary | ICD-10-CM | POA: Diagnosis not present

## 2018-06-06 ENCOUNTER — Ambulatory Visit: Payer: BLUE CROSS/BLUE SHIELD | Admitting: Skilled Nursing Facility1

## 2018-07-04 DIAGNOSIS — M7062 Trochanteric bursitis, left hip: Secondary | ICD-10-CM | POA: Diagnosis not present

## 2018-07-04 DIAGNOSIS — M1712 Unilateral primary osteoarthritis, left knee: Secondary | ICD-10-CM | POA: Diagnosis not present

## 2018-07-09 ENCOUNTER — Ambulatory Visit: Payer: BLUE CROSS/BLUE SHIELD | Admitting: Podiatry

## 2018-07-09 ENCOUNTER — Encounter: Payer: Self-pay | Admitting: Podiatry

## 2018-07-09 DIAGNOSIS — M7661 Achilles tendinitis, right leg: Secondary | ICD-10-CM

## 2018-07-09 DIAGNOSIS — M7662 Achilles tendinitis, left leg: Secondary | ICD-10-CM

## 2018-07-09 NOTE — Progress Notes (Signed)
He presents today complaining of a bilateral follow-up of his insertional Achilles tendinitis.  Objective: Vital signs are stable he is alert oriented x3 pulses are palpable.  He is very distal and medial pain at the insertion site of the Achilles.  Assessment: Chronic intractable Achilles tendinitis.  Plan: Small injection subcutaneous dexamethasone and local anesthetic point of maximal tenderness.  We will follow-up with [place] on an as-needed basis.

## 2018-07-11 ENCOUNTER — Encounter: Payer: Self-pay | Admitting: Skilled Nursing Facility1

## 2018-07-11 ENCOUNTER — Encounter: Payer: BLUE CROSS/BLUE SHIELD | Attending: General Surgery | Admitting: Skilled Nursing Facility1

## 2018-07-11 DIAGNOSIS — E669 Obesity, unspecified: Secondary | ICD-10-CM | POA: Diagnosis not present

## 2018-07-11 NOTE — Progress Notes (Signed)
Post-Operative RYGB Surgery  Medical Nutrition Therapy:  Appt start time: 1140 end time: 1200  Primary concerns today: Post-operative Bariatric Surgery Nutrition Management.   Pt states due to his insurance shift he will have to transfer care to [facility]. Pt states he has been thinking of getting a revision surgery due to people in his church and family members telling him he has not lost any weight (pts original weight was 500 pounds). Pt states he is very happy with his successful. Pt states he has continued to share plates with his wife when they go out.   TANITA  BODY COMP RESULTS  04/02/2017 11/26/2017 03/07/2018 07/11/2018   BMI (kg/m^2) 50.3 53.9 51.2 50   Fat Mass (lbs) 172.2 186.4 192 195.8   Fat Free Mass (lbs) 168.2 156.8 145 142.8   Total Body Water (lbs)       Surgery date: 05/26/2014 Surgery type: RYGB Start weight at [facility]: 412 on 02/28/14 (highest weight 470 lbs per patient) Weight today: 338.6  Weight loss goal: 280 lbs to have bone spur surgery in his feet  24-hr recall: eating out 3 times a week B (AM): oatmeal and coffee Snk (AM): half protein shake and 2-3 chips   L (PM):tuna sandwich or small bacon cheeseburger with 2 onion rings  Snk (PM): string cheese and 3-4 pringles  D (PM): boneless chicken wings and fat free ranch with deli ham with cheese or 4 ounces meatloaf and pintos with green beans  Snk (PM): 1-2 Hershey kisses and other half protein shake  Fluid intake: metamucil in the morning and before bed, water with flavorings, half sweet/unsweet tea, gatorade zero, zero soda, 64 ounces of water, 1 glass of milk a FYB:OFBP every other day yogurt, 1-2 times a day eating cheese  Estimated total protein intake: 90-105 g  Medications: see list Supplementation: bariatric advantage chewable, iron zinc, vitamin d, vitamin B12, calcium   Using straws: No Drinking while eating: trying not to Hair loss: no Carbonated beverages: No N/V/D/C: none Dumping  syndrome: yes after drinking too fast and too soon after eating Having you been chewing well: yes Chewing/swallowing difficulties: no Changes in vision: no Changes to mood/headaches: no Hair loss/Changes to skin/Changes to nails: no Any difficulty focusing or concentrating: no Sweating: no Dizziness/Lightheaded: no Palpitations: no  Carbonated beverages: yes  Recent physical activity: 10000 steps: mon wed and fridays walking  Progress Towards Goal(s):  In progress.  Handout provided: pre op diet   Nutritional Diagnosis:  [place]-3.3 Overweight/obesity related to past poor dietary habits and physical inactivity as evidenced by patient w/ recent RYGB surgery following dietary guidelines for continued weight loss.    Intervention:  Nutrition education/diet reinforcement. Goals: -Aim to have vegetables at least 2 time a day 7 days a week: choose from the non-starchy vegetable list  -Aim to have 2 non starchy vegetables a day -Aim to have fried food no more than 0-1 times a week On days you are out all day bring water and food with you; for example  -Peanut butter sandwich on whole wheat and cucumber slices  -P3 pack  -ham and cheese sandwich on whole wheat bread and 1/4 cup nuts  -Pack of tuna with carrots  Try carrot sticks dip in fat free ranch  Teaching Method Utilized:  Visual Auditory Hands on  Barriers to learning/adherence to lifestyle change: none  Demonstrated degree of understanding via:  Teach Back   Monitoring/Evaluation:  Dietary intake, exercise, and body weight.

## 2018-07-11 NOTE — Patient Instructions (Addendum)
-  Aim to have 2 non starchy vegetables a day  -Aim to have fried food no more than 0-1 times a week  On days you are out all day bring water and food with you; for example  -Peanut butter sandwich on whole wheat and cucumber slices   -P3 pack    -ham and cheese sandwich on whole wheat bread and 1/4 cup nuts   -Pack of tuna with carrots    Try carrot sticks dip in fat free ranch

## 2018-07-22 DIAGNOSIS — M545 Low back pain: Secondary | ICD-10-CM | POA: Diagnosis not present

## 2018-07-22 DIAGNOSIS — I10 Essential (primary) hypertension: Secondary | ICD-10-CM | POA: Diagnosis not present

## 2018-07-22 DIAGNOSIS — M25552 Pain in left hip: Secondary | ICD-10-CM | POA: Diagnosis not present

## 2018-07-22 DIAGNOSIS — M25562 Pain in left knee: Secondary | ICD-10-CM | POA: Diagnosis not present

## 2018-07-31 DIAGNOSIS — M48061 Spinal stenosis, lumbar region without neurogenic claudication: Secondary | ICD-10-CM | POA: Insufficient documentation

## 2018-07-31 DIAGNOSIS — M7062 Trochanteric bursitis, left hip: Secondary | ICD-10-CM | POA: Insufficient documentation

## 2018-07-31 DIAGNOSIS — M25552 Pain in left hip: Secondary | ICD-10-CM | POA: Diagnosis not present

## 2018-07-31 DIAGNOSIS — M1712 Unilateral primary osteoarthritis, left knee: Secondary | ICD-10-CM | POA: Diagnosis not present

## 2018-07-31 DIAGNOSIS — M4854XA Collapsed vertebra, not elsewhere classified, thoracic region, initial encounter for fracture: Secondary | ICD-10-CM | POA: Diagnosis not present

## 2018-07-31 DIAGNOSIS — M25562 Pain in left knee: Secondary | ICD-10-CM | POA: Diagnosis not present

## 2018-07-31 DIAGNOSIS — M47816 Spondylosis without myelopathy or radiculopathy, lumbar region: Secondary | ICD-10-CM | POA: Diagnosis not present

## 2018-08-07 DIAGNOSIS — S22080D Wedge compression fracture of T11-T12 vertebra, subsequent encounter for fracture with routine healing: Secondary | ICD-10-CM | POA: Diagnosis not present

## 2018-08-07 DIAGNOSIS — G8929 Other chronic pain: Secondary | ICD-10-CM | POA: Diagnosis not present

## 2018-08-07 DIAGNOSIS — M5442 Lumbago with sciatica, left side: Secondary | ICD-10-CM | POA: Diagnosis not present

## 2018-08-12 DIAGNOSIS — I10 Essential (primary) hypertension: Secondary | ICD-10-CM | POA: Diagnosis not present

## 2018-08-12 DIAGNOSIS — M7661 Achilles tendinitis, right leg: Secondary | ICD-10-CM | POA: Diagnosis not present

## 2018-08-12 DIAGNOSIS — E782 Mixed hyperlipidemia: Secondary | ICD-10-CM | POA: Diagnosis not present

## 2018-08-12 DIAGNOSIS — M7730 Calcaneal spur, unspecified foot: Secondary | ICD-10-CM | POA: Diagnosis not present

## 2018-08-12 DIAGNOSIS — Z9884 Bariatric surgery status: Secondary | ICD-10-CM | POA: Diagnosis not present

## 2018-08-27 DIAGNOSIS — M5442 Lumbago with sciatica, left side: Secondary | ICD-10-CM | POA: Diagnosis not present

## 2018-08-27 DIAGNOSIS — M4804 Spinal stenosis, thoracic region: Secondary | ICD-10-CM | POA: Diagnosis not present

## 2018-08-27 DIAGNOSIS — X58XXXD Exposure to other specified factors, subsequent encounter: Secondary | ICD-10-CM | POA: Diagnosis not present

## 2018-08-27 DIAGNOSIS — Z9884 Bariatric surgery status: Secondary | ICD-10-CM | POA: Diagnosis not present

## 2018-08-27 DIAGNOSIS — M4805 Spinal stenosis, thoracolumbar region: Secondary | ICD-10-CM | POA: Diagnosis not present

## 2018-08-27 DIAGNOSIS — M5127 Other intervertebral disc displacement, lumbosacral region: Secondary | ICD-10-CM | POA: Diagnosis not present

## 2018-08-27 DIAGNOSIS — Z6841 Body Mass Index (BMI) 40.0 and over, adult: Secondary | ICD-10-CM | POA: Diagnosis not present

## 2018-08-27 DIAGNOSIS — M48061 Spinal stenosis, lumbar region without neurogenic claudication: Secondary | ICD-10-CM | POA: Diagnosis not present

## 2018-08-27 DIAGNOSIS — Z87442 Personal history of urinary calculi: Secondary | ICD-10-CM | POA: Diagnosis not present

## 2018-08-27 DIAGNOSIS — M5126 Other intervertebral disc displacement, lumbar region: Secondary | ICD-10-CM | POA: Diagnosis not present

## 2018-08-27 DIAGNOSIS — M5124 Other intervertebral disc displacement, thoracic region: Secondary | ICD-10-CM | POA: Diagnosis not present

## 2018-08-27 DIAGNOSIS — S22080D Wedge compression fracture of T11-T12 vertebra, subsequent encounter for fracture with routine healing: Secondary | ICD-10-CM | POA: Diagnosis not present

## 2018-08-30 DIAGNOSIS — M546 Pain in thoracic spine: Secondary | ICD-10-CM | POA: Diagnosis not present

## 2018-08-30 DIAGNOSIS — M1712 Unilateral primary osteoarthritis, left knee: Secondary | ICD-10-CM | POA: Diagnosis not present

## 2018-08-30 DIAGNOSIS — M47816 Spondylosis without myelopathy or radiculopathy, lumbar region: Secondary | ICD-10-CM | POA: Diagnosis not present

## 2018-08-30 DIAGNOSIS — M48061 Spinal stenosis, lumbar region without neurogenic claudication: Secondary | ICD-10-CM | POA: Diagnosis not present

## 2018-08-30 DIAGNOSIS — M4804 Spinal stenosis, thoracic region: Secondary | ICD-10-CM | POA: Diagnosis not present

## 2018-09-01 DIAGNOSIS — M4804 Spinal stenosis, thoracic region: Secondary | ICD-10-CM | POA: Insufficient documentation

## 2018-09-01 DIAGNOSIS — G8929 Other chronic pain: Secondary | ICD-10-CM | POA: Insufficient documentation

## 2018-09-19 DIAGNOSIS — M47816 Spondylosis without myelopathy or radiculopathy, lumbar region: Secondary | ICD-10-CM | POA: Diagnosis not present

## 2018-09-19 DIAGNOSIS — M48061 Spinal stenosis, lumbar region without neurogenic claudication: Secondary | ICD-10-CM | POA: Diagnosis not present

## 2018-09-19 DIAGNOSIS — M25561 Pain in right knee: Secondary | ICD-10-CM | POA: Diagnosis not present

## 2018-09-19 DIAGNOSIS — M7918 Myalgia, other site: Secondary | ICD-10-CM | POA: Diagnosis not present

## 2018-09-25 DIAGNOSIS — H9313 Tinnitus, bilateral: Secondary | ICD-10-CM | POA: Diagnosis not present

## 2018-09-25 DIAGNOSIS — H6983 Other specified disorders of Eustachian tube, bilateral: Secondary | ICD-10-CM | POA: Diagnosis not present

## 2018-10-02 DIAGNOSIS — M1712 Unilateral primary osteoarthritis, left knee: Secondary | ICD-10-CM | POA: Diagnosis not present

## 2018-10-08 DIAGNOSIS — M549 Dorsalgia, unspecified: Secondary | ICD-10-CM | POA: Diagnosis not present

## 2018-10-08 DIAGNOSIS — Z713 Dietary counseling and surveillance: Secondary | ICD-10-CM | POA: Diagnosis not present

## 2018-10-08 DIAGNOSIS — Z9884 Bariatric surgery status: Secondary | ICD-10-CM | POA: Diagnosis not present

## 2018-10-08 DIAGNOSIS — M25569 Pain in unspecified knee: Secondary | ICD-10-CM | POA: Diagnosis not present

## 2018-10-08 DIAGNOSIS — Z6841 Body Mass Index (BMI) 40.0 and over, adult: Secondary | ICD-10-CM | POA: Diagnosis not present

## 2018-10-10 DIAGNOSIS — I10 Essential (primary) hypertension: Secondary | ICD-10-CM | POA: Diagnosis not present

## 2018-10-10 DIAGNOSIS — G4733 Obstructive sleep apnea (adult) (pediatric): Secondary | ICD-10-CM | POA: Diagnosis not present

## 2018-10-11 DIAGNOSIS — M48061 Spinal stenosis, lumbar region without neurogenic claudication: Secondary | ICD-10-CM | POA: Diagnosis not present

## 2018-10-11 DIAGNOSIS — Z6841 Body Mass Index (BMI) 40.0 and over, adult: Secondary | ICD-10-CM | POA: Diagnosis not present

## 2018-10-11 DIAGNOSIS — M1712 Unilateral primary osteoarthritis, left knee: Secondary | ICD-10-CM | POA: Diagnosis not present

## 2018-11-01 DIAGNOSIS — M545 Low back pain: Secondary | ICD-10-CM | POA: Diagnosis not present

## 2018-11-01 DIAGNOSIS — M47816 Spondylosis without myelopathy or radiculopathy, lumbar region: Secondary | ICD-10-CM | POA: Diagnosis not present

## 2018-11-01 DIAGNOSIS — G8929 Other chronic pain: Secondary | ICD-10-CM | POA: Diagnosis not present

## 2018-11-19 DIAGNOSIS — G4733 Obstructive sleep apnea (adult) (pediatric): Secondary | ICD-10-CM | POA: Diagnosis not present

## 2018-11-19 DIAGNOSIS — Z713 Dietary counseling and surveillance: Secondary | ICD-10-CM | POA: Diagnosis not present

## 2018-11-19 DIAGNOSIS — I10 Essential (primary) hypertension: Secondary | ICD-10-CM | POA: Diagnosis not present

## 2018-11-19 DIAGNOSIS — Z9884 Bariatric surgery status: Secondary | ICD-10-CM | POA: Diagnosis not present

## 2018-11-22 DIAGNOSIS — W19XXXA Unspecified fall, initial encounter: Secondary | ICD-10-CM | POA: Diagnosis not present

## 2018-11-22 DIAGNOSIS — M545 Low back pain: Secondary | ICD-10-CM | POA: Diagnosis not present

## 2018-11-22 DIAGNOSIS — M542 Cervicalgia: Secondary | ICD-10-CM | POA: Diagnosis not present

## 2018-11-22 DIAGNOSIS — M546 Pain in thoracic spine: Secondary | ICD-10-CM | POA: Diagnosis not present

## 2018-11-23 DIAGNOSIS — M47816 Spondylosis without myelopathy or radiculopathy, lumbar region: Secondary | ICD-10-CM | POA: Insufficient documentation

## 2018-11-23 DIAGNOSIS — M47814 Spondylosis without myelopathy or radiculopathy, thoracic region: Secondary | ICD-10-CM | POA: Insufficient documentation

## 2018-11-26 DIAGNOSIS — M4726 Other spondylosis with radiculopathy, lumbar region: Secondary | ICD-10-CM | POA: Insufficient documentation

## 2018-12-02 DIAGNOSIS — M47816 Spondylosis without myelopathy or radiculopathy, lumbar region: Secondary | ICD-10-CM | POA: Diagnosis not present

## 2018-12-12 DIAGNOSIS — R0602 Shortness of breath: Secondary | ICD-10-CM | POA: Diagnosis not present

## 2018-12-12 DIAGNOSIS — Z6841 Body Mass Index (BMI) 40.0 and over, adult: Secondary | ICD-10-CM | POA: Diagnosis not present

## 2018-12-12 DIAGNOSIS — R0789 Other chest pain: Secondary | ICD-10-CM | POA: Diagnosis not present

## 2018-12-12 DIAGNOSIS — I10 Essential (primary) hypertension: Secondary | ICD-10-CM | POA: Diagnosis not present

## 2018-12-12 DIAGNOSIS — E669 Obesity, unspecified: Secondary | ICD-10-CM | POA: Diagnosis not present

## 2019-01-01 DIAGNOSIS — M47816 Spondylosis without myelopathy or radiculopathy, lumbar region: Secondary | ICD-10-CM | POA: Diagnosis not present

## 2019-01-09 DIAGNOSIS — G4733 Obstructive sleep apnea (adult) (pediatric): Secondary | ICD-10-CM | POA: Diagnosis not present

## 2019-01-09 DIAGNOSIS — I10 Essential (primary) hypertension: Secondary | ICD-10-CM | POA: Diagnosis not present

## 2019-01-27 DIAGNOSIS — M47816 Spondylosis without myelopathy or radiculopathy, lumbar region: Secondary | ICD-10-CM | POA: Diagnosis not present

## 2019-01-27 DIAGNOSIS — M47812 Spondylosis without myelopathy or radiculopathy, cervical region: Secondary | ICD-10-CM | POA: Diagnosis not present

## 2019-02-10 DIAGNOSIS — I10 Essential (primary) hypertension: Secondary | ICD-10-CM | POA: Diagnosis not present

## 2019-02-10 DIAGNOSIS — Z713 Dietary counseling and surveillance: Secondary | ICD-10-CM | POA: Diagnosis not present

## 2019-02-10 DIAGNOSIS — Z9884 Bariatric surgery status: Secondary | ICD-10-CM | POA: Diagnosis not present

## 2019-02-20 DIAGNOSIS — E782 Mixed hyperlipidemia: Secondary | ICD-10-CM | POA: Diagnosis not present

## 2019-02-20 DIAGNOSIS — I10 Essential (primary) hypertension: Secondary | ICD-10-CM | POA: Diagnosis not present

## 2019-02-20 DIAGNOSIS — S29011A Strain of muscle and tendon of front wall of thorax, initial encounter: Secondary | ICD-10-CM | POA: Diagnosis not present

## 2019-02-24 DIAGNOSIS — R51 Headache: Secondary | ICD-10-CM | POA: Diagnosis not present

## 2019-03-06 DIAGNOSIS — M1712 Unilateral primary osteoarthritis, left knee: Secondary | ICD-10-CM | POA: Diagnosis not present

## 2019-03-14 DIAGNOSIS — G8929 Other chronic pain: Secondary | ICD-10-CM | POA: Diagnosis not present

## 2019-03-14 DIAGNOSIS — Z79899 Other long term (current) drug therapy: Secondary | ICD-10-CM | POA: Diagnosis not present

## 2019-03-14 DIAGNOSIS — Z6841 Body Mass Index (BMI) 40.0 and over, adult: Secondary | ICD-10-CM | POA: Diagnosis not present

## 2019-03-14 DIAGNOSIS — G894 Chronic pain syndrome: Secondary | ICD-10-CM | POA: Diagnosis not present

## 2019-03-14 DIAGNOSIS — M47816 Spondylosis without myelopathy or radiculopathy, lumbar region: Secondary | ICD-10-CM | POA: Diagnosis not present

## 2019-03-14 DIAGNOSIS — Z5181 Encounter for therapeutic drug level monitoring: Secondary | ICD-10-CM | POA: Diagnosis not present

## 2019-03-14 DIAGNOSIS — G629 Polyneuropathy, unspecified: Secondary | ICD-10-CM | POA: Diagnosis not present

## 2019-03-14 DIAGNOSIS — M25562 Pain in left knee: Secondary | ICD-10-CM | POA: Diagnosis not present

## 2019-03-28 DIAGNOSIS — M47816 Spondylosis without myelopathy or radiculopathy, lumbar region: Secondary | ICD-10-CM | POA: Diagnosis not present

## 2019-04-10 DIAGNOSIS — I10 Essential (primary) hypertension: Secondary | ICD-10-CM | POA: Diagnosis not present

## 2019-04-10 DIAGNOSIS — G4733 Obstructive sleep apnea (adult) (pediatric): Secondary | ICD-10-CM | POA: Diagnosis not present

## 2019-04-18 DIAGNOSIS — M545 Low back pain: Secondary | ICD-10-CM | POA: Diagnosis not present

## 2019-04-18 DIAGNOSIS — G8929 Other chronic pain: Secondary | ICD-10-CM | POA: Diagnosis not present

## 2019-04-18 DIAGNOSIS — M47816 Spondylosis without myelopathy or radiculopathy, lumbar region: Secondary | ICD-10-CM | POA: Diagnosis not present

## 2019-05-01 DIAGNOSIS — U071 COVID-19: Secondary | ICD-10-CM | POA: Diagnosis not present

## 2019-05-01 DIAGNOSIS — J029 Acute pharyngitis, unspecified: Secondary | ICD-10-CM | POA: Diagnosis not present

## 2019-05-01 DIAGNOSIS — R05 Cough: Secondary | ICD-10-CM | POA: Diagnosis not present

## 2019-05-12 DIAGNOSIS — Z9884 Bariatric surgery status: Secondary | ICD-10-CM | POA: Diagnosis not present

## 2019-05-12 DIAGNOSIS — Z20828 Contact with and (suspected) exposure to other viral communicable diseases: Secondary | ICD-10-CM | POA: Diagnosis not present

## 2019-05-12 DIAGNOSIS — R635 Abnormal weight gain: Secondary | ICD-10-CM | POA: Diagnosis not present

## 2019-05-12 DIAGNOSIS — Z713 Dietary counseling and surveillance: Secondary | ICD-10-CM | POA: Diagnosis not present

## 2019-08-06 DIAGNOSIS — M5416 Radiculopathy, lumbar region: Secondary | ICD-10-CM | POA: Insufficient documentation

## 2021-04-21 DIAGNOSIS — M722 Plantar fascial fibromatosis: Secondary | ICD-10-CM | POA: Insufficient documentation

## 2021-12-02 DIAGNOSIS — M5416 Radiculopathy, lumbar region: Secondary | ICD-10-CM | POA: Insufficient documentation

## 2022-10-26 ENCOUNTER — Ambulatory Visit (INDEPENDENT_AMBULATORY_CARE_PROVIDER_SITE_OTHER): Payer: Medicaid Other

## 2022-10-26 ENCOUNTER — Ambulatory Visit (INDEPENDENT_AMBULATORY_CARE_PROVIDER_SITE_OTHER): Payer: Medicaid Other | Admitting: Podiatry

## 2022-10-26 DIAGNOSIS — M7752 Other enthesopathy of left foot: Secondary | ICD-10-CM

## 2022-10-26 DIAGNOSIS — M7751 Other enthesopathy of right foot: Secondary | ICD-10-CM | POA: Diagnosis not present

## 2022-10-26 DIAGNOSIS — M778 Other enthesopathies, not elsewhere classified: Secondary | ICD-10-CM

## 2022-10-26 DIAGNOSIS — M7750 Other enthesopathy of unspecified foot: Secondary | ICD-10-CM

## 2022-10-26 NOTE — Progress Notes (Signed)
Subjective:  Patient ID: Justin [name], male    DOB: [birth year],  MRN: [id number] HPI Chief Complaint  Patient presents with   Heel Spurs    Bilateral Heel spur, patient having pain on the medial side of the heel, rate of pain 8 out 10,  patient has a history of P.F. patient also has top of the foot pain    52 y.o. male presents with the above complaint.   ROS: Denies fever chills nausea vomit muscle aches pains calf pain back pain chest pain shortness of breath.  Past Medical History:  Diagnosis Date   Anxiety    Arthritis    Gout    Hypertension    Kidney stones age 65 to 55   throughout life several times, on allopurinol prophylaxis   Neuropathy    mainly left leg , but bilat legs s/p rapid weight loss and gastric bypass surgery   Obese    OSA on CPAP    uses cpap   Past Surgical History:  Procedure Laterality Date   CHOLECYSTECTOMY N/A 03/13/2016   Procedure: LAPAROSCOPIC CHOLECYSTECTOMY WITH INTRAOPERATIVE CHOLANGIOGRAM;  Surgeon: Gaynelle Adu, MD;  Location: [facility];  Service: General;  Laterality: N/A;   GASTRIC ROUX-EN-Y N/A 05/26/2014   Procedure: LAPAROSCOPIC ROUX-EN-Y GASTRIC BYPASS WITH UPPER ENDOSCOPY;  Surgeon: Atilano Ina, MD;  Location: [facility];  Service: General;  Laterality: N/A;   KNEE ARTHROSCOPY Left 10/04/2015   Procedure: LEFT KNEE ARTHROSCOPY WITH LATERAL RETINACULAR RELEASE MEDIAL PLICA INCISION;  Surgeon: Jodi Geralds, MD;  Location: [facility] OR;  Service: Orthopedics;  Laterality: Left;   LEFT HEART CATH AND CORONARY ANGIOGRAPHY N/A 11/02/2017   Procedure: LEFT HEART CATH AND CORONARY ANGIOGRAPHY;  Surgeon: Yates Decamp, MD;  Location: [facility] INVASIVE CV [facility];  Service: Cardiovascular;  Laterality: N/A;   LEFT HEART CATHETERIZATION WITH CORONARY ANGIOGRAM N/A 09/17/2013   Procedure: LEFT HEART CATHETERIZATION WITH CORONARY ANGIOGRAM;  Surgeon: Peter M Swaz[place]and, MD;  Location: [facility] CATH LAB;  Service: Cardiovascular;  Laterality: N/A;   llithotripsy      TOOTH EXTRACTION      UPPER GI ENDOSCOPY  05/26/2014   Procedure: UPPER GI ENDOSCOPY;  Surgeon: Atilano Ina, MD;  Location: [facility];  Service: General;;    Current Outpatient Medications:    allopurinol (ZYLOPRIM) 300 MG tablet, Take 1 tablet (300 mg total) by mouth daily., Disp: 90 tablet, Rfl: 1   amLODipine (NORVASC) 5 MG tablet, Take 5 mg by mouth daily., Disp: , Rfl:    busPIRone (BUSPAR) 15 MG tablet, TAKE 1 TABLET BY MOUTH TWICE DAILY, Disp: 180 tablet, Rfl: 0   carvedilol (COREG) 3.125 MG tablet, TAKE 1 TABLET BY MOUTH TWICE DAILY WITH A MEAL, Disp: 180 tablet, Rfl: 0   cholecalciferol (VITAMIN D) 1000 units tablet, Take 1,000 Units by mouth daily., Disp: , Rfl:    cyclobenzaprine (FLEXERIL) 5 MG tablet, TK 1 TO 2 TS PO QHS AND ATN PRF MSP OR TIGHTNESS (Patient not taking: Reported on 10/26/2022), Disp: , Rfl: 1   gabapentin (NEURONTIN) 300 MG capsule, Take 1 capsule (300 mg total) by mouth 3 (three) times daily., Disp: 270 capsule, Rfl: 0   HYDROcodone-acetaminophen (NORCO/VICODIN) 5-325 MG tablet, Take 1 tablet by mouth every 6 (six) hours as needed for moderate pain., Disp: 10 tablet, Rfl: 0   loratadine (CLARITIN) 10 MG tablet, Take 10 mg by mouth daily., Disp: , Rfl:    methocarbamol (ROBAXIN) 500 MG tablet, , Disp: , Rfl: 1   methylPREDNISolone (  MEDROL DOSEPAK) 4 MG TBPK tablet, 6 day dose pack - take as directed, Disp: 21 tablet, Rfl: 0   Multiple Vitamin (MULTIVITAMIN WITH MINERALS) TABS, Take 2 tablets by mouth every morning. , Disp: , Rfl:    nitroGLYCERIN (NITROSTAT) 0.4 MG SL tablet, Place 0.4 mg under the tongue every 5 (five) minutes as needed for chest pain., Disp: , Rfl:    omeprazole (PRILOSEC) 40 MG capsule, Take 1 capsule (40 mg total) by mouth daily., Disp: 90 capsule, Rfl: 1   ondansetron (ZOFRAN) 4 MG tablet, Take 1 tablet (4 mg total) by mouth every 8 (eight) hours as needed for nausea or vomiting., Disp: 20 tablet, Rfl: 0   pravastatin (PRAVACHOL) 40 MG tablet, TAKE 1 TABLET BY MOUTH  EVERY EVENING, Disp: 90 tablet, Rfl: 0   pregabalin (LYRICA) 100 MG capsule, Take 100 mg by mouth 3 (three) times daily., Disp: , Rfl:    traMADol (ULTRAM) 50 MG tablet, Take 1 tablet (50 mg total) by mouth every 8 (eight) hours as needed., Disp: 30 tablet, Rfl: 0   VYVANSE 40 MG capsule, Take 40 mg by mouth daily., Disp: , Rfl:    Zinc 50 MG TABS, Take 1 tablet by mouth every morning. , Disp: , Rfl:   Allergies  Allergen Reactions   Lamictal [Lamotrigine] Other (See Comments)    INCREASED LFTs   Tegretol [Carbamazepine] Other (See Comments)    INCREASED LFTS   Sulfonamide Derivatives Hives   Influenza Vaccines    Nsaids     Avoids due to gastric bypass surgergy   Review of Systems Objective:  There were no vitals filed for this visit.  General: Well developed, nourished, in no acute distress, alert and oriented x3   Dermatological: Skin is warm, dry and supple bilateral. Nails x 10 are well maintained; remaining integument appears unremarkable at this time. There are no open sores, no preulcerative lesions, no rash or signs of infection present.  Vascular: Dorsalis Pedis artery and Posterior Tibial artery pedal pulses are 2/4 bilateral with immedate capillary fill time. Pedal hair growth present. No varicosities and no lower extremity edema present bilateral.   Neruologic: Grossly intact via light touch bilateral. Vibratory intact via tuning fork bilateral. Protective threshold with Semmes Wienstein monofilament intact to all pedal sites bilateral. Patellar and Achilles deep tendon reflexes 2+ bilateral. No Babinski or clonus noted bilateral.   Musculoskeletal: No gross boney pedal deformities bilateral. No pain, crepitus, or limitation noted with foot and ankle range of motion bilateral. Muscular strength 5/5 in all groups tested bilateral.  Pain on palpation of the insertion of the Achilles tendon bilateral most likely bursitis.    Gait: Unassisted, Nonantalgic.     Radiographs:  Radiographs taken today demonstrate osseously mature individual.  Mineralization of the bone appears to be normal.  Retrocalcaneal heel spurs are noted.  Assessment & Plan:   Assessment: Bursitis Achilles tendinitis bilateral  Plan: Discussed etiology pathology conservative surgical therapies at this point I injected the bursa today with 3 mg of dexamethasone local anesthetic.  [name] on an as-needed basis or in 6 months.     Vista Sawatzky T. [place], [place]

## 2022-11-17 ENCOUNTER — Other Ambulatory Visit: Payer: Self-pay | Admitting: Podiatry

## 2022-11-17 DIAGNOSIS — M7750 Other enthesopathy of unspecified foot: Secondary | ICD-10-CM

## 2022-11-17 DIAGNOSIS — M7751 Other enthesopathy of right foot: Secondary | ICD-10-CM

## 2023-01-18 ENCOUNTER — Other Ambulatory Visit: Payer: Self-pay | Admitting: Orthopedic Surgery

## 2023-01-18 DIAGNOSIS — M25562 Pain in left knee: Secondary | ICD-10-CM

## 2023-01-26 ENCOUNTER — Other Ambulatory Visit: Payer: Medicaid Other

## 2023-01-30 ENCOUNTER — Ambulatory Visit
Admission: RE | Admit: 2023-01-30 | Discharge: 2023-01-30 | Disposition: A | Discharge status: Home / Self Care | Payer: Medicaid Other | Source: Ambulatory Visit | Attending: Orthopedic Surgery | Admitting: Orthopedic Surgery

## 2023-01-30 DIAGNOSIS — M25562 Pain in left knee: Secondary | ICD-10-CM

## 2023-02-22 ENCOUNTER — Other Ambulatory Visit: Payer: Self-pay | Admitting: Orthopedic Surgery

## 2023-02-26 NOTE — Patient Instructions (Signed)
SURGICAL WAITING ROOM VISITATION Patients having surgery or a procedure may have no more than 2 support people in the waiting area - these visitors may rotate.    Children under the age of 52 must have an adult with them who is not the patient.  If the patient needs to stay at the hospital during part of their recovery, the visitor guidelines for inpatient rooms apply. Pre-op nurse will coordinate an appropriate time for 1 support person to accompany patient in pre-op.  This support person may not rotate.    Please refer to the [facility] website for the visitor guidelines for Inpatients (after your surgery is over and you are in a regular room).       Your procedure is scheduled on: 03-12-23   Report to [facility] Main Entrance    Report to admitting at 12:45 PM   Call this number if you have problems the morning of surgery [phone]   Do not eat food :After Midnight.   After Midnight you may have the following liquids until 11:55 AM DAY OF SURGERY  Water Non-Citrus Juices (without pulp, NO RED-Apple, White grape, White cranberry) Black Coffee (NO MILK/CREAM OR CREAMERS, sugar ok)  Clear Tea (NO MILK/CREAM OR CREAMERS, sugar ok) regular and decaf                             Plain Jell-O (NO RED)                                           Fruit ices (not with fruit pulp, NO RED)                                     Popsicles (NO RED)                                                               Sports drinks like Gatorade (NO RED)                   The day of surgery:  Drink ONE (1) Pre-Surgery Clear Ensure at 11:55 AM the morning of surgery. Drink in one sitting. Do not sip.  This drink was given to you during your hospital  pre-op appointment visit. Nothing else to drink after completing the Pre-Surgery Clear Ensure.          If you have questions, please contact your surgeon's office.   FOLLOW ANY ADDITIONAL PRE OP INSTRUCTIONS YOU RECEIVED FROM YOUR  SURGEON'S OFFICE!!!     Oral Hygiene is also important to reduce your risk of infection.                                    Remember - BRUSH YOUR TEETH THE MORNING OF SURGERY WITH YOUR REGULAR TOOTHPASTE   Do NOT smoke after Midnight   Take these medicines the morning of surgery with A SIP OF WATER:   Allopurinol  Bupropion  Buspirone  Carvedilol  Divalproex  Escitalopram  Isosorbide  Pantoprazole             Pregabalin  Zyrtec  Okay to use inhalers  If needed may take Tramadol, Meclizine, Ubrelvy, Nitroglycerin, Ondansetron  Stop all vitamins and herbal supplements 7 days before surgery  Bring CPAP mask and tubing day of surgery.                              You may not have any metal on your body including  jewelry, and body piercing             Do not wear lotions, powders, cologne, or deodorant              Men may shave face and neck.   Do not bring valuables to the hospital. [facility] IS NOT RESPONSIBLE   FOR VALUABLES.   Contacts, dentures or bridgework may not be worn into surgery.   DO NOT BRING YOUR HOME MEDICATIONS TO THE HOSPITAL. PHARMACY WILL DISPENSE MEDICATIONS LISTED ON YOUR MEDICATION LIST TO YOU DURING YOUR ADMISSION IN THE HOSPITAL!    Patients discharged on the day of surgery will not be allowed to drive home.  Someone NEEDS to stay with you for the first 24 hours after anesthesia.               Please read over the following fact sheets you were given: IF YOU HAVE QUESTIONS ABOUT YOUR PRE-OP INSTRUCTIONS PLEASE CALL [phone] [name]  If you received a COVID test during your pre-op visit  it is requested that you wear a mask when out in public, stay away from anyone that may not be feeling well and notify your surgeon if you develop symptoms. If you test positive for Covid or have been in contact with anyone that has tested positive in the last 10 days please notify you surgeon.  [facility] - Preparing for Surgery Before surgery, you can play an  important role.  Because skin is not sterile, your skin needs to be as free of germs as possible.  You can reduce the number of germs on your skin by washing with CHG (chlorahexidine gluconate) soap before surgery.  CHG is an antiseptic cleaner which kills germs and bonds with the skin to continue killing germs even after washing. Please DO NOT use if you have an allergy to CHG or antibacterial soaps.  If your skin becomes reddened/irritated stop using the CHG and inform your nurse when you arrive at Short Stay. Do not shave (including legs and underarms) for at least 48 hours prior to the first CHG shower.  You may shave your face/neck.  Please follow these instructions carefully:  1.  Shower with CHG Soap the night before surgery and the  morning of surgery.  2.  If you choose to wash your hair, wash your hair first as usual with your normal  shampoo.  3.  After you shampoo, rinse your hair and body thoroughly to remove the shampoo.                             4.  Use CHG as you would any other liquid soap.  You can apply chg directly to the skin and wash.  Gently with a scrungie or clean washcloth.  5.  Apply the CHG Soap to your body ONLY FROM THE NECK DOWN.  Do   not use on face/ open                           Wound or open sores. Avoid contact with eyes, ears mouth and   genitals (private parts).                       Wash face,  Genitals (private parts) with your normal soap.             6.  Wash thoroughly, paying special attention to the area where your    surgery  will be performed.  7.  Thoroughly rinse your body with warm water from the neck down.  8.  DO NOT shower/wash with your normal soap after using and rinsing off the CHG Soap.                9.  Pat yourself dry with a clean towel.            10.  Wear clean pajamas.            11.  Place clean sheets on your bed the night of your first shower and do not  sleep with pets. Day of Surgery : Do not apply any lotions/deodorants the  morning of surgery.  Please wear clean clothes to the hospital/surgery center.  FAILURE TO FOLLOW THESE INSTRUCTIONS MAY RESULT IN THE CANCELLATION OF YOUR SURGERY  PATIENT SIGNATURE_________________________________  NURSE SIGNATURE__________________________________  ________________________________________________________________________    Justin [name]  An incentive spirometer is a tool that can help keep your lungs clear and active. This tool measures how well you are filling your lungs with each breath. Taking long deep breaths may help reverse or decrease the chance of developing breathing (pulmonary) problems (especially infection) following: A long period of time when you are unable to move or be active. BEFORE THE PROCEDURE  If the spirometer includes an indicator to show your best effort, your nurse or respiratory therapist will set it to a desired goal. If possible, sit up straight or lean slightly forward. Try not to slouch. Hold the incentive spirometer in an upright position. INSTRUCTIONS FOR USE  Sit on the edge of your bed if possible, or sit up as far as you can in bed or on a chair. Hold the incentive spirometer in an upright position. Breathe out normally. Place the mouthpiece in your mouth and seal your lips tightly around it. Breathe in slowly and as deeply as possible, raising the piston or the ball toward the top of the column. Hold your breath for 3-5 seconds or for as long as possible. Allow the piston or ball to fall to the bottom of the column. Remove the mouthpiece from your mouth and breathe out normally. Rest for a few seconds and repeat Steps 1 through 7 at least 10 times every 1-2 hours when you are awake. Take your time and take a few normal breaths between deep breaths. The spirometer may include an indicator to show your best effort. Use the indicator as a goal to work toward during each repetition. After each set of 10 deep breaths, practice  coughing to be sure your lungs are clear. If you have an incision (the cut made at the time of surgery), support your incision when coughing by placing a pillow or rolled up towels firmly against it. Once you are able to get out of bed, walk around indoors and  cough well. You may stop using the incentive spirometer when instructed by your caregiver.  RISKS AND COMPLICATIONS Take your time so you do not get dizzy or light-headed. If you are in pain, you may need to take or ask for pain medication before doing incentive spirometry. It is harder to take a deep breath if you are having pain. AFTER USE Rest and breathe slowly and easily. It can be helpful to keep track of a log of your progress. Your caregiver can provide you with a simple table to help with this. If you are using the spirometer at home, follow these instructions: SEEK MEDICAL CARE IF:  You are having difficultly using the spirometer. You have trouble using the spirometer as often as instructed. Your pain medication is not giving enough relief while using the spirometer. You develop fever of 100.5 F (38.1 C) or higher. SEEK IMMEDIATE MEDICAL CARE IF:  You cough up bloody sputum that had not been present before. You develop fever of 102 F (38.9 C) or greater. You develop worsening pain at or near the incision site. MAKE SURE YOU:  Understand these instructions. Will watch your condition. Will get help right away if you are not doing well or get worse. Document Released: 10/16/2006 Document Revised: 08/28/2011 Document Reviewed: 12/17/2006 [facility] Patient Information 2014 [place], [place].   ________________________________________________________________________

## 2023-02-26 NOTE — Progress Notes (Signed)
COVID Vaccine Completed:  Yes  Date of COVID positive in last 90 days:  PCP - Irving Copas, PA-C Cardiologist - Kennon Rounds, MD (last OV 2022) Neurologist - [facility]  Chest x-ray -  EKG -  Stress Test -  ECHO - 09-06-17 Epic Cardiac Cath - 11-02-17 Epic Pacemaker/ICD device last checked: Spinal Cord Stimulator:  Bowel Prep -   Sleep Study - Yes, +sleep apnea CPAP -   Fasting Blood Sugar -  Checks Blood Sugar _____ times a day  Last dose of GLP1 agonist-  N/A GLP1 instructions:  N/A   Last dose of SGLT-2 inhibitors-  N/A SGLT-2 instructions: N/A   Blood Thinner Instructions:  Time Aspirin Instructions: Last Dose:  Activity level:  Can go up a flight of stairs and perform activities of daily living without stopping and without symptoms of chest pain or shortness of breath.  Able to exercise without symptoms  Unable to go up a flight of stairs without symptoms of     Anesthesia review:  Chest pain syndrome, HTN, OSA, ?epilepsy  Patient denies shortness of breath, fever, cough and chest pain at PAT appointment  Patient verbalized understanding of instructions that were given to them at the PAT appointment. Patient was also instructed that they will need to review over the PAT instructions again at home before surgery.

## 2023-02-28 ENCOUNTER — Other Ambulatory Visit: Payer: Self-pay

## 2023-02-28 ENCOUNTER — Encounter (HOSPITAL_COMMUNITY): Payer: Self-pay

## 2023-02-28 ENCOUNTER — Encounter (HOSPITAL_COMMUNITY)
Admission: RE | Admit: 2023-02-28 | Discharge: 2023-02-28 | Disposition: A | Discharge status: Home / Self Care | Payer: Medicaid Other | Source: Ambulatory Visit | Attending: Orthopedic Surgery

## 2023-02-28 VITALS — BP 128/91 | HR 83 | Temp 98.1°F | Resp 20 | Ht 68.0 in | Wt 357.4 lb

## 2023-02-28 DIAGNOSIS — I10 Essential (primary) hypertension: Secondary | ICD-10-CM | POA: Diagnosis not present

## 2023-02-28 DIAGNOSIS — Z01818 Encounter for other preprocedural examination: Secondary | ICD-10-CM | POA: Diagnosis present

## 2023-02-28 HISTORY — DX: Personal history of urinary calculi: Z87.442

## 2023-02-28 HISTORY — DX: Tremor, unspecified: R25.1

## 2023-02-28 HISTORY — DX: Depression, unspecified: F32.A

## 2023-02-28 HISTORY — DX: Migraine, unspecified, not intractable, without status migrainosus: G43.909

## 2023-02-28 LAB — BASIC METABOLIC PANEL
Anion gap: 9 (ref 5–15)
BUN: 17 mg/dL (ref 6–20)
CO2: 27 mmol/L (ref 22–32)
Calcium: 8.9 mg/dL (ref 8.9–10.3)
Chloride: 101 mmol/L (ref 98–111)
Creatinine, Ser: 1.03 mg/dL (ref 0.61–1.24)
GFR, Estimated: >60 mL/min (ref >60)
Glucose, Bld: 91 mg/dL (ref 70–99)
Potassium: 3.6 mmol/L (ref 3.5–5.1)
Sodium: 137 mmol/L (ref 135–145)

## 2023-03-02 ENCOUNTER — Other Ambulatory Visit: Payer: Self-pay

## 2023-03-03 ENCOUNTER — Other Ambulatory Visit (HOSPITAL_COMMUNITY): Payer: Self-pay

## 2023-03-05 ENCOUNTER — Other Ambulatory Visit (HOSPITAL_COMMUNITY): Payer: Self-pay

## 2023-03-06 ENCOUNTER — Other Ambulatory Visit (HOSPITAL_COMMUNITY): Payer: Self-pay

## 2023-03-06 MED ORDER — SEMAGLUTIDE-WEIGHT MANAGEMENT 0.25 MG/0.5ML ~~LOC~~ SOAJ
0.2500 mg | SUBCUTANEOUS | 0 refills | Status: AC
  Filled 2023-03-06: qty 2, 28d supply, fill #0

## 2023-03-07 ENCOUNTER — Other Ambulatory Visit (HOSPITAL_COMMUNITY): Payer: Self-pay

## 2023-03-12 ENCOUNTER — Encounter (HOSPITAL_COMMUNITY): Payer: Self-pay | Admitting: Orthopedic Surgery

## 2023-03-12 ENCOUNTER — Other Ambulatory Visit: Payer: Self-pay

## 2023-03-12 ENCOUNTER — Ambulatory Visit (HOSPITAL_BASED_OUTPATIENT_CLINIC_OR_DEPARTMENT_OTHER): Payer: Self-pay | Admitting: Anesthesiology

## 2023-03-12 ENCOUNTER — Ambulatory Visit (HOSPITAL_COMMUNITY)
Admission: RE | Admit: 2023-03-12 | Discharge: 2023-03-12 | Disposition: A | Discharge status: Home / Self Care | Payer: Medicaid Other | Attending: Orthopedic Surgery | Admitting: Orthopedic Surgery

## 2023-03-12 ENCOUNTER — Encounter (HOSPITAL_COMMUNITY)
Admission: RE | Disposition: A | Discharge status: Home / Self Care | Payer: Self-pay | Source: Home / Self Care | Attending: Orthopedic Surgery

## 2023-03-12 ENCOUNTER — Ambulatory Visit (HOSPITAL_COMMUNITY): Payer: Medicaid Other | Admitting: Physician Assistant

## 2023-03-12 DIAGNOSIS — F319 Bipolar disorder, unspecified: Secondary | ICD-10-CM | POA: Diagnosis not present

## 2023-03-12 DIAGNOSIS — M2342 Loose body in knee, left knee: Secondary | ICD-10-CM | POA: Diagnosis not present

## 2023-03-12 DIAGNOSIS — M2242 Chondromalacia patellae, left knee: Secondary | ICD-10-CM

## 2023-03-12 DIAGNOSIS — Z6841 Body Mass Index (BMI) 40.0 and over, adult: Secondary | ICD-10-CM | POA: Diagnosis not present

## 2023-03-12 DIAGNOSIS — M94262 Chondromalacia, left knee: Secondary | ICD-10-CM | POA: Diagnosis present

## 2023-03-12 DIAGNOSIS — F419 Anxiety disorder, unspecified: Secondary | ICD-10-CM | POA: Insufficient documentation

## 2023-03-12 DIAGNOSIS — I10 Essential (primary) hypertension: Secondary | ICD-10-CM | POA: Insufficient documentation

## 2023-03-12 DIAGNOSIS — Z79899 Other long term (current) drug therapy: Secondary | ICD-10-CM | POA: Diagnosis not present

## 2023-03-12 DIAGNOSIS — G4730 Sleep apnea, unspecified: Secondary | ICD-10-CM | POA: Insufficient documentation

## 2023-03-12 HISTORY — PX: KNEE ARTHROSCOPY: SHX127

## 2023-03-12 SURGERY — ARTHROSCOPY, KNEE
Anesthesia: General | Site: Knee | Laterality: Left

## 2023-03-12 MED ORDER — CEFAZOLIN SODIUM 1 G IJ SOLR
INTRAMUSCULAR | Status: AC
  Filled 2023-03-12: qty 10

## 2023-03-12 MED ORDER — ROCURONIUM BROMIDE 100 MG/10ML IV SOLN
INTRAVENOUS | Status: DC | PRN
Start: 2023-03-12 — End: 2023-03-12
  Administered 2023-03-12: 80 mg via INTRAVENOUS

## 2023-03-12 MED ORDER — PROPOFOL 1000 MG/100ML IV EMUL
INTRAVENOUS | Status: AC
  Filled 2023-03-12: qty 400

## 2023-03-12 MED ORDER — SUGAMMADEX SODIUM 200 MG/2ML IV SOLN
INTRAVENOUS | Status: DC | PRN
  Administered 2023-03-12: 400 mg via INTRAVENOUS

## 2023-03-12 MED ORDER — OXYCODONE HCL 5 MG/5ML PO SOLN: 5.0000 mg | Freq: Once | ORAL | Status: DC | PRN

## 2023-03-12 MED ORDER — LACTATED RINGERS IV SOLN: INTRAVENOUS | Status: DC

## 2023-03-12 MED ORDER — ONDANSETRON HCL 4 MG/2ML IJ SOLN
INTRAMUSCULAR | Status: DC | PRN
  Administered 2023-03-12: 4 mg via INTRAVENOUS

## 2023-03-12 MED ORDER — FENTANYL CITRATE (PF) 250 MCG/5ML IJ SOLN
INTRAMUSCULAR | Status: AC
  Filled 2023-03-12: qty 5

## 2023-03-12 MED ORDER — FENTANYL CITRATE PF 50 MCG/ML IJ SOSY: 50.0000 ug | PREFILLED_SYRINGE | INTRAMUSCULAR | Status: DC

## 2023-03-12 MED ORDER — EPINEPHRINE PF 1 MG/ML IJ SOLN
INTRAMUSCULAR | Status: AC
  Filled 2023-03-12: qty 1

## 2023-03-12 MED ORDER — SODIUM CHLORIDE 0.9 % IR SOLN
Status: DC | PRN
  Administered 2023-03-12: 3000 mL

## 2023-03-12 MED ORDER — PROPOFOL 10 MG/ML IV BOLUS
INTRAVENOUS | Status: DC | PRN
  Administered 2023-03-12: 300 mg via INTRAVENOUS

## 2023-03-12 MED ORDER — FENTANYL CITRATE (PF) 100 MCG/2ML IJ SOLN
INTRAMUSCULAR | Status: DC | PRN
  Administered 2023-03-12: 100 ug via INTRAVENOUS
  Administered 2023-03-12 (×3): 50 ug via INTRAVENOUS

## 2023-03-12 MED ORDER — DROPERIDOL 2.5 MG/ML IJ SOLN: 0.6250 mg | Freq: Once | INTRAMUSCULAR | Status: DC | PRN

## 2023-03-12 MED ORDER — LIDOCAINE HCL (CARDIAC) PF 100 MG/5ML IV SOSY
PREFILLED_SYRINGE | INTRAVENOUS | Status: DC | PRN
Start: 2023-03-12 — End: 2023-03-12
  Administered 2023-03-12: 100 mg via INTRATRACHEAL

## 2023-03-12 MED ORDER — ONDANSETRON HCL 4 MG/2ML IJ SOLN
INTRAMUSCULAR | Status: AC
  Filled 2023-03-12: qty 2

## 2023-03-12 MED ORDER — PROPOFOL 500 MG/50ML IV EMUL
INTRAVENOUS | Status: DC | PRN
Start: 2023-03-12 — End: 2023-03-12
  Administered 2023-03-12: 50 ug/kg/min via INTRAVENOUS

## 2023-03-12 MED ORDER — BUPIVACAINE HCL (PF) 0.5 % IJ SOLN
INTRAMUSCULAR | Status: AC
  Filled 2023-03-12: qty 30

## 2023-03-12 MED ORDER — CEFAZOLIN IN SODIUM CHLORIDE 3-0.9 GM/100ML-% IV SOLN
3.0000 g | INTRAVENOUS | Status: DC
  Filled 2023-03-12: qty 100

## 2023-03-12 MED ORDER — MIDAZOLAM HCL 2 MG/2ML IJ SOLN
INTRAMUSCULAR | Status: AC
  Filled 2023-03-12: qty 2

## 2023-03-12 MED ORDER — PROMETHAZINE HCL 25 MG/ML IJ SOLN: 6.2500 mg | INTRAMUSCULAR | Status: DC | PRN

## 2023-03-12 MED ORDER — MIDAZOLAM HCL 2 MG/2ML IJ SOLN: 1.0000 mg | INTRAMUSCULAR | Status: DC

## 2023-03-12 MED ORDER — MEPERIDINE HCL 50 MG/ML IJ SOLN: 6.2500 mg | INTRAMUSCULAR | Status: DC | PRN

## 2023-03-12 MED ORDER — PROPOFOL 500 MG/50ML IV EMUL
INTRAVENOUS | Status: AC
  Filled 2023-03-12: qty 100

## 2023-03-12 MED ORDER — CEFAZOLIN SODIUM-DEXTROSE 2-4 GM/100ML-% IV SOLN: 2.0000 g | INTRAVENOUS | Status: DC

## 2023-03-12 MED ORDER — CHLORHEXIDINE GLUCONATE 0.12 % MT SOLN
15.0000 mL | Freq: Once | OROMUCOSAL | Status: AC
  Administered 2023-03-12: 15 mL via OROMUCOSAL

## 2023-03-12 MED ORDER — DEXAMETHASONE SODIUM PHOSPHATE 10 MG/ML IJ SOLN
INTRAMUSCULAR | Status: AC
  Filled 2023-03-12: qty 1

## 2023-03-12 MED ORDER — MIDAZOLAM HCL 5 MG/5ML IJ SOLN
INTRAMUSCULAR | Status: DC | PRN
  Administered 2023-03-12: 2 mg via INTRAVENOUS

## 2023-03-12 MED ORDER — CEFAZOLIN SODIUM-DEXTROSE 2-3 GM-%(50ML) IV SOLR
INTRAVENOUS | Status: DC | PRN
  Administered 2023-03-12: 3 g via INTRAVENOUS

## 2023-03-12 MED ORDER — BUPIVACAINE HCL (PF) 0.5 % IJ SOLN
INTRAMUSCULAR | Status: DC | PRN
  Administered 2023-03-12: 30 mL

## 2023-03-12 MED ORDER — ORAL CARE MOUTH RINSE: 15.0000 mL | Freq: Once | OROMUCOSAL | Status: AC

## 2023-03-12 MED ORDER — LACTATED RINGERS IV SOLN: INTRAVENOUS | Status: DC | PRN

## 2023-03-12 MED ORDER — EPINEPHRINE PF 1 MG/ML IJ SOLN
INTRAMUSCULAR | Status: DC | PRN
  Administered 2023-03-12: 1 mg

## 2023-03-12 MED ORDER — HYDROMORPHONE HCL 1 MG/ML IJ SOLN: 0.2500 mg | INTRAMUSCULAR | Status: DC | PRN

## 2023-03-12 MED ORDER — HYDROCODONE-ACETAMINOPHEN 5-325 MG PO TABS
1.0000 | ORAL_TABLET | Freq: Four times a day (QID) | ORAL | 0 refills | Status: AC | PRN
Start: 2023-03-12 — End: ?

## 2023-03-12 MED ORDER — OXYCODONE HCL 5 MG PO TABS: 5.0000 mg | ORAL_TABLET | Freq: Once | ORAL | Status: DC | PRN

## 2023-03-12 SURGICAL SUPPLY — 38 items
BAG COUNTER SPONGE SURGICOUNT (BAG) IMPLANT
BAG SPNG CNTER NS LX DISP (BAG)
BLADE EXCALIBUR 4.0X13 (MISCELLANEOUS) IMPLANT
BNDG CMPR 6 X 5 YARDS HK CLSR (GAUZE/BANDAGES/DRESSINGS) ×1
BNDG CMPR MED 10X6 ELC LF (GAUZE/BANDAGES/DRESSINGS) ×1
BNDG ELASTIC 6INX 5YD STR LF (GAUZE/BANDAGES/DRESSINGS) ×1 IMPLANT
BNDG ELASTIC 6X10 VLCR STRL LF (GAUZE/BANDAGES/DRESSINGS) IMPLANT
BOOTIES KNEE HIGH SLOAN (MISCELLANEOUS) ×2 IMPLANT
DISSECTOR 4.0MM X 13CM (MISCELLANEOUS) IMPLANT
DRAPE U-SHAPE 47X51 STRL (DRAPES) ×1 IMPLANT
DRSG ADAPTIC 3X8 NADH LF (GAUZE/BANDAGES/DRESSINGS) IMPLANT
DRSG EMULSION OIL 3X3 NADH (GAUZE/BANDAGES/DRESSINGS) ×1 IMPLANT
DURAPREP 26ML APPLICATOR (WOUND CARE) ×1 IMPLANT
FILTER STRAW (MISCELLANEOUS) ×1 IMPLANT
GAUZE 4X4 16PLY ~~LOC~~+RFID DBL (SPONGE) ×1 IMPLANT
GAUZE PAD ABD 7.5X8 STRL (GAUZE/BANDAGES/DRESSINGS) IMPLANT
GAUZE PAD ABD 8X10 STRL (GAUZE/BANDAGES/DRESSINGS) ×2 IMPLANT
GAUZE SPONGE 4X4 12PLY STRL (GAUZE/BANDAGES/DRESSINGS) ×1 IMPLANT
GLOVE BIOGEL PI IND STRL 8 (GLOVE) ×2 IMPLANT
GLOVE ECLIPSE 7.5 STRL STRAW (GLOVE) ×2 IMPLANT
GOWN STRL REUS W/ TWL XL LVL3 (GOWN DISPOSABLE) ×2 IMPLANT
GOWN STRL REUS W/TWL XL LVL3 (GOWN DISPOSABLE) ×2
KIT BASIN OR (CUSTOM PROCEDURE TRAY) ×1 IMPLANT
KIT TURNOVER KIT A (KITS) IMPLANT
MANIFOLD NEPTUNE II (INSTRUMENTS) ×1 IMPLANT
NDL HYPO 22X1.5 SAFETY MO (MISCELLANEOUS) ×1 IMPLANT
NDL SAFETY ECLIP 18X1.5 (MISCELLANEOUS) ×1 IMPLANT
NEEDLE HYPO 22X1.5 SAFETY MO (MISCELLANEOUS) ×1 IMPLANT
PACK ARTHROSCOPY WL (CUSTOM PROCEDURE TRAY) ×1 IMPLANT
PAD MASON LEG HOLDER (PIN) IMPLANT
PADDING CAST COTTON 6X4 STRL (CAST SUPPLIES) ×1 IMPLANT
PENCIL SMOKE EVACUATOR (MISCELLANEOUS) IMPLANT
SUT ETHILON 4 0 PS 2 18 (SUTURE) IMPLANT
SYR 3ML LL SCALE MARK (SYRINGE) ×1 IMPLANT
TOWEL OR 17X26 10 PK STRL BLUE (TOWEL DISPOSABLE) ×1 IMPLANT
TUBING ARTHROSCOPY IRRIG 16FT (MISCELLANEOUS) ×1 IMPLANT
WAND ABLATOR APOLLO I90 (BUR) IMPLANT
WRAP KNEE MAXI GEL POST OP (GAUZE/BANDAGES/DRESSINGS) ×1 IMPLANT

## 2023-03-12 NOTE — Brief Op Note (Signed)
03/12/2023  4:11 PM  PATIENT:  Justin [name]  52 y.o. male  PRE-OPERATIVE DIAGNOSIS:  LEFT KNEE CHONDROMALACIA  POST-OPERATIVE DIAGNOSIS:  LEFT KNEE CHONDROMALACIA  PROCEDURE:  Procedure(s): LEFT ARTHROSCOPY KNEE WITH DEBRIDEMENT AND CHONDROPLASTY MEDIAL, LATERAL, AND PATELLA FEMORAL (Left)  SURGEON:  Surgeons and Role:    Jodi Geralds, MD - Primary  PHYSICIAN ASSISTANT:   ASSISTANTS: Gus Puma   ANESTHESIA:   general  EBL:  none   BLOOD ADMINISTERED:none  DRAINS: none   LOCAL MEDICATIONS USED:  MARCAINE     SPECIMEN:  No Specimen  DISPOSITION OF SPECIMEN:  N/A  COUNTS:  YES  TOURNIQUET:  * No tourniquets in log *  DICTATION: .Other Dictation: Dictation Number [id number]  PLAN OF CARE: Discharge to home after PACU  PATIENT DISPOSITION:  PACU - hemodynamically stable.   Delay start of Pharmacological VTE agent (>24hrs) due to surgical blood loss or risk of bleeding: no

## 2023-03-12 NOTE — Anesthesia Postprocedure Evaluation (Signed)
Anesthesia Post Note  Patient: Justin [name]  Procedure(s) Performed: LEFT ARTHROSCOPY KNEE WITH DEBRIDEMENT AND CHONDROPLASTY MEDIAL, LATERAL, AND PATELLA FEMORAL (Left: Knee)     Patient location during evaluation: PACU Anesthesia Type: General Level of consciousness: awake and alert, oriented and patient cooperative Pain management: pain level controlled Vital Signs Assessment: post-procedure vital signs reviewed and stable Respiratory status: spontaneous breathing, nonlabored ventilation and respiratory function stable Cardiovascular status: blood pressure returned to baseline and stable Postop Assessment: no apparent nausea or vomiting Anesthetic complications: no   No notable events documented.  Last Vitals:  Vitals:   03/12/23 1630 03/12/23 1650  BP: 117/78 123/80  Pulse: 83 78  Resp: (!) 25 20  Temp: 36.6 C   SpO2: 94% 94%    Last Pain:  Vitals:   03/12/23 1650  TempSrc:   PainSc: 0-No pain                 Lannie Fields

## 2023-03-12 NOTE — H&P (Signed)
A pre op hand p   Chief Complaint: Left knee pain  HPI: Justin [name] is a 52 y.o. male who presents for evaluation of left knee pain. It has been present for greater than 3 months and has been worsening. He has failed conservative measures. Pain is rated as moderate.  Past Medical History:  Diagnosis Date   Anxiety    Arthritis    Depression    Gout    History of kidney stones    Hypertension    Kidney stones age 43 to 15   throughout life several times, on allopurinol prophylaxis   Migraine headache    Neuropathy    mainly left leg , but bilat legs s/p rapid weight loss and gastric bypass surgery   Obese    OSA on CPAP    uses cpap   Tremor    Past Surgical History:  Procedure Laterality Date   CHOLECYSTECTOMY N/A 03/13/2016   Procedure: LAPAROSCOPIC CHOLECYSTECTOMY WITH INTRAOPERATIVE CHOLANGIOGRAM;  Surgeon: Gaynelle Adu, MD;  Location: [facility] ORS;  Service: General;  Laterality: N/A;   GASTRIC ROUX-EN-Y N/A 05/26/2014   Procedure: LAPAROSCOPIC ROUX-EN-Y GASTRIC BYPASS WITH UPPER ENDOSCOPY;  Surgeon: Atilano Ina, MD;  Location: [facility] ORS;  Service: General;  Laterality: N/A;   KNEE ARTHROSCOPY Left 10/04/2015   Procedure: LEFT KNEE ARTHROSCOPY WITH LATERAL RETINACULAR RELEASE MEDIAL PLICA INCISION;  Surgeon: Jodi Geralds, MD;  Location: [facility] OR;  Service: Orthopedics;  Laterality: Left;   LEFT HEART CATH AND CORONARY ANGIOGRAPHY N/A 11/02/2017   Procedure: LEFT HEART CATH AND CORONARY ANGIOGRAPHY;  Surgeon: Yates Decamp, MD;  Location: [facility] INVASIVE CV LAB;  Service: Cardiovascular;  Laterality: N/A;   LEFT HEART CATHETERIZATION WITH CORONARY ANGIOGRAM N/A 09/17/2013   Procedure: LEFT HEART CATHETERIZATION WITH CORONARY ANGIOGRAM;  Surgeon: Peter M Swaziland, MD;  Location: [facility] CATH LAB;  Service: Cardiovascular;  Laterality: N/A;   llithotripsy      TOOTH EXTRACTION     UPPER GI ENDOSCOPY  05/26/2014   Procedure: UPPER GI ENDOSCOPY;  Surgeon: Atilano Ina, MD;  Location: [facility] ORS;  Service:  General;;   Social History   Socioeconomic History   Marital status: Married    Spouse name: Not on file   Number of children: Not on file   Years of education: Not on file   Highest education level: Not on file  Occupational History   Not on file  Tobacco Use   Smoking status: Never   Smokeless tobacco: Never  Vaping Use   Vaping status: Never Used  Substance and Sexual Activity   Alcohol use: No   Drug use: No   Sexual activity: Yes    Partners: Female  Other Topics Concern   Not on file  Social History Narrative   Marital Status: Married      Children: 2, son with special needs (seizures, developmental delay, tube fed, confined to motorized chair) (age 76yo) and daughter age (42yo)      Occupation: Education officer, environmental / Runner, broadcasting/film/video on internet       For fun - computer and gaming   04/2017   Social Determinants of Health   Financial Resource Strain: Not on file  Food Insecurity: Low Risk  (01/30/2023)   Received from [facility]   Hunger Vital Sign    Worried About Running Out of Food in the Last Year: Never true    Ran Out of Food in the Last Year: Never true  Transportation Needs: Not on file (01/30/2023)  Physical Activity: Not on file  Stress: Not on file  Social Connections: Not on file   Family History  Problem Relation Age of Onset   Coronary artery disease Mother 79       MI   Diabetes Mother    Stroke Mother        ~2012-Deceased   Aneurysm Mother    Arthritis Mother        DDD   Heart disease Mother    Hypertension Father    Alcohol abuse Father    Gout Father    Depression Father    Dystonia Maternal Grandmother    Hypertension Maternal Grandmother    Heart disease Maternal Grandmother    Heart disease Maternal Grandfather        died of MI   COPD Paternal Grandmother    Alzheimer's disease Paternal Grandmother    Heart disease Paternal Grandmother        pacemaker   Heart disease Paternal Grandfather    Cancer Paternal Grandfather        prostate and  colon   Gallbladder disease Paternal Grandfather    Allergies  Allergen Reactions   Lamictal [Lamotrigine] Other (See Comments)    INCREASED LFTs   Tegretol [Carbamazepine] Other (See Comments)    INCREASED LFTS   Sulfonamide Derivatives Hives   Influenza Vaccines    Nsaids     Avoids due to gastric bypass surgergy   Prior to Admission medications   Medication Sig Start Date End Date Taking? Authorizing Provider  albuterol (VENTOLIN HFA) 108 (90 Base) MCG/ACT inhaler Inhale 2 puffs into the lungs every 6 (six) hours as needed for wheezing or shortness of breath.   Yes [provider]  allopurinol (ZYLOPRIM) 300 MG tablet Take 1 tablet (300 mg total) by mouth daily. 05/29/18  Yes Tysinger, Kermit Balo, PA-C  Ascorbic Acid (VITAMIN C) 1000 MG tablet Take 1,000 mg by mouth every evening.   Yes [provider]  aspirin 81 MG chewable tablet Chew 81 mg by mouth daily. 10/12/20  Yes [provider]  buPROPion (WELLBUTRIN XL) 300 MG 24 hr tablet Take 300 mg by mouth daily.   Yes [provider]  busPIRone (BUSPAR) 30 MG tablet Take 30 mg by mouth 2 (two) times daily.   Yes [provider]  Calcium Citrate 250 MG TABS Take 250 mg by mouth daily. 04/07/20  Yes [provider]  carvedilol (COREG) 3.125 MG tablet TAKE 1 TABLET BY MOUTH TWICE DAILY WITH A MEAL 04/15/18  Yes Tysinger, Kermit Balo, PA-C  cetirizine (ZYRTEC) 10 MG tablet Take 10 mg by mouth daily.   Yes [provider]  cholecalciferol (VITAMIN D) 1000 units tablet Take 1,000 Units by mouth daily.   Yes [provider]  diphenhydrAMINE (BENADRYL) 25 MG tablet Take 25 mg by mouth at bedtime as needed for itching. 10/03/19  Yes [provider]  divalproex (DEPAKOTE ER) 500 MG 24 hr tablet Take 500 mg by mouth in the morning, at noon, and at bedtime. 01/26/22  Yes [provider]  escitalopram (LEXAPRO) 20 MG tablet Take 20 mg by mouth daily.   Yes [provider]  Ferrous Sulfate (IRON PO) Take 1 capsule by mouth daily. 04/07/20  Yes [provider]  furosemide (LASIX) 20 MG tablet Take 20 mg by mouth daily as needed for fluid. 07/21/22  Yes [provider]  isosorbide mononitrate (IMDUR) 30 MG 24 hr tablet Take 30 mg by mouth  daily. 12/27/21 12/18/23 Yes [provider]  lisdexamfetamine (VYVANSE) 60 MG capsule Take 60 mg by mouth daily.   Yes [provider]  lisinopril-hydrochlorothiazide (ZESTORETIC) 10-12.5 MG tablet Take 1 tablet by mouth daily. 07/21/22  Yes [provider]  meclizine (ANTIVERT) 25 MG tablet Take 25 mg by mouth 3 (three) times daily as needed for dizziness.   Yes [provider]  methocarbamol (ROBAXIN) 750 MG tablet Take 750 mg by mouth 3 (three) times daily. 03/01/18  Yes [provider]  nitroGLYCERIN (NITROSTAT) 0.4 MG SL tablet Place 0.4 mg under the tongue every 5 (five) minutes as needed for chest pain.   Yes [provider]  ondansetron (ZOFRAN) 8 MG tablet Take 8 mg by mouth every 8 (eight) hours as needed for nausea or vomiting.   Yes [provider]  pantoprazole (PROTONIX) 20 MG tablet Take 20 mg by mouth daily.   Yes [provider]  pravastatin (PRAVACHOL) 40 MG tablet TAKE 1 TABLET BY MOUTH EVERY EVENING 01/16/18  Yes Tysinger, Kermit Balo, PA-C  pregabalin (LYRICA) 100 MG capsule Take 100 mg by mouth 3 (three) times daily.   Yes [provider]  traMADol (ULTRAM) 50 MG tablet Take 1 tablet (50 mg total) by mouth every 8 (eight) hours as needed. Patient taking differently: Take 50 mg by mouth at bedtime as needed for moderate pain. 06/21/17  Yes Hyatt, Max T, DPM  traMADol (ULTRAM-ER) 100 MG 24 hr tablet Take 100 mg by mouth in the morning. 02/04/23  Yes [provider]  UBRELVY 100 MG TABS Take 100 mg by mouth as needed (migraine). 02/04/23  Yes [provider]  Zinc 50 MG TABS Take 50 mg by mouth every  morning.   Yes [provider]  busPIRone (BUSPAR) 15 MG tablet TAKE 1 TABLET BY MOUTH TWICE DAILY Patient not taking: Reported on 02/23/2023 05/03/18   Tysinger, Kermit Balo, PA-C  gabapentin (NEURONTIN) 300 MG capsule Take 1 capsule (300 mg total) by mouth 3 (three) times daily. Patient not taking: Reported on 02/23/2023 05/13/18   Tysinger, Kermit Balo, PA-C  HYDROcodone-acetaminophen (NORCO/VICODIN) 5-325 MG tablet Take 1 tablet by mouth every 6 (six) hours as needed for moderate pain. Patient not taking: Reported on 02/23/2023 04/18/18   Hyatt, Max T, DPM  methylPREDNISolone (MEDROL DOSEPAK) 4 MG TBPK tablet 6 day dose pack - take as directed Patient not taking: Reported on 02/23/2023 04/18/18   Hyatt, Max T, DPM  omeprazole (PRILOSEC) 40 MG capsule Take 1 capsule (40 mg total) by mouth daily. Patient not taking: Reported on 02/23/2023 05/29/18   Tysinger, Kermit Balo, PA-C  ondansetron (ZOFRAN) 4 MG tablet Take 1 tablet (4 mg total) by mouth every 8 (eight) hours as needed for nausea or vomiting. Patient not taking: Reported on 02/23/2023 10/01/17   Tysinger, Kermit Balo, PA-C  Semaglutide-Weight Management 0.25 MG/0.5ML SOAJ Inject 0.25 mg into the skin once a week. 01/19/23   Royston Bake, PA-C     Positive ROS: None  All other systems have been reviewed and were otherwise negative with the exception of those mentioned in the HPI and as above.  Physical Exam: There were no vitals filed for this visit.  General: Alert, no acute distress Cardiovascular: No pedal edema Respiratory: No cyanosis, no use of accessory musculature GI: No organomegaly, abdomen is soft and non-tender Skin: No lesions in the area of chief complaint Neurologic: Sensation intact distally Psychiatric: Patient is competent for consent with normal mood and  affect Lymphatic: No axillary or cervical lymphadenopathy  MUSCULOSKELETAL: Left knee: Painful range of motion.  Limited range of motion.  No instability.  Trace effusion.   No erythema or warmth.  Assessment/Plan: LEFT KNEE CHONDROMALACIA Plan for Procedure(s): LEFT ARTHROSCOPY KNEE WITH DEBRIDEMENT AND CHONDROPLASTY  The risks benefits and alternatives were discussed with the patient including but not limited to the risks of nonoperative treatment, versus surgical intervention including infection, bleeding, nerve injury, malunion, nonunion, hardware prominence, hardware failure, need for hardware removal, blood clots, cardiopulmonary complications, morbidity, mortality, among others, and they were willing to proceed.  Predicted outcome is good, although there will be at least a six to nine month expected recovery.  Harvie Junior, MD 03/12/2023 11:15 AM

## 2023-03-12 NOTE — Anesthesia Procedure Notes (Signed)
Procedure Name: Intubation Date/Time: 03/12/2023 4:07 PM  Performed by: Deri Fuelling, CRNAPre-anesthesia Checklist: Patient identified, Emergency Drugs available, Suction available and Patient being monitored Patient Re-evaluated:Patient Re-evaluated prior to induction Oxygen Delivery Method: Circle system utilized Preoxygenation: Pre-oxygenation with 100% oxygen Induction Type: IV induction Ventilation: Mask ventilation without difficulty Laryngoscope Size: Glidescope and 4 Grade View: Grade I Tube type: Oral Tube size: 7.5 mm Number of attempts: 1 Airway Equipment and Method: Stylet and Oral airway Placement Confirmation: ETT inserted through vocal cords under direct vision, positive ETCO2 and breath sounds checked- equal and bilateral Secured at: 23 cm Tube secured with: Tape Dental Injury: Teeth and Oropharynx as per pre-operative assessment

## 2023-03-12 NOTE — Op Note (Signed)
NAMEHARLESS, LUMBARD MEDICAL RECORD NO: [id number] ACCOUNT NO: [id number] DATE OF BIRTH: 07-09-1970 FACILITY: [facility] LOCATION: [facility] PHYSICIAN: Harvie Junior, MD  Operative Report   DATE OF PROCEDURE: 03/12/2023  PREOPERATIVE DIAGNOSIS:  Chondromalacia of the knee with intractable pain and morbid obesity making total knee replacement not appropriate.  POSTOPERATIVE DIAGNOSES: 1.  Chondromalacia of the knee with intractable pain and morbid obesity making total knee replacement not appropriate. 2.  Loose body, left knee.  SURGEON:  Harvie Junior, MD  ASSISTANT:  Gus Puma, PA-C.  PRINCIPAL PROCEDURE: 1.  Operative knee arthroscopy with chondroplasty, medial, lateral, and patellofemoral. 2.  Arthroscopic removal of loose body, left knee.  BRIEF HISTORY: The patient is a 52 year old male with a long history of significant complaints of left knee pain.  He has been treated conservatively for a prolonged period of time. He failed injection therapy, activity modification and physical therapy.   MRI was obtained, which showed no meniscal tears, but did show significant chondromalacia.  He is morbidly obese, not a candidate for total knee replacement.  We felt as a temporizing measure arthroscopy may be beneficial and was brought to the  operating room for this procedure.  DESCRIPTION OF PROCEDURE:  The patient was brought to the operating room.  After adequate anesthesia was obtained with a general anesthetic, the patient was placed supine on operating table.  Left leg was prepped and draped in the usual sterile fashion.   Following this, routine arthroscopic examination of knee revealed there was significant chondromalacia patellofemoral joint.  This was debrided back to a smooth and stable rim.  Attention was turned to the patellofemoral trochlea and on that medial  side, he had some grade IV changes.  We debrided the trochlea back to a smooth stable rim of articular cartilage down to  bleeding bone where necessary.  Attention was then turned to the medial compartment where the medial meniscus was within normal  limits.  A large osteocartilaginous loose body was encountered in the medial compartment.  This was removed and the remaining articular surface was debrided down to a smooth and stable rim of articular cartilage down to bleeding bone where necessary.   Attention was then turned to the ACL, normal.  Attention was turned to the lateral side, there was some chondromalacia of the lateral tibial plateau was not dramatic.  We did clean that up.  Lateral femoral condyle looked good.  Lateral meniscus within  normal limits.  At this point, the knee was copiously and thoroughly irrigated and suctioned dry.  The arthroscopic portals were closed with a bandage.  30 mL of 0.25% Marcaine was instilled in the knee for postoperative anesthesia.  The patient was  taken to recovery and was noted to be in the satisfactory condition after sterile compressive dressing was applied.   PUS D: 03/12/2023 4:14:58 pm T: 03/12/2023 6:41:00 pm  JOB: [id number]/ [id number]

## 2023-03-12 NOTE — Transfer of Care (Signed)
Immediate Anesthesia Transfer of Care Note  Patient: Justin [name]  Procedure(s) Performed: LEFT ARTHROSCOPY KNEE WITH DEBRIDEMENT AND CHONDROPLASTY MEDIAL, LATERAL, AND PATELLA FEMORAL (Left: Knee)  Patient Location: PACU  Anesthesia Type:General  Level of Consciousness: awake and alert   Airway & Oxygen Therapy: Patient Spontanous Breathing and Patient connected to face mask oxygen  Post-op Assessment: Report given to RN and Post -op Vital signs reviewed and stable  Post vital signs: Reviewed and stable  Last Vitals:  Vitals Value Taken Time  BP 122/87 03/12/23 1601  Temp    Pulse 92 03/12/23 1604  Resp 25 03/12/23 1604  SpO2 89 % 03/12/23 1604  Vitals shown include unfiled device data.  Last Pain:  Vitals:   03/12/23 1133  TempSrc: Oral  PainSc:       Patients Stated Pain Goal: 4 (03/12/23 1130)  Complications: No notable events documented.

## 2023-03-12 NOTE — Anesthesia Preprocedure Evaluation (Addendum)
Anesthesia Evaluation  Patient identified by MRN, date of birth, ID band Patient awake    Reviewed: Allergy & Precautions, NPO status , Patient's Chart, lab work & pertinent test results  History of Anesthesia Complications Negative for: history of anesthetic complications  Airway Mallampati: III  TM Distance: >3 FB Neck ROM: Full    Dental  (+) Poor Dentition, Missing, Dental Advisory Given   Pulmonary sleep apnea and Continuous Positive Airway Pressure Ventilation    breath sounds clear to auscultation       Cardiovascular hypertension, Pt. on medications and Pt. on home beta blockers Normal cardiovascular exam Rhythm:Regular Rate:Normal     Neuro/Psych  Headaches PSYCHIATRIC DISORDERS Anxiety Depression Bipolar Disorder      GI/Hepatic negative GI ROS, Neg liver ROS,,,  Endo/Other    Morbid obesity  Renal/GU Renal disease     Musculoskeletal  (+) Arthritis ,    Abdominal  (+) + obese  Peds  Hematology negative hematology ROS (+)   Anesthesia Other Findings   Reproductive/Obstetrics                             Anesthesia Physical Anesthesia Plan  ASA: 3  Anesthesia Plan: General   Post-op Pain Management: Ofirmev IV (intra-op)*   Induction: Intravenous  PONV Risk Score and Plan: Ondansetron, Dexamethasone, Treatment may vary due to age or medical condition and Midazolam  Airway Management Planned: Oral ETT  Additional Equipment: None  Intra-op Plan:   Post-operative Plan: Extubation in OR  Informed Consent: I have reviewed the patients History and Physical, chart, labs and discussed the procedure including the risks, benefits and alternatives for the proposed anesthesia with the patient or authorized representative who has indicated his/her understanding and acceptance.     Dental advisory given  Plan Discussed with: CRNA  Anesthesia Plan Comments:          Anesthesia Quick Evaluation

## 2023-03-13 ENCOUNTER — Encounter (HOSPITAL_COMMUNITY): Payer: Self-pay | Admitting: Orthopedic Surgery

## 2023-03-13 ENCOUNTER — Other Ambulatory Visit: Payer: Self-pay

## 2023-03-16 ENCOUNTER — Other Ambulatory Visit (HOSPITAL_COMMUNITY): Payer: Self-pay

## 2023-03-21 DIAGNOSIS — K219 Gastro-esophageal reflux disease without esophagitis: Secondary | ICD-10-CM | POA: Insufficient documentation

## 2023-03-23 ENCOUNTER — Other Ambulatory Visit (HOSPITAL_COMMUNITY): Payer: Self-pay

## 2023-03-23 MED ORDER — WEGOVY 0.5 MG/0.5ML ~~LOC~~ SOAJ
0.5000 mg | SUBCUTANEOUS | 0 refills | Status: DC
  Filled 2023-03-23: qty 2, 28d supply, fill #0

## 2023-03-24 ENCOUNTER — Other Ambulatory Visit (HOSPITAL_COMMUNITY): Payer: Self-pay

## 2023-03-27 NOTE — Therapy (Signed)
OUTPATIENT PHYSICAL THERAPY LOWER EXTREMITY EVALUATION   Patient Name: Justin [name] MRN: [id number] DOB:28-Jan-1971, 52 y.o., male Today's Date: 03/28/2023  END OF SESSION:  PT End of Session - 03/28/23 1101     Visit Number 1    Number of Visits 13    Date for PT Re-Evaluation 05/09/23    Authorization Type medicaid [employer]    Authorization Time Period Auth after 27 visits per appt notes    PT Start Time 1102    PT Stop Time 1140    PT Time Calculation (min) 38 min    Activity Tolerance Patient tolerated treatment well;No increased pain    Behavior During Therapy WFL for tasks assessed/performed             Past Medical History:  Diagnosis Date   Anxiety    Arthritis    Depression    Gout    History of kidney stones    Hypertension    Kidney stones age 45 to 25   throughout life several times, on allopurinol prophylaxis   Migraine headache    Neuropathy    mainly left leg , but bilat legs s/p rapid weight loss and gastric bypass surgery   Obese    OSA on CPAP    uses cpap   Tremor    Past Surgical History:  Procedure Laterality Date   CHOLECYSTECTOMY N/A 03/13/2016   Procedure: LAPAROSCOPIC CHOLECYSTECTOMY WITH INTRAOPERATIVE CHOLANGIOGRAM;  Surgeon: Gaynelle Adu, MD;  Location: [facility];  Service: General;  Laterality: N/A;   GASTRIC ROUX-EN-Y N/A 05/26/2014   Procedure: LAPAROSCOPIC ROUX-EN-Y GASTRIC BYPASS WITH UPPER ENDOSCOPY;  Surgeon: Atilano Ina, MD;  Location: [facility];  Service: General;  Laterality: N/A;   KNEE ARTHROSCOPY Left 10/04/2015   Procedure: LEFT KNEE ARTHROSCOPY WITH LATERAL RETINACULAR RELEASE MEDIAL PLICA INCISION;  Surgeon: Jodi Geralds, MD;  Location: [facility];  Service: Orthopedics;  Laterality: Left;   KNEE ARTHROSCOPY Left 03/12/2023   Procedure: LEFT ARTHROSCOPY KNEE WITH DEBRIDEMENT AND CHONDROPLASTY MEDIAL, LATERAL, AND PATELLA FEMORAL;  Surgeon: Jodi Geralds, MD;  Location: [facility];  Service: Orthopedics;  Laterality: Left;   LEFT  HEART CATH AND CORONARY ANGIOGRAPHY N/A 11/02/2017   Procedure: LEFT HEART CATH AND CORONARY ANGIOGRAPHY;  Surgeon: Yates Decamp, MD;  Location: [facility];  Service: Cardiovascular;  Laterality: N/A;   LEFT HEART CATHETERIZATION WITH CORONARY ANGIOGRAM N/A 09/17/2013   Procedure: LEFT HEART CATHETERIZATION WITH CORONARY ANGIOGRAM;  Surgeon: Peter M Swaziland, MD;  Location: [facility] CATH [facility];  Service: Cardiovascular;  Laterality: N/A;   llithotripsy      TOOTH EXTRACTION     UPPER GI ENDOSCOPY  05/26/2014   Procedure: UPPER GI ENDOSCOPY;  Surgeon: Atilano Ina, MD;  Location: [facility];  Service: General;;   Patient Active Problem List   Diagnosis Date Noted   Chondromalacia, left knee 03/12/2023   Chondral loose body of left knee joint 03/12/2023   Fever 03/20/2018   Cough 03/20/2018   Body aches 03/20/2018   Allergic conjunctivitis of both eyes 10/01/2017   Allergic rhinitis due to pollen 10/01/2017   Otalgia 10/01/2017   Left inguinal pain 08/03/2017   Encounter for health maintenance examination in adult 05/03/2017   History of kidney stones 05/03/2017   Neuropathy 04/13/2017   Sore throat 04/13/2017   History of gastric bypass 04/13/2017   Rhinitis, allergic 09/27/2016   Left leg swelling 03/22/2016   Chondromalacia, patella, left 10/04/2015   Synovial plica of left knee 10/04/2015  Left thigh pain 08/09/2015   Rapid heart beat 12/09/2014   Left knee pain 12/09/2014   Morbid obesity with BMI of 50.0-59.9, adult (HCC) 02/04/2014   Chest pain 12/18/2011   Upper respiratory tract infection 03/28/2011   Sleep apnea 08/08/2010   VENTRAL HERNIA 04/06/2010   Anxiety state 10/19/2008   Gout 08/03/2008   HYPERTENSION, BENIGN 08/03/2008    PCP: Judd Lien, PA-C  REFERRING PROVIDER: Jodi Geralds, MD  REFERRING DIAG: S/P Left Knee Arthroscopy  THERAPY DIAG:  Left knee pain, unspecified chronicity  Stiffness of left knee, not elsewhere classified  Other abnormalities of  gait and mobility  Localized edema  Rationale for Evaluation and Treatment: Rehabilitation  ONSET DATE: 03/12/23 L knee arthroscopy  SUBJECTIVE:   SUBJECTIVE STATEMENT: Pt endorses longstanding history of knee pain w/ prior procedures, also endorses mobility limitations with low back pain. States since surgery knee pain has improved some, although continues to have fair amount of swelling. States he had some "blood built up" in his knee that required drainage about a week after surgery. He also endorses a baker's cyst which he feels contributes to his pain some. Has some infrequent episodes of giving out ("every once in a while") that tends to occur mostly when walking further distances, accompanied by muscular fatigue. Has some popping/crunching in knee, occasionally painful; was occurring prior to surgery. Has longstanding history of LLE N/T which he states feels a bit more prominent since surgery.   PERTINENT HISTORY: HTN, sleep apnea, neuropathy, gout, anxiety/depression, migraines  PAIN:  Are you having pain: 1-2/10 Location/description: anterior knee,  Best-worst over past week: 1-6/10  - aggravating factors: WB, walking in grocery store, transfers - Easing factors: medication, ice    PRECAUTIONS: none (s/p L knee scope 03/12/23)   WEIGHT BEARING RESTRICTIONS: No  FALLS:  Has patient fallen in last 6 months? Yes. Number of falls 2 falls prior to surgery  LIVING ENVIRONMENT: [employer] [place] - 1 level home, 1 STE vs ramp Lives w/ wife, daughter in college for LPN, son who has special needs (nonverbal, uses wheelchair per pt report) Helps caring for son, doesn't do transfers due to back history Cane, walker, grab rails  OCCUPATION: minister  PLOF: Independent - with modifications given back pain issues and knee issues  PATIENT GOALS: be able to walk more, be more active and be able to walk around the block in his neighborhood  NEXT MD VISIT: 04/03/23 with  surgery  OBJECTIVE: (objective measures completed at initial evaluation unless otherwise dated)   DIAGNOSTIC FINDINGS: s/p L knee scope 03/12/23  PATIENT SURVEYS:  FOTO 47 current, 52 predicted  COGNITION: Overall cognitive status: Within functional limits for tasks assessed     SENSATION: Pt reports hx chronic n/t in LLE from lumbar issues, increased since surgery, not formally assessed but appears grossly intact to LT with palpation exam  EDEMA/OBSERVATION:  Gross edema about knee joint L side, not formally measured. Is observed to have area of mild bruising about distal/lateral knee - pt indicates this is where surgeon had to drain buildup of blood after surgery. He describes this bruising and edema both as improving with time  PALPATION: Concordant tenderness L patellar tendon, lateral and medial quad, distal adductors  LOWER EXTREMITY ROM:     Active  Right eval Left eval  Hip flexion    Hip extension    Hip internal rotation    Hip external rotation    Knee extension  Lacking 10 deg passively with  discomfort  Knee flexion  88 deg actively   (Blank rows = not tested) (Key: WFL = within functional limits not formally assessed, * = concordant pain, s = stiffness/stretching sensation, NT = not tested)  Comments:    LOWER EXTREMITY MMT:    MMT Right eval Left eval  Hip flexion    Hip abduction (modified sitting)    Hip internal rotation    Hip external rotation    Knee flexion    Knee extension    Ankle dorsiflexion     (Blank rows = not tested) (Key: WFL = within functional limits not formally assessed, * = concordant pain, s = stiffness/stretching sensation, NT = not tested)  Comments: deferred given pain/swelling and acuity of surgery  FUNCTIONAL TESTS:  TUG: 13.57sec w SPC, significantly reduced eccentric control back into chair, reduced WB through surgical limb with ascent/descent   GAIT: Distance walked: within clinic Assistive device utilized: Single  point cane Level of assistance: Modified independence Comments: antalgic gait LLE, wide BOS   TODAY'S TREATMENT:                                                                                                                              [facility] Adult PT Treatment:                                                DATE: 03/28/23 Therapeutic Exercise: LAQ x10 LLE Tailgate knee flexion AAROM x10 LLE HEP handout + education    PATIENT EDUCATION:  Education details: Pt education on PT impairments, prognosis, and POC. Informed consent. Rationale for interventions, safe/appropriate HEP performance Person educated: Patient Education method: Explanation, Demonstration, Tactile cues, Verbal cues, and Handouts Education comprehension: verbalized understanding, returned demonstration, verbal cues required, tactile cues required, and needs further education    HOME EXERCISE PROGRAM: Access Code: [email or web address] URL: [email or web address] Date: 03/28/2023 Prepared by: Fransisco Hertz  Exercises - Seated Long Arc Quad  - 2-3 x daily - 7 x weekly - 1 sets - 8-10 reps - Seated Heel Slide  - 2-3 x daily - 7 x weekly - 1 sets - 8-10 reps  ASSESSMENT:  CLINICAL IMPRESSION: Patient is a pleasant 52 y.o. gentleman who was seen today for physical therapy evaluation and treatment for L knee pain s/p arthroscopy on 03/12/23. Pt describes limitations in ADLs and mobility as expected post operatively. On exam pt demonstrates limitations in knee mobility as expected, as well as altered kinematics with transfers/gait. TUG time is indicative of fall risk as noted below. Tolerates HEP and exam well overall, no adverse events. Recommend trial of skilled PT to address aforementioned deficits with aim of improving functional tolerance and reducing pain with typical activities. Pt departs today's session in no acute distress, all voiced concerns/questions addressed appropriately from PT perspective.  OBJECTIVE  IMPAIRMENTS: Abnormal gait, decreased activity tolerance, decreased balance, decreased endurance, decreased mobility, difficulty walking, decreased ROM, decreased strength, improper body mechanics, and pain.   ACTIVITY LIMITATIONS: carrying, lifting, standing, squatting, stairs, transfers, and locomotion level  PARTICIPATION LIMITATIONS: meal prep, cleaning, laundry, shopping, and community activity  PERSONAL FACTORS: Time since onset of injury/illness/exacerbation and 3+ comorbidities: HTN, sleep apnea, neuropathy, gout, anxiety/depression, migraines  are also affecting patient's functional outcome.   REHAB POTENTIAL: Good  CLINICAL DECISION MAKING: Stable/uncomplicated  EVALUATION COMPLEXITY: Low   GOALS: Goals reviewed with patient? Yes  SHORT TERM GOALS: Target date: 04/18/2023   Pt will demonstrate appropriate understanding and performance of initially prescribed HEP in order to facilitate improved independence with management of symptoms.  Baseline: HEP provided on eval Goal status: INITIAL   2. Pt will score greater than or equal to 50 on FOTO in order to demonstrate improved perception of function due to symptoms.  Baseline: 47  Goal status: INITIAL    LONG TERM GOALS: Target date: 05/09/2023 Pt will score 52 or greater on FOTO in order to demonstrate improved perception of function due to symptoms.  Baseline: 47 Goal status: INITIAL  2.  Pt will demonstrate at least 0-110 degrees of L knee AROM in order to facilitate improved tolerance to functional movements such as squatting/stairs.  Baseline: see ROM chart above Goal status: INITIAL  3.  Pt will demonstrate grossly symmetrical knee strength via MMT in order to facilitate improved functional strength. Baseline: deferred given pain/swelling and proximity to surgery Goal status: INITIAL  4.  Pt will report at least 50% decrease in overall pain levels in past week in order to facilitate improved tolerance to basic  ADLs/mobility.   Baseline: 1-6/10  Goal status: INITIAL    5. Pt will be able to perform TUG in less than or equal to 11sec sec in order to indicate reduced risk of falling (cutoff score for fall risk 13.5 sec in community dwelling older adults per [facility] et al, 2000)  Baseline: 13.57sec with SPC, altered mechanics  Goal status: INITIAL     6. Pt will demonstrate appropriate performance of final prescribed HEP in order to facilitate improved self-management of symptoms post-discharge.   Baseline: initial HEP prescribed  Goal status: INITIAL     PLAN:  PT FREQUENCY: 2x/week  PT DURATION: 6 weeks  PLANNED INTERVENTIONS: Therapeutic exercises, Therapeutic activity, Neuromuscular re-education, Balance training, Gait training, Patient/Family education, Self Care, Joint mobilization, Stair training, Cryotherapy, Moist heat, Taping, Manual therapy, and Re-evaluation  PLAN FOR NEXT SESSION: Review/update HEP PRN. Work on [employer] exercises as appropriate with emphasis on quad activation, knee mobility in open and closed chain, functional mobility. Symptom modification strategies as indicated/appropriate.    Ashley Murrain PT, DPT 03/28/2023 2:11 PM

## 2023-03-28 ENCOUNTER — Encounter: Payer: Self-pay | Admitting: Physical Therapy

## 2023-03-28 ENCOUNTER — Ambulatory Visit: Payer: Medicaid Other | Attending: Orthopedic Surgery | Admitting: Physical Therapy

## 2023-03-28 DIAGNOSIS — M25562 Pain in left knee: Secondary | ICD-10-CM | POA: Diagnosis present

## 2023-03-28 DIAGNOSIS — R600 Localized edema: Secondary | ICD-10-CM | POA: Diagnosis present

## 2023-03-28 DIAGNOSIS — M25662 Stiffness of left knee, not elsewhere classified: Secondary | ICD-10-CM | POA: Diagnosis present

## 2023-03-28 DIAGNOSIS — R2689 Other abnormalities of gait and mobility: Secondary | ICD-10-CM | POA: Diagnosis present

## 2023-03-29 ENCOUNTER — Other Ambulatory Visit (HOSPITAL_COMMUNITY): Payer: Self-pay

## 2023-04-04 ENCOUNTER — Ambulatory Visit: Payer: Medicaid Other | Admitting: Physical Therapy

## 2023-04-04 ENCOUNTER — Encounter: Payer: Self-pay | Admitting: Physical Therapy

## 2023-04-04 DIAGNOSIS — M25562 Pain in left knee: Secondary | ICD-10-CM

## 2023-04-04 DIAGNOSIS — M25662 Stiffness of left knee, not elsewhere classified: Secondary | ICD-10-CM

## 2023-04-04 DIAGNOSIS — R2689 Other abnormalities of gait and mobility: Secondary | ICD-10-CM

## 2023-04-04 DIAGNOSIS — R600 Localized edema: Secondary | ICD-10-CM

## 2023-04-04 NOTE — Therapy (Signed)
Daily Note   Patient Name: Justin [name] MRN: [id number] DOB:[birth year], 52 y.o., male Today's Date: 04/04/2023  END OF SESSION:  PT End of Session - 04/04/23 1129     Visit Number 2    Number of Visits 13    Date for PT Re-Evaluation 05/09/23    Authorization Type medicaid [email or web address]    Authorization Time Period Auth after 27 visits per appt notes    PT Start Time 1130    PT Stop Time 1212    PT Time Calculation (min) 42 min    Activity Tolerance Patient tolerated treatment well;No increased pain    Behavior During Therapy WFL for tasks assessed/performed             Past Medical History:  Diagnosis Date   Anxiety    Arthritis    Depression    Gout    History of kidney stones    Hypertension    Kidney stones age 71 to 89   throughout life several times, on allopurinol prophylaxis   Migraine headache    Neuropathy    mainly left leg , but bilat legs s/p rapid weight loss and gastric bypass surgery   Obese    OSA on CPAP    uses cpap   Tremor    Past Surgical History:  Procedure Laterality Date   CHOLECYSTECTOMY N/A 03/13/2016   Procedure: LAPAROSCOPIC CHOLECYSTECTOMY WITH INTRAOPERATIVE CHOLANGIOGRAM;  Surgeon: Gaynelle Adu, MD;  Location: [facility];  Service: General;  Laterality: N/A;   GASTRIC ROUX-EN-Y N/A 05/26/2014   Procedure: LAPAROSCOPIC ROUX-EN-Y GASTRIC BYPASS WITH UPPER ENDOSCOPY;  Surgeon: Atilano Ina, MD;  Location: [facility];  Service: General;  Laterality: N/A;   KNEE ARTHROSCOPY Left 10/04/2015   Procedure: LEFT KNEE ARTHROSCOPY WITH LATERAL RETINACULAR RELEASE MEDIAL PLICA INCISION;  Surgeon: Jodi Geralds, MD;  Location: [facility];  Service: Orthopedics;  Laterality: Left;   KNEE ARTHROSCOPY Left 03/12/2023   Procedure: LEFT ARTHROSCOPY KNEE WITH DEBRIDEMENT AND CHONDROPLASTY MEDIAL, LATERAL, AND PATELLA FEMORAL;  Surgeon: Jodi Geralds, MD;  Location: [facility];  Service: Orthopedics;  Laterality: Left;   LEFT HEART CATH AND CORONARY ANGIOGRAPHY N/A  11/02/2017   Procedure: LEFT HEART CATH AND CORONARY ANGIOGRAPHY;  Surgeon: Yates Decamp, MD;  Location: [facility];  Service: Cardiovascular;  Laterality: N/A;   LEFT HEART CATHETERIZATION WITH CORONARY ANGIOGRAM N/A 09/17/2013   Procedure: LEFT HEART CATHETERIZATION WITH CORONARY ANGIOGRAM;  Surgeon: Peter M Swaziland, MD;  Location: [facility] CATH LAB;  Service: Cardiovascular;  Laterality: N/A;   llithotripsy      TOOTH EXTRACTION     UPPER GI ENDOSCOPY  05/26/2014   Procedure: UPPER GI ENDOSCOPY;  Surgeon: Atilano Ina, MD;  Location: [facility];  Service: General;;   Patient Active Problem List   Diagnosis Date Noted   Chondromalacia, left knee 03/12/2023   Chondral loose body of left knee joint 03/12/2023   Fever 03/20/2018   Cough 03/20/2018   Body aches 03/20/2018   Allergic conjunctivitis of both eyes 10/01/2017   Allergic rhinitis due to pollen 10/01/2017   Otalgia 10/01/2017   Left inguinal pain 08/03/2017   Encounter for health maintenance examination in adult 05/03/2017   History of kidney stones 05/03/2017   Neuropathy 04/13/2017   Sore throat 04/13/2017   History of gastric bypass 04/13/2017   Rhinitis, allergic 09/27/2016   Left leg swelling 03/22/2016   Chondromalacia, patella, left 10/04/2015   Synovial plica of left knee 10/04/2015   Left thigh  pain 08/09/2015   Rapid heart beat 12/09/2014   Left knee pain 12/09/2014   Morbid obesity with BMI of 50.0-59.9, adult (HCC) 02/04/2014   Chest pain 12/18/2011   Upper respiratory tract infection 03/28/2011   Sleep apnea 08/08/2010   VENTRAL HERNIA 04/06/2010   Anxiety state 10/19/2008   Gout 08/03/2008   HYPERTENSION, BENIGN 08/03/2008    PCP: Judd Lien, PA-C  REFERRING PROVIDER: Jodi Geralds, MD  REFERRING DIAG: S/P Left Knee Arthroscopy  THERAPY DIAG:  Left knee pain, unspecified chronicity  Stiffness of left knee, not elsewhere classified  Other abnormalities of gait and mobility  Localized  edema  Rationale for Evaluation and Treatment: Rehabilitation  ONSET DATE: 03/12/23 L knee arthroscopy  SUBJECTIVE:   SUBJECTIVE STATEMENT: Pt reports that his knee is is getting better.  He feels his knee is improved since the surgery.   PERTINENT HISTORY: HTN, sleep apnea, neuropathy, gout, anxiety/depression, migraines  PAIN:  Are you having pain: 2-3/10 Location/description: anterior knee,  Best-worst over past week: 1-6/10  - aggravating factors: WB, walking in grocery store, transfers - Easing factors: medication, ice    PRECAUTIONS: none (s/p L knee scope 03/12/23)   WEIGHT BEARING RESTRICTIONS: No  FALLS:  Has patient fallen in last 6 months? Yes. Number of falls 2 falls prior to surgery  LIVING ENVIRONMENT: [employer] - 1 level home, 1 STE vs ramp Lives w/ wife, daughter in college for LPN, son who has special needs (nonverbal, uses wheelchair per pt report) Helps caring for son, doesn't do transfers due to back history Cane, walker, grab rails  OCCUPATION: minister  PLOF: Independent - with modifications given back pain issues and knee issues  PATIENT GOALS: be able to walk more, be more active and be able to walk around the block in his neighborhood  NEXT MD VISIT: 04/03/23 with surgery  OBJECTIVE: (objective measures completed at initial evaluation unless otherwise dated)   DIAGNOSTIC FINDINGS: s/p L knee scope 03/12/23  PATIENT SURVEYS:  FOTO 47 current, 52 predicted  COGNITION: Overall cognitive status: Within functional limits for tasks assessed     SENSATION: Pt reports hx chronic n/t in LLE from lumbar issues, increased since surgery, not formally assessed but appears grossly intact to LT with palpation exam  EDEMA/OBSERVATION:  Gross edema about knee joint L side, not formally measured. Is observed to have area of mild bruising about distal/lateral knee - pt indicates this is where surgeon had to drain buildup of blood after surgery. He  describes this bruising and edema both as improving with time  PALPATION: Concordant tenderness L patellar tendon, lateral and medial quad, distal adductors  LOWER EXTREMITY ROM:     Active  Right eval Left eval Left  Hip flexion     Hip extension     Hip internal rotation     Hip external rotation     Knee extension  Lacking 10 deg passively with discomfort Near 0  Knee flexion  88 deg actively    (Blank rows = not tested) (Key: WFL = within functional limits not formally assessed, * = concordant pain, s = stiffness/stretching sensation, NT = not tested)  Comments:    LOWER EXTREMITY MMT:    MMT Right eval Left eval  Hip flexion    Hip abduction (modified sitting)    Hip internal rotation    Hip external rotation    Knee flexion    Knee extension    Ankle dorsiflexion     (Blank  rows = not tested) (Key: WFL = within functional limits not formally assessed, * = concordant pain, s = stiffness/stretching sensation, NT = not tested)  Comments: deferred given pain/swelling and acuity of surgery  FUNCTIONAL TESTS:  TUG: 13.57sec w SPC, significantly reduced eccentric control back into chair, reduced WB through surgical limb with ascent/descent   GAIT: Distance walked: within clinic Assistive device utilized: Single point cane Level of assistance: Modified independence Comments: antalgic gait LLE, wide BOS   TODAY'S TREATMENT:                                                                                                                              [facility] Adult PT Treatment: Therapeutic Exercise: nu-step L5 56m while taking subjective and planning session with patient Quad set - 5'' - 2x10 SLR - 2x10 - small arc SAQ - 2x10 - 3'' hold SAQ - 4x30'' hold Supine clam - GTB - 3x10 LAQ 3x10 LLE - 2# Tailgate knee flexion AAROM 2x10 LLE   PATIENT EDUCATION:  Education details: Pt education on PT impairments, prognosis, and POC. Informed consent. Rationale for  interventions, safe/appropriate HEP performance Person educated: Patient Education method: Explanation, Demonstration, Tactile cues, Verbal cues, and Handouts Education comprehension: verbalized understanding, returned demonstration, verbal cues required, tactile cues required, and needs further education    HOME EXERCISE PROGRAM: Access Code: [email or web address] URL: [email or web address] Date: 04/04/2023 Prepared by: Alphonzo Severance  Exercises - Seated Long Arc Quad  - 2-3 x daily - 7 x weekly - 1 sets - 8-10 reps - Seated Heel Slide  - 2-3 x daily - 7 x weekly - 1 sets - 8-10 reps - Supine Quad Set  - 3 x daily - 7 x weekly - 2 sets - 10 reps - 10 second hold - Small Range Straight Leg Raise  - 1 x daily - 7 x weekly - 3 sets - 10 reps  ASSESSMENT:  CLINICAL IMPRESSION: [place] tolerated session well with no adverse reaction.  He shows significant improvement in knee ext and is essentially symetrical at this point.  He is still lacking a few degrees of flexion compared to R with "tight" feeling reported at end range.  Generally does well with exercise with minimal increase in pain with exception of LAQ with 4# which was reduced to 2# d/t pain complaint;  2# well tolerated.  OBJECTIVE IMPAIRMENTS: Abnormal gait, decreased activity tolerance, decreased balance, decreased endurance, decreased mobility, difficulty walking, decreased ROM, decreased strength, improper body mechanics, and pain.   ACTIVITY LIMITATIONS: carrying, lifting, standing, squatting, stairs, transfers, and locomotion level  PARTICIPATION LIMITATIONS: meal prep, cleaning, laundry, shopping, and community activity  PERSONAL FACTORS: Time since onset of injury/illness/exacerbation and 3+ comorbidities: HTN, sleep apnea, neuropathy, gout, anxiety/depression, migraines  are also affecting patient's functional outcome.   REHAB POTENTIAL: Good  CLINICAL DECISION MAKING: Stable/uncomplicated  EVALUATION COMPLEXITY:  Low   GOALS: Goals reviewed with patient? Yes  SHORT TERM GOALS: Target date:  04/18/2023   Pt will demonstrate appropriate understanding and performance of initially prescribed HEP in order to facilitate improved independence with management of symptoms.  Baseline: HEP provided on eval Goal status: INITIAL   2. Pt will score greater than or equal to 50 on FOTO in order to demonstrate improved perception of function due to symptoms.  Baseline: 47  Goal status: INITIAL    LONG TERM GOALS: Target date: 05/09/2023 Pt will score 52 or greater on FOTO in order to demonstrate improved perception of function due to symptoms.  Baseline: 47 Goal status: INITIAL  2.  Pt will demonstrate at least 0-110 degrees of L knee AROM in order to facilitate improved tolerance to functional movements such as squatting/stairs.  Baseline: see ROM chart above Goal status: INITIAL  3.  Pt will demonstrate grossly symmetrical knee strength via MMT in order to facilitate improved functional strength. Baseline: deferred given pain/swelling and proximity to surgery Goal status: INITIAL  4.  Pt will report at least 50% decrease in overall pain levels in past week in order to facilitate improved tolerance to basic ADLs/mobility.   Baseline: 1-6/10  Goal status: INITIAL    5. Pt will be able to perform TUG in less than or equal to 11sec sec in order to indicate reduced risk of falling (cutoff score for fall risk 13.5 sec in community dwelling older adults per [facility] et al, 2000)  Baseline: 13.57sec with SPC, altered mechanics  Goal status: INITIAL     6. Pt will demonstrate appropriate performance of final prescribed HEP in order to facilitate improved self-management of symptoms post-discharge.   Baseline: initial HEP prescribed  Goal status: INITIAL     PLAN:  PT FREQUENCY: 2x/week  PT DURATION: 6 weeks  PLANNED INTERVENTIONS: Therapeutic exercises, Therapeutic activity, Neuromuscular  re-education, Balance training, Gait training, Patient/Family education, Self Care, Joint mobilization, Stair training, Cryotherapy, Moist heat, Taping, Manual therapy, and Re-evaluation  PLAN FOR NEXT SESSION: Review/update HEP PRN. Work on [employer] exercises as appropriate with emphasis on quad activation, knee mobility in open and closed chain, functional mobility. Symptom modification strategies as indicated/appropriate.    Alphonzo Severance PT, DPT 04/04/2023 1:44 PM

## 2023-04-10 ENCOUNTER — Ambulatory Visit: Payer: Medicaid Other | Admitting: Physical Therapy

## 2023-04-11 ENCOUNTER — Other Ambulatory Visit (HOSPITAL_COMMUNITY): Payer: Self-pay

## 2023-04-11 MED ORDER — WEGOVY 1 MG/0.5ML ~~LOC~~ SOAJ
1.0000 mg | SUBCUTANEOUS | 0 refills | Status: DC
  Filled 2023-04-11 – 2023-05-03 (×2): qty 2, 28d supply, fill #0

## 2023-04-12 ENCOUNTER — Encounter: Payer: Medicaid Other | Admitting: Physical Therapy

## 2023-04-13 ENCOUNTER — Encounter: Payer: Self-pay | Admitting: Physical Therapy

## 2023-04-17 NOTE — Therapy (Signed)
PHYSICAL THERAPY TREATMENT NOTE + DISCHARGE SUMMARY   Patient Name: Justin [name] MRN: [id number] DOB:Nov 06, 1970, 52 y.o., male Today's Date: 04/18/2023  PHYSICAL THERAPY DISCHARGE SUMMARY  Visits from Start of Care: 3  Current functional level related to goals / functional outcomes: Back to PLOF - performing daily activities and mobility without assistive device, some increase in pain   Remaining deficits: L quad weakness, pain   Education / Equipment: HEP, gradual progression of activities, discharge education, follow up with provider   Patient agrees to discharge. Patient goals were partially met. Patient is being discharged due to the patient's request.    END OF SESSION:  PT End of Session - 04/18/23 1404     Visit Number 3    Number of Visits 13    Date for PT Re-Evaluation 05/09/23    Authorization Type medicaid [email or web address]    Authorization Time Period Auth after 27 visits per appt notes    PT Start Time 1404    PT Stop Time 1434    PT Time Calculation (min) 30 min    Activity Tolerance Patient tolerated treatment well;No increased pain    Behavior During Therapy WFL for tasks assessed/performed              Past Medical History:  Diagnosis Date   Anxiety    Arthritis    Depression    Gout    History of kidney stones    Hypertension    Kidney stones age 58 to 51   throughout life several times, on allopurinol prophylaxis   Migraine headache    Neuropathy    mainly left leg , but bilat legs s/p rapid weight loss and gastric bypass surgery   Obese    OSA on CPAP    uses cpap   Tremor    Past Surgical History:  Procedure Laterality Date   CHOLECYSTECTOMY N/A 03/13/2016   Procedure: LAPAROSCOPIC CHOLECYSTECTOMY WITH INTRAOPERATIVE CHOLANGIOGRAM;  Surgeon: Gaynelle Adu, MD;  Location: [facility];  Service: General;  Laterality: N/A;   GASTRIC ROUX-EN-Y N/A 05/26/2014   Procedure: LAPAROSCOPIC ROUX-EN-Y GASTRIC BYPASS WITH UPPER ENDOSCOPY;  Surgeon:  Atilano Ina, MD;  Location: [facility];  Service: General;  Laterality: N/A;   KNEE ARTHROSCOPY Left 10/04/2015   Procedure: LEFT KNEE ARTHROSCOPY WITH LATERAL RETINACULAR RELEASE MEDIAL PLICA INCISION;  Surgeon: Jodi Geralds, MD;  Location: [facility];  Service: Orthopedics;  Laterality: Left;   KNEE ARTHROSCOPY Left 03/12/2023   Procedure: LEFT ARTHROSCOPY KNEE WITH DEBRIDEMENT AND CHONDROPLASTY MEDIAL, LATERAL, AND PATELLA FEMORAL;  Surgeon: Jodi Geralds, MD;  Location: [facility];  Service: Orthopedics;  Laterality: Left;   LEFT HEART CATH AND CORONARY ANGIOGRAPHY N/A 11/02/2017   Procedure: LEFT HEART CATH AND CORONARY ANGIOGRAPHY;  Surgeon: Yates Decamp, MD;  Location: [facility];  Service: Cardiovascular;  Laterality: N/A;   LEFT HEART CATHETERIZATION WITH CORONARY ANGIOGRAM N/A 09/17/2013   Procedure: LEFT HEART CATHETERIZATION WITH CORONARY ANGIOGRAM;  Surgeon: Peter M Swaziland, MD;  Location: [facility] CATH LAB;  Service: Cardiovascular;  Laterality: N/A;   llithotripsy      TOOTH EXTRACTION     UPPER GI ENDOSCOPY  05/26/2014   Procedure: UPPER GI ENDOSCOPY;  Surgeon: Atilano Ina, MD;  Location: [facility];  Service: General;;   Patient Active Problem List   Diagnosis Date Noted   Chondromalacia, left knee 03/12/2023   Chondral loose body of left knee joint 03/12/2023   Fever 03/20/2018   Cough 03/20/2018   Body  aches 03/20/2018   Allergic conjunctivitis of both eyes 10/01/2017   Allergic rhinitis due to pollen 10/01/2017   Otalgia 10/01/2017   Left inguinal pain 08/03/2017   Encounter for health maintenance examination in adult 05/03/2017   History of kidney stones 05/03/2017   Neuropathy 04/13/2017   Sore throat 04/13/2017   History of gastric bypass 04/13/2017   Rhinitis, allergic 09/27/2016   Left leg swelling 03/22/2016   Chondromalacia, patella, left 10/04/2015   Synovial plica of left knee 10/04/2015   Left thigh pain 08/09/2015   Rapid heart beat 12/09/2014   Left knee pain 12/09/2014    Morbid obesity with BMI of 50.0-59.9, adult (HCC) 02/04/2014   Chest pain 12/18/2011   Upper respiratory tract infection 03/28/2011   Sleep apnea 08/08/2010   VENTRAL HERNIA 04/06/2010   Anxiety state 10/19/2008   Gout 08/03/2008   HYPERTENSION, BENIGN 08/03/2008    PCP: Judd Lien, PA-C  REFERRING PROVIDER: Jodi Geralds, MD  REFERRING DIAG: S/P Left Knee Arthroscopy  THERAPY DIAG:  Left knee pain, unspecified chronicity  Stiffness of left knee, not elsewhere classified  Other abnormalities of gait and mobility  Localized edema  Rationale for Evaluation and Treatment: Rehabilitation  ONSET DATE: 03/12/23 L knee arthroscopy  SUBJECTIVE:   SUBJECTIVE STATEMENT: Pt states knee continues to feel much better, still having some pain from time to time. Still having some difficulty with stair navigation. Has been very busy and still having some pain but states he feels better than before surgery. States he would like to discharge today    PERTINENT HISTORY: HTN, sleep apnea, neuropathy, gout, anxiety/depression, migraines  PAIN:  Are you having pain: 3-4/10 back pain, 2/10 knee  Worst in past week: 6-7/10 when "overdoing it"  Per eval -  Location/description: anterior knee,  Best-worst over past week: 1-6/10  - aggravating factors: WB, walking in grocery store, transfers - Easing factors: medication, ice    PRECAUTIONS: none (s/p L knee scope 03/12/23)   WEIGHT BEARING RESTRICTIONS: No  FALLS:  Has patient fallen in last 6 months? Yes. Number of falls 2 falls prior to surgery  LIVING ENVIRONMENT: [employer] - 1 level home, 1 STE vs ramp Lives w/ wife, daughter in college for LPN, son who has special needs (nonverbal, uses wheelchair per pt report) Helps caring for son, doesn't do transfers due to back history Cane, walker, grab rails  OCCUPATION: minister  PLOF: Independent - with modifications given back pain issues and knee issues  PATIENT  GOALS: be able to walk more, be more active and be able to walk around the block in his neighborhood  NEXT MD VISIT: Mid November 2024  OBJECTIVE: (objective measures completed at initial evaluation unless otherwise dated)   DIAGNOSTIC FINDINGS: s/p L knee scope 03/12/23  PATIENT SURVEYS:  FOTO 47 current, 52 predicted FOTO 67 on 04/18/23  COGNITION: Overall cognitive status: Within functional limits for tasks assessed     SENSATION: Pt reports hx chronic n/t in LLE from lumbar issues, increased since surgery, not formally assessed but appears grossly intact to LT with palpation exam  EDEMA/OBSERVATION:  Gross edema about knee joint L side, not formally measured. Is observed to have area of mild bruising about distal/lateral knee - pt indicates this is where surgeon had to drain buildup of blood after surgery. He describes this bruising and edema both as improving with time  PALPATION: Concordant tenderness L patellar tendon, lateral and medial quad, distal adductors  LOWER EXTREMITY ROM:  Active  Right eval Left eval Left 04/18/23 Left  Hip flexion      Hip extension      Hip internal rotation      Hip external rotation      Knee extension  Lacking 10 deg passively with discomfort Near 0 Full extension  Knee flexion  88 deg actively   122 deg  (Blank rows = not tested) (Key: WFL = within functional limits not formally assessed, * = concordant pain, s = stiffness/stretching sensation, NT = not tested)  Comments:    LOWER EXTREMITY MMT:    MMT Right 04/18/23 Left 04/18/23  Hip flexion    Hip abduction (modified sitting)    Hip internal rotation    Hip external rotation    Knee flexion 5 5  Knee extension 5 4+ mild crepitus nonpainful  Ankle dorsiflexion     (Blank rows = not tested) (Key: WFL = within functional limits not formally assessed, * = concordant pain, s = stiffness/stretching sensation, NT = not tested)  Comments: deferred given pain/swelling and  acuity of surgery  FUNCTIONAL TESTS:  TUG: 13.57sec w SPC, significantly reduced eccentric control back into chair, reduced WB through surgical limb with ascent/descent  TUG 04/18/23: 8.15sec no AD, improved eccentric control into chair, pt does note mild pain during turning portion of test that is transient and resolves with rest   GAIT: Distance walked: within clinic Assistive device utilized: Single point cane Level of assistance: Modified independence Comments: antalgic gait LLE, wide BOS   TODAY'S TREATMENT:                                                                                                                              [facility] Adult PT Treatment:                                                DATE: 04/18/23 Therapeutic Exercise: LAQ x12 SLR x12 STS 8 Heel slides x12 HEP review/education + handout  Therapeutic Activity: MSK assessment + education FOTO + education TUG + education Education/discussion re: progress with PT, symptom behavior as it affects activity tolerance, PT goals/POC, discharge education and follow up with provider, gradual progression of activity   PATIENT EDUCATION:  Education details: rationale for interventions, HEP, discharge education, gradual progression of activity, follow up with provider Person educated: Patient Education method: Explanation, Demonstration, Tactile cues, Verbal cues Education comprehension: verbalized understanding, returned demonstration, verbal cues required, tactile cues required  HOME EXERCISE PROGRAM: Access Code: [email or web address] URL: [email or web address] Date: 04/18/2023 Prepared by: Fransisco Hertz  Exercises - Seated Long Arc Quad  - 2-3 x daily - 7 x weekly - 1 sets - 8-10 reps - Seated Heel Slide  - 2-3 x daily - 7 x weekly - 1 sets - 8-10 reps - Small Range Straight  Leg Raise  - 1 x daily - 7 x weekly - 3 sets - 10 reps - Sit to Stand with Armchair  - 2-3 x daily - 7 x weekly - 1 sets - 8  reps  ASSESSMENT:  CLINICAL IMPRESSION: Pt arrives and politely requests discharge today - states he is still having fair bit of pain when he "overdoes it" but is feeling back to normal "if not better" compared to before surgery. States he has been diligent with HEP, ambulating without cane, and back to full work/community activities. Looking at goals pt still has some mild L quad weakness but has met ROM, TUG, and FOTO goals. TUG elicits mild transient pain during turning but resolves with rest, and time is no longer within fall risk category at this point. Did discuss with pt that given pain and continued quad weakness we could continue along POC but he states he would prefer to discharge at this time which is respected. Education on gradual progression of activity, monitoring symptoms, and follow up with provider as needed (states he has an appt mid-November). No adverse events, departs without any pain. Verbalizes agreement/understanding with discharge plan. Pt departs today's session in no acute distress, all voiced questions/concerns addressed appropriately from PT perspective.    OBJECTIVE IMPAIRMENTS: Abnormal gait, decreased activity tolerance, decreased balance, decreased endurance, decreased mobility, difficulty walking, decreased ROM, decreased strength, improper body mechanics, and pain.   ACTIVITY LIMITATIONS: carrying, lifting, standing, squatting, stairs, transfers, and locomotion level  PARTICIPATION LIMITATIONS: meal prep, cleaning, laundry, shopping, and community activity  PERSONAL FACTORS: Time since onset of injury/illness/exacerbation and 3+ comorbidities: HTN, sleep apnea, neuropathy, gout, anxiety/depression, migraines  are also affecting patient's functional outcome.   REHAB POTENTIAL: Good  CLINICAL DECISION MAKING: Stable/uncomplicated  EVALUATION COMPLEXITY: Low   GOALS: Goals reviewed with patient? Yes  SHORT TERM GOALS: Target date: 04/18/2023   Pt will  demonstrate appropriate understanding and performance of initially prescribed HEP in order to facilitate improved independence with management of symptoms.  Baseline: HEP provided on eval 04/18/23: reports daily adherence  Goal status: MET   2. Pt will score greater than or equal to 50 on FOTO in order to demonstrate improved perception of function due to symptoms.  Baseline: 47  04/18/23: 67   Goal status: MET  LONG TERM GOALS: Target date: 05/09/2023 Pt will score 52 or greater on FOTO in order to demonstrate improved perception of function due to symptoms.  Baseline: 47 04/18/23: 67 Goal status: MET  2.  Pt will demonstrate at least 0-110 degrees of L knee AROM in order to facilitate improved tolerance to functional movements such as squatting/stairs.  Baseline: see ROM chart above 04/18/23: 0-122 deg Goal status: MET   3.  Pt will demonstrate grossly symmetrical knee strength via MMT in order to facilitate improved functional strength. Baseline: deferred given pain/swelling and proximity to surgery 04/18/23: mild quad weakness L relative to R  Goal status: NOT MET  4.  Pt will report at least 50% decrease in overall pain levels in past week in order to facilitate improved tolerance to basic ADLs/mobility.   Baseline: 1-6/10  04/18/23:  up to 6-7/10  Goal status: NOT MET  5. Pt will be able to perform TUG in less than or equal to 11sec sec in order to indicate reduced risk of falling (cutoff score for fall risk 13.5 sec in community dwelling older adults per [place] et al, 2000)  Baseline: 13.57sec with SPC, altered mechanics  04/18/23: 8  sec no AD  Goal status: MET   6. Pt will demonstrate appropriate performance of final prescribed HEP in order to facilitate improved self-management of symptoms post-discharge.   Baseline: initial HEP prescribed  04/18/23: good HEP adherence reported, does well in clinic  Goal status: MET   PLAN: DISCHARGE 04/18/23  PT FREQUENCY:  NA  PT DURATION: NA  PLANNED INTERVENTIONS: NA  PLAN FOR NEXT SESSION: discharge to independent HEP, follow up with provider as needed    Ashley Murrain PT, DPT 04/18/2023 2:46 PM

## 2023-04-18 ENCOUNTER — Ambulatory Visit: Payer: Medicaid Other | Admitting: Physical Therapy

## 2023-04-18 ENCOUNTER — Encounter: Payer: Self-pay | Admitting: Physical Therapy

## 2023-04-18 DIAGNOSIS — R600 Localized edema: Secondary | ICD-10-CM

## 2023-04-18 DIAGNOSIS — R2689 Other abnormalities of gait and mobility: Secondary | ICD-10-CM

## 2023-04-18 DIAGNOSIS — M25662 Stiffness of left knee, not elsewhere classified: Secondary | ICD-10-CM

## 2023-04-18 DIAGNOSIS — M25562 Pain in left knee: Secondary | ICD-10-CM | POA: Diagnosis not present

## 2023-04-20 ENCOUNTER — Ambulatory Visit: Payer: Medicaid Other | Admitting: Physical Therapy

## 2023-04-24 ENCOUNTER — Ambulatory Visit: Payer: Medicaid Other | Admitting: Physical Therapy

## 2023-05-01 ENCOUNTER — Encounter: Payer: Self-pay | Admitting: Podiatry

## 2023-05-01 ENCOUNTER — Ambulatory Visit (INDEPENDENT_AMBULATORY_CARE_PROVIDER_SITE_OTHER): Payer: Medicaid Other | Admitting: Podiatry

## 2023-05-01 DIAGNOSIS — M722 Plantar fascial fibromatosis: Secondary | ICD-10-CM

## 2023-05-01 MED ORDER — TRIAMCINOLONE ACETONIDE 40 MG/ML IJ SUSP
20.0000 mg | Freq: Once | INTRAMUSCULAR | Status: AC
Start: 2023-05-01 — End: 2023-05-01
  Administered 2023-05-01: 20 mg

## 2023-05-01 NOTE — Progress Notes (Signed)
He presents today for follow-up of his painful medial heel left.  She is he thinks he needs a shot.  Objective: Vital signs stable he is alert oriented x 3.  Pulses are palpable.  Palpable mass on the medial aspect of the left foot with pain on palpation of this medial calcaneal tubercle consistent with fasciitis.  Assessment: Plantar fasciitis left.  Plan: Injected the area today with Kenalog and local anesthetic follow-up with him on as-needed basis.

## 2023-05-03 ENCOUNTER — Other Ambulatory Visit (HOSPITAL_COMMUNITY): Payer: Self-pay

## 2023-05-04 ENCOUNTER — Other Ambulatory Visit (HOSPITAL_COMMUNITY): Payer: Self-pay

## 2023-05-30 ENCOUNTER — Other Ambulatory Visit (HOSPITAL_COMMUNITY): Payer: Self-pay

## 2023-05-30 MED ORDER — WEGOVY 1.7 MG/0.75ML ~~LOC~~ SOAJ
0.7500 mL | SUBCUTANEOUS | 0 refills | Status: DC
  Filled 2023-05-30: qty 3, 28d supply, fill #0

## 2023-05-31 ENCOUNTER — Other Ambulatory Visit (HOSPITAL_COMMUNITY): Payer: Self-pay

## 2023-06-26 ENCOUNTER — Other Ambulatory Visit (HOSPITAL_COMMUNITY): Payer: Self-pay

## 2023-06-26 ENCOUNTER — Other Ambulatory Visit (HOSPITAL_BASED_OUTPATIENT_CLINIC_OR_DEPARTMENT_OTHER): Payer: Self-pay

## 2023-06-26 MED ORDER — WEGOVY 2.4 MG/0.75ML ~~LOC~~ SOAJ
2.4000 mg | SUBCUTANEOUS | 2 refills | Status: AC
  Filled 2023-06-26: qty 3, 28d supply, fill #0

## 2023-06-27 ENCOUNTER — Other Ambulatory Visit (HOSPITAL_COMMUNITY): Payer: Self-pay

## 2023-07-25 ENCOUNTER — Other Ambulatory Visit (HOSPITAL_COMMUNITY): Payer: Self-pay

## 2023-07-25 MED ORDER — WEGOVY 2.4 MG/0.75ML ~~LOC~~ SOAJ
2.4000 mg | SUBCUTANEOUS | 5 refills | Status: DC
Start: 2023-07-25 — End: 2023-09-04
  Filled 2023-07-25: qty 3, 28d supply, fill #0
  Filled 2023-08-21: qty 3, 28d supply, fill #1

## 2023-08-23 ENCOUNTER — Other Ambulatory Visit: Payer: Self-pay

## 2023-08-28 ENCOUNTER — Other Ambulatory Visit (HOSPITAL_COMMUNITY): Payer: Self-pay

## 2023-09-05 ENCOUNTER — Other Ambulatory Visit: Payer: Self-pay

## 2023-09-25 ENCOUNTER — Ambulatory Visit (INDEPENDENT_AMBULATORY_CARE_PROVIDER_SITE_OTHER): Admitting: Podiatry

## 2023-09-25 ENCOUNTER — Encounter: Payer: Self-pay | Admitting: Podiatry

## 2023-09-25 DIAGNOSIS — M722 Plantar fascial fibromatosis: Secondary | ICD-10-CM

## 2023-09-25 MED ORDER — TRIAMCINOLONE ACETONIDE 40 MG/ML IJ SUSP
40.0000 mg | Freq: Once | INTRAMUSCULAR | Status: AC
  Administered 2023-09-25: 40 mg

## 2023-09-25 NOTE — Progress Notes (Signed)
 Justin [name] presents today chief complaint of pain to the heels.  Objective: Vital signs [name] he is alert and oriented x 3 pulses are palpable bilateral has severe pain on palpation medial calcaneal tubercle left greater than right.  Assessment plantar fasciitis.  Plan: Injected bilateral heels today 20 mg Kenalog 5 mg Marcaine point maximal tenderness.  Tolerated procedure well without complications.  Follow-up with him in 1 month or as needed.

## 2023-12-18 ENCOUNTER — Emergency Department (HOSPITAL_BASED_OUTPATIENT_CLINIC_OR_DEPARTMENT_OTHER)
Admission: EM | Admit: 2023-12-18 | Discharge: 2023-12-18 | Disposition: A | Discharge status: Home / Self Care | Attending: Emergency Medicine | Admitting: Emergency Medicine

## 2023-12-18 ENCOUNTER — Other Ambulatory Visit: Payer: Self-pay

## 2023-12-18 ENCOUNTER — Encounter (HOSPITAL_BASED_OUTPATIENT_CLINIC_OR_DEPARTMENT_OTHER): Payer: Self-pay

## 2023-12-18 DIAGNOSIS — R059 Cough, unspecified: Secondary | ICD-10-CM | POA: Diagnosis present

## 2023-12-18 DIAGNOSIS — Z794 Long term (current) use of insulin: Secondary | ICD-10-CM | POA: Diagnosis not present

## 2023-12-18 DIAGNOSIS — Z7982 Long term (current) use of aspirin: Secondary | ICD-10-CM | POA: Diagnosis not present

## 2023-12-18 DIAGNOSIS — J069 Acute upper respiratory infection, unspecified: Secondary | ICD-10-CM | POA: Insufficient documentation

## 2023-12-18 LAB — GROUP A STREP BY PCR: Group A Strep by PCR: NOT DETECTED

## 2023-12-18 LAB — RESP PANEL BY RT-PCR (RSV, FLU A&B, COVID)  RVPGX2
Influenza A by PCR: NEGATIVE
Influenza B by PCR: NEGATIVE
Resp Syncytial Virus by PCR: NEGATIVE
SARS Coronavirus 2 by RT PCR: NEGATIVE

## 2023-12-18 NOTE — ED Triage Notes (Signed)
 Pt POV reporting cough, congestion, and sore throat, concerned for strep.

## 2023-12-18 NOTE — ED Provider Notes (Signed)
 [place] EMERGENCY DEPARTMENT AT [facility] Provider Note   CSN: [id number] Arrival date & time: 12/18/23  0146     Patient presents with: Sore Throat   Justin [name] is a 53 y.o. male.   Presents to the emergency department for evaluation of nasal congestion, sore throat, ear pain, cough.       Prior to Admission medications   Medication Sig Start Date End Date Taking? Authorizing Provider  albuterol  (VENTOLIN  HFA) 108 (90 Base) MCG/ACT inhaler Inhale 2 puffs into the lungs every 6 (six) hours as needed for wheezing or shortness of breath.    [provider]  allopurinol  (ZYLOPRIM ) 300 MG tablet Take 1 tablet (300 mg total) by mouth daily. 05/29/18   Tysinger, Alm RAMAN, PA-C  Ascorbic Acid (VITAMIN C) 1000 MG tablet Take 1,000 mg by mouth every evening.    [provider]  aspirin  81 MG chewable tablet Chew 81 mg by mouth daily. 10/12/20   [provider]  buPROPion (WELLBUTRIN XL) 300 MG 24 hr tablet Take 300 mg by mouth daily.    [provider]  busPIRone  (BUSPAR ) 15 MG tablet TAKE 1 TABLET BY MOUTH TWICE DAILY 05/03/18   Tysinger, Alm RAMAN, PA-C  busPIRone  (BUSPAR ) 30 MG tablet Take 30 mg by mouth 2 (two) times daily.    [provider]  Calcium  Citrate 250 MG TABS Take 250 mg by mouth daily. 04/07/20   [provider]  carvedilol  (COREG ) 3.125 MG tablet TAKE 1 TABLET BY MOUTH TWICE DAILY WITH A MEAL 04/15/18   Tysinger, Alm RAMAN, PA-C  cetirizine (ZYRTEC) 10 MG tablet Take 10 mg by mouth daily.    [provider]  cholecalciferol (VITAMIN D ) 1000 units tablet Take 1,000 Units by mouth daily.    [provider]  diphenhydrAMINE (BENADRYL) 25 MG tablet Take 25 mg by mouth at bedtime as needed for itching. 10/03/19   [provider]  divalproex (DEPAKOTE ER) 500 MG 24 hr tablet Take 500 mg by mouth in the morning, at noon, and at bedtime. 01/26/22   [provider]  escitalopram  (LEXAPRO) 20 MG tablet Take 20 mg by mouth daily.    [provider]  Ferrous Sulfate (IRON PO) Take 1 capsule by mouth daily. 04/07/20   [provider]  furosemide  (LASIX ) 20 MG tablet Take 20 mg by mouth daily as needed for fluid. 07/21/22   [provider]  HYDROcodone -acetaminophen  (NORCO/VICODIN) 5-325 MG tablet Take 1-2 tablets by mouth every 6 (six) hours as needed for moderate pain. For postop pain 03/12/23   Rondall Agent, PA-C  isosorbide mononitrate (IMDUR) 30 MG 24 hr tablet Take 30 mg by mouth daily. 12/27/21 12/18/23  [provider]  lisdexamfetamine (VYVANSE) 60 MG capsule Take 60 mg by mouth daily.    [provider]  lisinopril -hydrochlorothiazide  (ZESTORETIC ) 10-12.5 MG tablet Take 1 tablet by mouth daily. 07/21/22   [provider]  meclizine (ANTIVERT) 25 MG tablet Take 25 mg by mouth 3 (three) times daily as needed for dizziness.    [provider]  methocarbamol (ROBAXIN) 750 MG tablet Take 750 mg by mouth 3 (three) times daily. 03/01/18   [provider]  nitroGLYCERIN  (NITROSTAT ) 0.4 MG SL tablet Place 0.4 mg under the tongue every 5 (five) minutes as needed for chest pain.    [provider]  ondansetron  (ZOFRAN ) 8 MG tablet Take 8 mg by mouth every 8 (eight) hours as needed for nausea or  vomiting.    [provider]  pantoprazole  (PROTONIX ) 20 MG tablet Take 20 mg by mouth daily.    [provider]  pravastatin  (PRAVACHOL ) 40 MG tablet TAKE 1 TABLET BY MOUTH EVERY EVENING 01/16/18   Tysinger, Alm RAMAN, PA-C  pregabalin (LYRICA) 100 MG capsule Take 100 mg by mouth 3 (three) times daily.    [provider]  Semaglutide -Weight Management (WEGOVY ) 2.4 MG/0.75ML SOAJ Inject 2.4 mg into the skin once a week. 06/26/23     Semaglutide -Weight Management 0.25 MG/0.5ML SOAJ Inject 0.25 mg into the skin once a week. 01/19/23   [name], PA-C  traMADol  (ULTRAM -ER) 100 MG 24 hr tablet  Take 100 mg by mouth in the morning. 02/04/23   [provider]  UBRELVY 100 MG TABS Take 100 mg by mouth as needed (migraine). 02/04/23   [provider]  ZEPBOUND 5 MG/0.5ML Pen Inject into the skin. 09/07/23   [provider]  Zinc 50 MG TABS Take 50 mg by mouth every morning.    [provider]    Allergies: Lamictal [lamotrigine], Tegretol [carbamazepine], Sulfonamide derivatives, Influenza vaccines, and Nsaids    Review of Systems  Updated Vital Signs BP 125/69   Pulse 82   Temp 98 F (36.7 C) (Oral)   Resp 17   Ht 5' 8 (1.727 m)   Wt (!) 150.6 kg   SpO2 99%   BMI 50.48 kg/m   Physical Exam Vitals and nursing note reviewed.  Constitutional:      General: He is not in acute distress.    Appearance: He is well-developed.  HENT:     Head: Normocephalic and atraumatic.     Mouth/Throat:     Mouth: Mucous membranes are moist.   Eyes:     General: Vision grossly intact. Gaze aligned appropriately.     Extraocular Movements: Extraocular movements intact.     Conjunctiva/sclera: Conjunctivae normal.    Cardiovascular:     Rate and Rhythm: Normal rate and regular rhythm.     Pulses: Normal pulses.     Heart sounds: Normal heart sounds, S1 normal and S2 normal. No murmur heard.    No friction rub. No gallop.  Pulmonary:     Effort: Pulmonary effort is normal. No respiratory distress.     Breath sounds: Normal breath sounds.  Abdominal:     Palpations: Abdomen is soft.     Tenderness: There is no abdominal tenderness. There is no guarding or rebound.     Hernia: No hernia is present.   Musculoskeletal:        General: No swelling.     Cervical back: Full passive range of motion without pain, normal range of motion and neck supple. No pain with movement, spinous process tenderness or muscular tenderness. Normal range of motion.     Right lower leg: No edema.     Left lower leg: No edema.   Skin:    General: Skin is warm and dry.      Capillary Refill: Capillary refill takes less than 2 seconds.     Findings: No ecchymosis, erythema, lesion or wound.   Neurological:     Mental Status: He is alert and oriented to person, place, and time.     GCS: GCS eye subscore is 4. GCS verbal subscore is 5. GCS motor subscore is 6.     Cranial Nerves: Cranial nerves 2-12 are intact.     Sensory: Sensation is intact.  Motor: Motor function is intact. No weakness or abnormal muscle tone.     Coordination: Coordination is intact.   Psychiatric:        Mood and Affect: Mood normal.        Speech: Speech normal.        Behavior: Behavior normal.     (all labs ordered are listed, but only abnormal results are displayed) Labs Reviewed  GROUP A STREP BY PCR  RESP PANEL BY RT-PCR (RSV, FLU A&B, COVID)  [id number]    EKG: None  Radiology: No results found.   Procedures   Medications Ordered in the ED - No data to display                                  Medical Decision Making  Differential Diagnosis considered includes, but not limited to: COVID-19; influenza; RSV; simple viral URI; strep pharyngitis; pneumonia  Presents to the emergency department stating that he has cold symptoms.  Patient appears well.  Vital signs normal.  Examination unremarkable.  Negative COVID, negative influenza, negative RSV, negative strep.  Treat as viral URI.     Final diagnoses:  Upper respiratory tract infection, unspecified type    ED Discharge Orders     None          Haze Lonni PARAS, MD 12/18/23 [phone]

## 2024-01-31 ENCOUNTER — Other Ambulatory Visit: Payer: Self-pay

## 2024-01-31 ENCOUNTER — Encounter (HOSPITAL_BASED_OUTPATIENT_CLINIC_OR_DEPARTMENT_OTHER): Payer: Self-pay | Admitting: Emergency Medicine

## 2024-01-31 ENCOUNTER — Emergency Department (HOSPITAL_BASED_OUTPATIENT_CLINIC_OR_DEPARTMENT_OTHER): Admission: EM | Admit: 2024-01-31 | Discharge: 2024-01-31 | Disposition: A | Discharge status: Home / Self Care

## 2024-01-31 DIAGNOSIS — E876 Hypokalemia: Secondary | ICD-10-CM | POA: Insufficient documentation

## 2024-01-31 DIAGNOSIS — Z79899 Other long term (current) drug therapy: Secondary | ICD-10-CM | POA: Diagnosis not present

## 2024-01-31 DIAGNOSIS — R1013 Epigastric pain: Secondary | ICD-10-CM | POA: Diagnosis present

## 2024-01-31 DIAGNOSIS — I10 Essential (primary) hypertension: Secondary | ICD-10-CM | POA: Diagnosis not present

## 2024-01-31 DIAGNOSIS — Z7982 Long term (current) use of aspirin: Secondary | ICD-10-CM | POA: Insufficient documentation

## 2024-01-31 LAB — CBC
HCT: 43 % (ref 39.0–52.0)
Hemoglobin: 14.6 g/dL (ref 13.0–17.0)
MCH: 32 pg (ref 26.0–34.0)
MCHC: 34 g/dL (ref 30.0–36.0)
MCV: 94.3 fL (ref 80.0–100.0)
Platelets: 291 K/uL (ref 150–400)
RBC: 4.56 MIL/uL (ref 4.22–5.81)
RDW: 14.4 % (ref 11.5–15.5)
WBC: 9.5 K/uL (ref 4.0–10.5)
nRBC: 0 % (ref 0.0–0.2)

## 2024-01-31 LAB — COMPREHENSIVE METABOLIC PANEL WITH GFR
ALT: 19 U/L (ref 0–44)
AST: 19 U/L (ref 15–41)
Albumin: 4.2 g/dL (ref 3.5–5.0)
Alkaline Phosphatase: 69 U/L (ref 38–126)
Anion gap: 13 (ref 5–15)
BUN: 13 mg/dL (ref 6–20)
CO2: 29 mmol/L (ref 22–32)
Calcium: 9 mg/dL (ref 8.9–10.3)
Chloride: 100 mmol/L (ref 98–111)
Creatinine, Ser: 0.93 mg/dL (ref 0.61–1.24)
GFR, Estimated: >60 mL/min (ref >60)
Glucose, Bld: 86 mg/dL (ref 70–99)
Potassium: 3.3 mmol/L — ABNORMAL LOW (ref 3.5–5.1)
Sodium: 142 mmol/L (ref 135–145)
Total Bilirubin: 0.4 mg/dL (ref 0.0–1.2)
Total Protein: 6.9 g/dL (ref 6.5–8.1)

## 2024-01-31 LAB — URINALYSIS, ROUTINE W REFLEX MICROSCOPIC
Bilirubin Urine: NEGATIVE
Glucose, UA: NEGATIVE mg/dL
Hgb urine dipstick: NEGATIVE
Ketones, ur: NEGATIVE mg/dL
Leukocytes,Ua: NEGATIVE
Nitrite: NEGATIVE
Protein, ur: NEGATIVE mg/dL
Specific Gravity, Urine: 1.015 (ref 1.005–1.030)
pH: 6.5 (ref 5.0–8.0)

## 2024-01-31 LAB — LIPASE, BLOOD: Lipase: 35 U/L (ref 11–51)

## 2024-01-31 MED ORDER — LIDOCAINE VISCOUS HCL 2 % MT SOLN
15.0000 mL | Freq: Once | OROMUCOSAL | Status: AC
  Administered 2024-01-31: 15 mL via OROMUCOSAL
  Filled 2024-01-31: qty 15

## 2024-01-31 MED ORDER — ALUM & MAG HYDROXIDE-SIMETH 200-200-20 MG/5ML PO SUSP
30.0000 mL | Freq: Once | ORAL | Status: AC
Start: 2024-01-31 — End: 2024-01-31
  Administered 2024-01-31: 30 mL via ORAL
  Filled 2024-01-31: qty 30

## 2024-01-31 MED ORDER — LIDOCAINE VISCOUS HCL 2 % MT SOLN: 15.0000 mL | OROMUCOSAL | 0 refills | Status: AC | PRN

## 2024-01-31 NOTE — ED Notes (Signed)
 Pt alert and oriented X 4 at the time of discharge. RR even and unlabored. No acute distress noted. Pt verbalized understanding of discharge instructions as discussed. Pt ambulatory to lobby at time of discharge.

## 2024-01-31 NOTE — Discharge Instructions (Addendum)
 As we discussed, you can get Mylanta over-the-counter.  Please pick up the viscous lidocaine  and take as prescribed as needed.  You can follow-up with your primary care doctor for further evaluation.  You may return to the emergency department for any worsening symptoms.

## 2024-01-31 NOTE — ED Triage Notes (Signed)
 Sharp pain upper abd x 24 hours that comes and goes, also need left lower legs needs to be checked hx of celluititis

## 2024-01-31 NOTE — ED Provider Notes (Signed)
 [place] [facility] AT [facility] Provider Note   CSN: [id number] Arrival date & time: 01/31/24  1129     Patient presents with: Abdominal Pain   Justin [name] is a 53 y.o. male patient with history of gout, hypertension, obesity who presents to the emergency department today for further evaluation of epigastric abdominal pain this been present for last 24 hours.  Patient had a well care visit with his primary care doctor today and I sent him over for questionable pancreatitis.  Patient does not drink alcohol.  He denies any nausea, vomiting, diarrhea, fever, chills, chest pain, shortness of breath.  Abdominal pain is sharp and intermittent in nature.    Abdominal Pain      Prior to Admission medications   Medication Sig Start Date End Date Taking? Authorizing Provider  lidocaine  (XYLOCAINE ) 2 % solution Use as directed 15 mLs in the mouth or throat as needed for mouth pain. 01/31/24  Yes Theotis, Shariyah Eland M, PA-C  albuterol  (VENTOLIN  HFA) 108 (90 Base) MCG/ACT inhaler Inhale 2 puffs into the lungs every 6 (six) hours as needed for wheezing or shortness of breath.    [provider]  allopurinol  (ZYLOPRIM ) 300 MG tablet Take 1 tablet (300 mg total) by mouth daily. 05/29/18   Tysinger, Alm RAMAN, PA-C  Ascorbic Acid (VITAMIN C) 1000 MG tablet Take 1,000 mg by mouth every evening.    [provider]  aspirin  81 MG chewable tablet Chew 81 mg by mouth daily. 10/12/20   [provider]  buPROPion (WELLBUTRIN XL) 300 MG 24 hr tablet Take 300 mg by mouth daily.    [provider]  busPIRone  (BUSPAR ) 15 MG tablet TAKE 1 TABLET BY MOUTH TWICE DAILY 05/03/18   Tysinger, Alm RAMAN, PA-C  busPIRone  (BUSPAR ) 30 MG tablet Take 30 mg by mouth 2 (two) times daily.    [provider]  Calcium  Citrate 250 MG TABS Take 250 mg by mouth daily. 04/07/20   [provider]  carvedilol  (COREG ) 3.125 MG tablet TAKE 1 TABLET BY MOUTH TWICE  DAILY WITH A MEAL 04/15/18   Tysinger, Alm RAMAN, PA-C  cetirizine (ZYRTEC) 10 MG tablet Take 10 mg by mouth daily.    [provider]  cholecalciferol (VITAMIN D ) 1000 units tablet Take 1,000 Units by mouth daily.    [provider]  diphenhydrAMINE (BENADRYL) 25 MG tablet Take 25 mg by mouth at bedtime as needed for itching. 10/03/19   [provider]  divalproex (DEPAKOTE ER) 500 MG 24 hr tablet Take 500 mg by mouth in the morning, at noon, and at bedtime. 01/26/22   [provider]  escitalopram (LEXAPRO) 20 MG tablet Take 20 mg by mouth daily.    [provider]  Ferrous Sulfate (IRON PO) Take 1 capsule by mouth daily. 04/07/20   [provider]  furosemide  (LASIX ) 20 MG tablet Take 20 mg by mouth daily as needed for fluid. 07/21/22   [provider]  HYDROcodone -acetaminophen  (NORCO/VICODIN) 5-325 MG tablet Take 1-2 tablets by mouth every 6 (six) hours as needed for moderate pain. For postop pain 03/12/23   Rondall Agent, PA-C  isosorbide mononitrate (IMDUR) 30 MG 24 hr tablet Take 30 mg by mouth daily. 12/27/21 12/18/23  [provider]  lisdexamfetamine (VYVANSE) 60 MG capsule Take 60 mg by mouth daily.    [provider]  lisinopril -hydrochlorothiazide  (ZESTORETIC ) 10-12.5 MG tablet Take 1 tablet by mouth daily. 07/21/22   [provider]  meclizine (ANTIVERT) 25 MG tablet Take 25 mg by mouth 3 (three) times daily as needed for dizziness.    [provider]  methocarbamol (ROBAXIN) 750 MG tablet Take 750 mg by mouth 3 (three) times daily. 03/01/18   [provider]  nitroGLYCERIN  (NITROSTAT ) 0.4 MG SL tablet Place 0.4 mg under the tongue every 5 (five) minutes as needed for chest pain.    [provider]  ondansetron  (ZOFRAN ) 8 MG tablet Take 8 mg by mouth every 8 (eight) hours as needed for nausea or vomiting.    [provider]  pantoprazole  (PROTONIX ) 20 MG tablet Take 20 mg  by mouth daily.    [provider]  pravastatin  (PRAVACHOL ) 40 MG tablet TAKE 1 TABLET BY MOUTH EVERY EVENING 01/16/18   Tysinger, Alm RAMAN, PA-C  pregabalin (LYRICA) 100 MG capsule Take 100 mg by mouth 3 (three) times daily.    [provider]  Semaglutide -Weight Management (WEGOVY ) 2.4 MG/0.75ML SOAJ Inject 2.4 mg into the skin once a week. 06/26/23     Semaglutide -Weight Management 0.25 MG/0.5ML SOAJ Inject 0.25 mg into the skin once a week. 01/19/23   [name], PA-C  traMADol  (ULTRAM -ER) 100 MG 24 hr tablet Take 100 mg by mouth in the morning. 02/04/23   [provider]  UBRELVY 100 MG TABS Take 100 mg by mouth as needed (migraine). 02/04/23   [provider]  ZEPBOUND 5 MG/0.5ML Pen Inject into the skin. 09/07/23   [provider]  Zinc 50 MG TABS Take 50 mg by mouth every morning.    [provider]    Allergies: Lamictal [lamotrigine], Tegretol [carbamazepine], Sulfonamide derivatives, Influenza vaccines, and Nsaids    Review of Systems  Gastrointestinal:  Positive for abdominal pain.  All other systems reviewed and are negative.   Updated Vital Signs BP 115/87   Pulse 68   Temp 97.8 F (36.6 C)   Resp 18   Ht 5' 7 (1.702 m)   Wt (!) 159.7 kg   SpO2 100%   BMI 55.13 kg/m   Physical Exam Vitals and nursing note reviewed.  Constitutional:      General: He is not in acute distress.    Appearance: Normal appearance.  HENT:     Head: Normocephalic and atraumatic.  Eyes:     General:        Right eye: No discharge.        Left eye: No discharge.  Cardiovascular:     Comments: Regular rate and rhythm.  S1/S2 are distinct without any evidence of murmur, rubs, or gallops.  Radial pulses are 2+ bilaterally.  Dorsalis pedis pulses are 2+ bilaterally.  No evidence of pedal edema. Pulmonary:     Comments: Clear to auscultation bilaterally.  Normal effort.  No respiratory distress.  No evidence of wheezes, rales, or rhonchi  heard throughout. Abdominal:     General: Abdomen is flat. Bowel sounds are normal. There is no distension.     Tenderness: There is abdominal tenderness in the epigastric area. There is no guarding or rebound.  Musculoskeletal:        General: Normal range of motion.     Cervical back: Neck supple.  Skin:    General: Skin is warm and dry.     Findings: No rash.  Neurological:     General: No focal deficit present.     Mental Status: He is alert.  Psychiatric:        Mood and Affect:  Mood normal.        Behavior: Behavior normal.     (all labs ordered are listed, but only abnormal results are displayed) Labs Reviewed  COMPREHENSIVE METABOLIC PANEL WITH GFR - Abnormal; Notable for the following components:      Result Value   Potassium 3.3 (*)    All other components within normal limits  LIPASE, BLOOD  CBC  URINALYSIS, ROUTINE W REFLEX MICROSCOPIC    EKG: None  Radiology: No results found.   Procedures   Medications Ordered in the ED  alum & mag hydroxide-simeth (MAALOX/MYLANTA) 200-200-20 MG/5ML suspension 30 mL (30 mLs Oral Given 01/31/24 1313)  lidocaine  (XYLOCAINE ) 2 % viscous mouth solution 15 mL (15 mLs Mouth/Throat Given 01/31/24 1313)    Clinical Course as of 01/31/24 1429  Thu Jan 31, 2024  1423 CBC Negative. [CF]  1423 Comprehensive metabolic panel(!) Mild hypokalemia. [CF]  1423 Lipase, blood Negative. [CF]  1423 Urinalysis, Routine w reflex microscopic -Urine, Clean Catch Negative. [CF]  1423 On repeat evaluation, patient is feeling much better after GI cocktail and viscous Xylocaine .  His symptoms have completely resolved.  I went over all labs with him at the bedside.  Again have a low suspicion for pancreatitis at this time.  Will plan to prescribe him some Xylocaine  at home. [CF]    Clinical Course User Index [CF] Theotis Cameron HERO, PA-C    Medical Decision Making CONARD ALVIRA is a 53 y.o. male patient who presents to the emergency  department today for further evaluation of epigastric abdominal pain.  Again I have a low suspicion for pancreatitis at this time.  Patient does suffer from reflux and takes PPI daily.  This seems like reflux to me.  Low suspicion for ACS.  Patient had normal EKG in the primary care office today.  Will plan to give him GI cocktail.  Labs were performed in triage interpreted by myself.  Lipase was normal.  No other significant abnormalities.  Patient feeling much better after GI cocktail.  Will plan to prescribe him Xylocaine  to take at home.  He will get Mylanta over-the-counter.  Strict turn precautions were discussed.  He is safer discharge.   Amount and/or Complexity of Data Reviewed Labs: ordered. Decision-making details documented in ED Course.  Risk OTC drugs. Prescription drug management.     Final diagnoses:  Epigastric abdominal pain    ED Discharge Orders          Ordered    lidocaine  (XYLOCAINE ) 2 % solution  As needed        01/31/24 1428               Theotis Cameron [place], [place] 01/31/24 1429    Neysa Caron PARAS, DO 01/31/24 1502

## 2024-02-07 ENCOUNTER — Encounter: Payer: Self-pay | Admitting: Podiatry

## 2024-02-07 ENCOUNTER — Ambulatory Visit (INDEPENDENT_AMBULATORY_CARE_PROVIDER_SITE_OTHER): Admitting: Podiatry

## 2024-02-07 DIAGNOSIS — M7661 Achilles tendinitis, right leg: Secondary | ICD-10-CM | POA: Diagnosis not present

## 2024-02-07 DIAGNOSIS — M7662 Achilles tendinitis, left leg: Secondary | ICD-10-CM

## 2024-02-07 MED ORDER — DEXAMETHASONE SODIUM PHOSPHATE 120 MG/30ML IJ SOLN
4.0000 mg | Freq: Once | INTRAMUSCULAR | Status: AC
  Administered 2024-02-07: 4 mg via INTRA_ARTICULAR

## 2024-02-07 NOTE — Progress Notes (Signed)
 He presents today chief complaint of posterior inferior tendo Achilles pain.  He has had this before multiple times and is just flared up again.  Objective: Vital signs are stable alert oriented x 3.  Insertional Achilles tendinitis with bursitis bilateral.  Assessment: Achilles tendinitis insertional bilateral.  Plan: Injected dexamethasone  local anesthetic to the point of maximal tenderness today making sure only to inject 2 mg and not into the tendon itself.  Will follow-up with him as needed.  Remember to ask how his dad is doing from his multiple TIAs.

## 2024-03-02 ENCOUNTER — Other Ambulatory Visit: Payer: Self-pay

## 2024-03-02 ENCOUNTER — Encounter (HOSPITAL_BASED_OUTPATIENT_CLINIC_OR_DEPARTMENT_OTHER): Payer: Self-pay

## 2024-03-02 ENCOUNTER — Emergency Department (HOSPITAL_BASED_OUTPATIENT_CLINIC_OR_DEPARTMENT_OTHER)
Admission: EM | Admit: 2024-03-02 | Discharge: 2024-03-03 | Disposition: A | Discharge status: Home / Self Care | Attending: Emergency Medicine | Admitting: Emergency Medicine

## 2024-03-02 DIAGNOSIS — I10 Essential (primary) hypertension: Secondary | ICD-10-CM | POA: Insufficient documentation

## 2024-03-02 DIAGNOSIS — L02211 Cutaneous abscess of abdominal wall: Secondary | ICD-10-CM | POA: Insufficient documentation

## 2024-03-02 DIAGNOSIS — Z7982 Long term (current) use of aspirin: Secondary | ICD-10-CM | POA: Insufficient documentation

## 2024-03-02 DIAGNOSIS — R739 Hyperglycemia, unspecified: Secondary | ICD-10-CM | POA: Insufficient documentation

## 2024-03-02 LAB — COMPREHENSIVE METABOLIC PANEL WITH GFR
ALT: 17 U/L (ref 0–44)
AST: 21 U/L (ref 15–41)
Albumin: 3.9 g/dL (ref 3.5–5.0)
Alkaline Phosphatase: 70 U/L (ref 38–126)
Anion gap: 13 (ref 5–15)
BUN: 13 mg/dL (ref 6–20)
CO2: 25 mmol/L (ref 22–32)
Calcium: 9.4 mg/dL (ref 8.9–10.3)
Chloride: 103 mmol/L (ref 98–111)
Creatinine, Ser: 1.14 mg/dL (ref 0.61–1.24)
GFR, Estimated: >60 mL/min (ref >60)
Glucose, Bld: 105 mg/dL — ABNORMAL HIGH (ref 70–99)
Potassium: 3.9 mmol/L (ref 3.5–5.1)
Sodium: 141 mmol/L (ref 135–145)
Total Bilirubin: 0.3 mg/dL (ref 0.0–1.2)
Total Protein: 6.2 g/dL — ABNORMAL LOW (ref 6.5–8.1)

## 2024-03-02 LAB — CBC WITH DIFFERENTIAL/PLATELET
Abs Immature Granulocytes: 0.05 K/uL (ref 0.00–0.07)
Basophils Absolute: 0.1 K/uL (ref 0.0–0.1)
Basophils Relative: 2 %
Eosinophils Absolute: 0.2 K/uL (ref 0.0–0.5)
Eosinophils Relative: 2 %
HCT: 40.8 % (ref 39.0–52.0)
Hemoglobin: 13.9 g/dL (ref 13.0–17.0)
Immature Granulocytes: 1 %
Lymphocytes Relative: 45 %
Lymphs Abs: 4.1 K/uL — ABNORMAL HIGH (ref 0.7–4.0)
MCH: 32.1 pg (ref 26.0–34.0)
MCHC: 34.1 g/dL (ref 30.0–36.0)
MCV: 94.2 fL (ref 80.0–100.0)
Monocytes Absolute: 0.7 K/uL (ref 0.1–1.0)
Monocytes Relative: 8 %
Neutro Abs: 3.7 K/uL (ref 1.7–7.7)
Neutrophils Relative %: 42 %
Platelets: 260 K/uL (ref 150–400)
RBC: 4.33 MIL/uL (ref 4.22–5.81)
RDW: 14.4 % (ref 11.5–15.5)
WBC: 8.7 K/uL (ref 4.0–10.5)
nRBC: 0 % (ref 0.0–0.2)

## 2024-03-02 NOTE — ED Provider Notes (Signed)
 [place] EMERGENCY DEPARTMENT AT [facility] Provider Note   CSN: [id number] Arrival date & time: 03/02/24  2102     Patient presents with: Abscess   Justin [name] is a 53 y.o. male.   The history is provided by the patient.  Abscess  He has history of hypertension and comes in because of ongoing drainage from abscess of the abdominal wall.  He noted onset about 1 week ago of redness and swelling in the left lower quadrant and went to urgent care where abscess was incised and drained and he was discharged with a prescription for cephalexin .  Culture was sent and he was given a second prescription for metronidazole when culture results came back.  He did go back for recheck and it was doing well but he was told to come in if it started to get hard or continued to have drainage.  He states that his wife thought that the site was starting to get hard again and he continues to have a small amount of yellowish drainage.  He denies fever chills or sweats.    Prior to Admission medications   Medication Sig Start Date End Date Taking? Authorizing Provider  albuterol  (VENTOLIN  HFA) 108 (90 Base) MCG/ACT inhaler Inhale 2 puffs into the lungs every 6 (six) hours as needed for wheezing or shortness of breath.    [provider]  allopurinol  (ZYLOPRIM ) 300 MG tablet Take 1 tablet (300 mg total) by mouth daily. 05/29/18   Tysinger, Alm RAMAN, PA-C  Ascorbic Acid (VITAMIN C) 1000 MG tablet Take 1,000 mg by mouth every evening.    [provider]  aspirin  81 MG chewable tablet Chew 81 mg by mouth daily. 10/12/20   [provider]  buPROPion (WELLBUTRIN XL) 300 MG 24 hr tablet Take 300 mg by mouth daily.    [provider]  busPIRone  (BUSPAR ) 15 MG tablet TAKE 1 TABLET BY MOUTH TWICE DAILY 05/03/18   Tysinger, Alm RAMAN, PA-C  busPIRone  (BUSPAR ) 30 MG tablet Take 30 mg by mouth 2 (two) times daily.    [provider]  Calcium  Citrate 250 MG TABS Take 250  mg by mouth daily. 04/07/20   [provider]  carvedilol  (COREG ) 3.125 MG tablet TAKE 1 TABLET BY MOUTH TWICE DAILY WITH A MEAL 04/15/18   Tysinger, Alm RAMAN, PA-C  cetirizine (ZYRTEC) 10 MG tablet Take 10 mg by mouth daily.    [provider]  cholecalciferol (VITAMIN D ) 1000 units tablet Take 1,000 Units by mouth daily.    [provider]  diphenhydrAMINE (BENADRYL) 25 MG tablet Take 25 mg by mouth at bedtime as needed for itching. 10/03/19   [provider]  divalproex (DEPAKOTE ER) 500 MG 24 hr tablet Take 500 mg by mouth in the morning, at noon, and at bedtime. 01/26/22   [provider]  escitalopram (LEXAPRO) 20 MG tablet Take 20 mg by mouth daily.    [provider]  Ferrous Sulfate (IRON PO) Take 1 capsule by mouth daily. 04/07/20   [provider]  furosemide  (LASIX ) 20 MG tablet Take 20 mg by mouth daily as needed for fluid. 07/21/22   [provider]  HYDROcodone -acetaminophen  (NORCO/VICODIN) 5-325 MG tablet Take 1-2 tablets by mouth every 6 (six) hours as needed for moderate pain. For postop pain 03/12/23   Rondall Agent, PA-C  isosorbide mononitrate (IMDUR) 30 MG 24 hr tablet Take 30 mg by mouth daily. 12/27/21 12/18/23  [provider]  lidocaine  (  XYLOCAINE ) 2 % solution Use as directed 15 mLs in the mouth or throat as needed for mouth pain. 01/31/24   Theotis Peers M, PA-C  lisdexamfetamine (VYVANSE) 60 MG capsule Take 60 mg by mouth daily.    [provider]  lisinopril -hydrochlorothiazide  (ZESTORETIC ) 10-12.5 MG tablet Take 1 tablet by mouth daily. 07/21/22   [provider]  meclizine (ANTIVERT) 25 MG tablet Take 25 mg by mouth 3 (three) times daily as needed for dizziness.    [provider]  methocarbamol (ROBAXIN) 750 MG tablet Take 750 mg by mouth 3 (three) times daily. 03/01/18   [provider]  naloxone [facility]) nasal spray 4 mg/0.1 mL Place 1 spray into the nose.  11/06/23   [provider]  nitroGLYCERIN  (NITROSTAT ) 0.4 MG SL tablet Place 0.4 mg under the tongue every 5 (five) minutes as needed for chest pain.    [provider]  ondansetron  (ZOFRAN ) 8 MG tablet Take 8 mg by mouth every 8 (eight) hours as needed for nausea or vomiting.    [provider]  pantoprazole  (PROTONIX ) 20 MG tablet Take 20 mg by mouth daily.    [provider]  pravastatin  (PRAVACHOL ) 40 MG tablet TAKE 1 TABLET BY MOUTH EVERY EVENING 01/16/18   Tysinger, Alm RAMAN, PA-C  pregabalin (LYRICA) 100 MG capsule Take 100 mg by mouth 3 (three) times daily.    [provider]  Semaglutide -Weight Management (WEGOVY ) 2.4 MG/0.75ML SOAJ Inject 2.4 mg into the skin once a week. 06/26/23     Semaglutide -Weight Management 0.25 MG/0.5ML SOAJ Inject 0.25 mg into the skin once a week. 01/19/23   [name], PA-C  traMADol  (ULTRAM -ER) 100 MG 24 hr tablet Take 100 mg by mouth in the morning. 02/04/23   [provider]  UBRELVY 100 MG TABS Take 100 mg by mouth as needed (migraine). 02/04/23   [provider]  ZEPBOUND 5 MG/0.5ML Pen Inject into the skin. 09/07/23   [provider]  Zinc 50 MG TABS Take 50 mg by mouth every morning.    [provider]    Allergies: Lamictal [lamotrigine], Tegretol [carbamazepine], Sulfonamide derivatives, Influenza vaccines, and Nsaids    Review of Systems  All other systems reviewed and are negative.   Updated Vital Signs BP 117/84   Pulse 77   Temp 98.3 F (36.8 C) (Oral)   Resp 16   SpO2 93%   Physical Exam Vitals and nursing note reviewed.   53 year old male, resting comfortably and in no acute distress. Vital signs are normal. Oxygen saturation is 93%, which is normal. Head is normocephalic and atraumatic. PERRLA, EOMI.  Lungs are clear without rales, wheezes, or rhonchi. Heart has regular rate and rhythm without murmur. Abdomen is obese, soft, nontender.  Incision and  drainage site is present in the pannus over the left lower quadrant and there is minimal drainage and no erythema and no definite induration. Extremities have 1+ edema, full range of motion is present. Skin is warm and dry without rash. Neurologic: Mental status is normal, moves all extremities equally.  (all labs ordered are listed, but only abnormal results are displayed) Labs Reviewed  COMPREHENSIVE METABOLIC PANEL WITH GFR - Abnormal; Notable for the following components:      Result Value   Glucose, Bld 105 (*)    Total Protein 6.2 (*)    All other components within normal limits  CBC WITH DIFFERENTIAL/PLATELET - Abnormal; Notable for the following components:   Lymphs Abs  4.1 (*)    All other components within normal limits    Radiology: CT PELVIS W CONTRAST Result Date: 03/03/2024 CLINICAL DATA:  Follow-up left abdominal wall abscess EXAM: CT PELVIS WITH CONTRAST TECHNIQUE: Multidetector CT imaging of the pelvis was performed using the standard protocol following the bolus administration of intravenous contrast. RADIATION DOSE REDUCTION: This exam was performed according to the departmental dose-optimization program which includes automated exposure control, adjustment of the mA and/or kV according to patient size and/or use of iterative reconstruction technique. CONTRAST:  OMNIPAQUE  IOHEXOL  350 MG/ML SOLN COMPARISON:  None Available. FINDINGS: Urinary Tract: Bladder is well distended. The kidneys are partially visualized. Nonobstructing right renal stone is noted measuring up to 3 mm. Bowel: Visualized bowel shows no obstructive change. No constipation is noted. Vascular/Lymphatic: No pathologically enlarged lymph nodes. No significant vascular abnormality seen. Reproductive:  Prostate is within normal limits. Other:  No hernia or free fluid is noted. Musculoskeletal: Bony structures are within normal limits. A marker was placed in the area of prior incision and drainage. Some very  minimal skin thickening is noted. No subcutaneous fluid collection is identified. No definitive abscess is seen. IMPRESSION: Area of previous incision and drainage is identified. Mild skin thickening and edema is noted although no focal drainable fluid collection is noted. Nonobstructing right renal stone. Electronically Signed   By: Oneil Devonshire M.D.   On: 03/03/2024 02:17     Procedures   Medications Ordered in the ED  iohexol  (OMNIPAQUE ) 350 MG/ML injection 100 mL (100 mLs Intravenous Contrast Given 03/03/24 0122)                                    Medical Decision Making Amount and/or Complexity of Data Reviewed Labs: ordered. Radiology: ordered.  Risk Prescription drug management.   Abscess of the abdominal wall which seems to be healing appropriately.  However, he is concerned about ongoing drainage and induration and was instructed to come in for CT scan if those occurred.  I have reviewed his laboratory tests, and my interpretation is normal CBC, minimally elevated random glucose level which will need to be followed as an outpatient.  I have ordered CT of pelvis.  I have reviewed his past records and note urgent care visit on 02/25/2024 for incision and drainage of abscess of abdominal wall, follow-up visit on 02/27/2024 which showed appropriate healing.  Wound culture grew scant amount of Prevotella bivia which is why he was given a prescription for metronidazole.  CT scan shows no abscess, but some skin thickening and edema is noted at site of previous I&D.  At this point, I am encouraging him to continue local care and continue his antibiotics until they are completed.  Return if symptoms are worsening.     Final diagnoses:  Abscess of abdominal wall  Elevated random blood glucose level    ED Discharge Orders     None          Raford Lenis, MD 03/03/24 [phone]

## 2024-03-02 NOTE — ED Triage Notes (Signed)
 Pt c/o abscess in groin area first noticed last Sunday night. States he was seen at [facility] Monday for same, I&D, packed, re-eval Wednesday, packing removed. Pt advises that he was told if drainage continued, seek re-eval. Denies known fever, states abscess is still draining, still hard, & still hurts.   Keflex  since Monday, Flagyl since Wednesday

## 2024-03-02 NOTE — ED Provider Notes (Incomplete)
 Justin [name] AT [facility] Provider Note   CSN: [id number] Arrival date & time: 03/02/24  2102     Patient presents with: Abscess   Justin [name] is a 53 y.o. male.  {Add pertinent medical, surgical, social history, OB history to YEP:67052} The history is provided by the patient.  Abscess  He has history of hypertension    Prior to Admission medications   Medication Sig Start Date End Date Taking? Authorizing Provider  albuterol  (VENTOLIN  HFA) 108 (90 Base) MCG/ACT inhaler Inhale 2 puffs into the lungs every 6 (six) hours as needed for wheezing or shortness of breath.    [provider]  allopurinol  (ZYLOPRIM ) 300 MG tablet Take 1 tablet (300 mg total) by mouth daily. 05/29/18   Tysinger, Alm RAMAN, PA-C  Ascorbic Acid (VITAMIN C) 1000 MG tablet Take 1,000 mg by mouth every evening.    [provider]  aspirin  81 MG chewable tablet Chew 81 mg by mouth daily. 10/12/20   [provider]  buPROPion (WELLBUTRIN XL) 300 MG 24 hr tablet Take 300 mg by mouth daily.    [provider]  busPIRone  (BUSPAR ) 15 MG tablet TAKE 1 TABLET BY MOUTH TWICE DAILY 05/03/18   Tysinger, Alm RAMAN, PA-C  busPIRone  (BUSPAR ) 30 MG tablet Take 30 mg by mouth 2 (two) times daily.    [provider]  Calcium  Citrate 250 MG TABS Take 250 mg by mouth daily. 04/07/20   [provider]  carvedilol  (COREG ) 3.125 MG tablet TAKE 1 TABLET BY MOUTH TWICE DAILY WITH A MEAL 04/15/18   Tysinger, Alm RAMAN, PA-C  cetirizine (ZYRTEC) 10 MG tablet Take 10 mg by mouth daily.    [provider]  cholecalciferol (VITAMIN D ) 1000 units tablet Take 1,000 Units by mouth daily.    [provider]  diphenhydrAMINE (BENADRYL) 25 MG tablet Take 25 mg by mouth at bedtime as needed for itching. 10/03/19   [provider]  divalproex (DEPAKOTE ER) 500 MG 24 hr tablet Take 500 mg by mouth in the morning, at noon, and at bedtime. 01/26/22    [provider]  escitalopram (LEXAPRO) 20 MG tablet Take 20 mg by mouth daily.    [provider]  Ferrous Sulfate (IRON PO) Take 1 capsule by mouth daily. 04/07/20   [provider]  furosemide  (LASIX ) 20 MG tablet Take 20 mg by mouth daily as needed for fluid. 07/21/22   [provider]  HYDROcodone -acetaminophen  (NORCO/VICODIN) 5-325 MG tablet Take 1-2 tablets by mouth every 6 (six) hours as needed for moderate pain. For postop pain 03/12/23   Rondall Agent, PA-C  isosorbide mononitrate (IMDUR) 30 MG 24 hr tablet Take 30 mg by mouth daily. 12/27/21 12/18/23  [provider]  lidocaine  (XYLOCAINE ) 2 % solution Use as directed 15 mLs in the mouth or throat as needed for mouth pain. 01/31/24   Theotis Peers M, PA-C  lisdexamfetamine (VYVANSE) 60 MG capsule Take 60 mg by mouth daily.    [provider]  lisinopril -hydrochlorothiazide  (ZESTORETIC ) 10-12.5 MG tablet Take 1 tablet by mouth daily. 07/21/22   [provider]  meclizine (ANTIVERT) 25 MG tablet Take 25 mg by mouth 3 (three) times daily as needed for dizziness.    [provider]  methocarbamol (ROBAXIN) 750 MG tablet Take 750 mg by mouth 3 (three) times daily. 03/01/18   [provider]  naloxone [facility]) nasal spray 4 mg/0.1 mL Place 1 spray into the nose. 11/06/23  [provider]  nitroGLYCERIN  (NITROSTAT ) 0.4 MG SL tablet Place 0.4 mg under the tongue every 5 (five) minutes as needed for chest pain.    [provider]  ondansetron  (ZOFRAN ) 8 MG tablet Take 8 mg by mouth every 8 (eight) hours as needed for nausea or vomiting.    [provider]  pantoprazole  (PROTONIX ) 20 MG tablet Take 20 mg by mouth daily.    [provider]  pravastatin  (PRAVACHOL ) 40 MG tablet TAKE 1 TABLET BY MOUTH EVERY EVENING 01/16/18   Tysinger, Alm RAMAN, PA-C  pregabalin (LYRICA) 100 MG capsule Take 100 mg by mouth 3 (three) times daily.    [provider]  Semaglutide -Weight Management (WEGOVY ) 2.4 MG/0.75ML SOAJ Inject 2.4 mg into the skin once a week. 06/26/23     Semaglutide -Weight Management 0.25 MG/0.5ML SOAJ Inject 0.25 mg into the skin once a week. 01/19/23   [name], PA-C  traMADol  (ULTRAM -ER) 100 MG 24 hr tablet Take 100 mg by mouth in the morning. 02/04/23   [provider]  UBRELVY 100 MG TABS Take 100 mg by mouth as needed (migraine). 02/04/23   [provider]  ZEPBOUND 5 MG/0.5ML Pen Inject into the skin. 09/07/23   [provider]  Zinc 50 MG TABS Take 50 mg by mouth every morning.    [provider]    Allergies: Lamictal [lamotrigine], Tegretol [carbamazepine], Sulfonamide derivatives, Influenza vaccines, and Nsaids    Review of Systems  All other systems reviewed and are negative.   Updated Vital Signs BP 117/84   Pulse 77   Temp 98.3 F (36.8 C) (Oral)   Resp 16   SpO2 93%   Physical Exam Vitals and nursing note reviewed.   53 year old male, resting comfortably and in no acute distress. Vital signs are ***. Oxygen saturation is ***%, which is normal. Head is normocephalic and atraumatic. PERRLA, EOMI. Oropharynx is clear. Neck is nontender and supple without adenopathy or JVD. Back is nontender and there is no CVA tenderness. Lungs are clear without rales, wheezes, or rhonchi. Chest is nontender. Heart has regular rate and rhythm without murmur. Abdomen is soft, flat, nontender without masses or hepatosplenomegaly and peristalsis is normoactive. Extremities have no cyanosis or edema, full range of motion is present. Skin is warm and dry without rash. Neurologic: Mental status is normal, cranial nerves are intact, there are no motor or sensory deficits.  (all labs ordered are listed, but only abnormal results are displayed) Labs Reviewed  COMPREHENSIVE METABOLIC PANEL WITH GFR - Abnormal; Notable for the following components:      Result Value   Glucose,  Bld 105 (*)    Total Protein 6.2 (*)    All other components within normal limits  CBC WITH DIFFERENTIAL/PLATELET - Abnormal; Notable for the following components:   Lymphs Abs 4.1 (*)    All other components within normal limits    EKG: None  Radiology: No results found.  {Document cardiac monitor, telemetry assessment procedure when appropriate:32947} Procedures   Medications Ordered in the ED - No data to display    {Click here for ABCD2, HEART and other calculators REFRESH Note before signing:1}                              Medical Decision Making Amount and/or Complexity of Data Reviewed Labs: ordered.   ***  {Document critical care time when appropriate  Document review of  labs and clinical decision tools ie CHADS2VASC2, etc  Document your independent review of radiology images and any outside records  Document your discussion with family members, caretakers and with consultants  Document social determinants of health affecting pt's care  Document your decision making why or why not admission, treatments were needed:32947:::1}   Final diagnoses:  None    ED Discharge Orders     None

## 2024-03-03 ENCOUNTER — Emergency Department (HOSPITAL_BASED_OUTPATIENT_CLINIC_OR_DEPARTMENT_OTHER)

## 2024-03-03 MED ORDER — IOHEXOL 350 MG/ML SOLN
100.0000 mL | Freq: Once | INTRAVENOUS | Status: AC | PRN
  Administered 2024-03-03: 100 mL via INTRAVENOUS

## 2024-03-03 NOTE — Discharge Instructions (Addendum)
 Your CT scan does not show any residual abscess.  However, it may continue to drain for weeks or even months.  As long as it is draining, it is unlikely that a new abscess will be forming.    Continue taking the antibiotics you were prescribed until they are completely gone.  If at any point, it starts getting more painful and redder and harder, you are welcome to return for reevaluation.

## 2024-05-22 ENCOUNTER — Encounter: Payer: Self-pay | Admitting: Podiatry

## 2024-05-22 ENCOUNTER — Ambulatory Visit: Admitting: Podiatry

## 2024-05-22 DIAGNOSIS — L600 Ingrowing nail: Secondary | ICD-10-CM

## 2024-05-22 DIAGNOSIS — M7661 Achilles tendinitis, right leg: Secondary | ICD-10-CM

## 2024-05-22 DIAGNOSIS — M7662 Achilles tendinitis, left leg: Secondary | ICD-10-CM

## 2024-05-22 MED ORDER — DEXAMETHASONE SODIUM PHOSPHATE 120 MG/30ML IJ SOLN: 2.0000 mg | Freq: Once | INTRAMUSCULAR | Status: AC

## 2024-05-22 MED ORDER — NEOMYCIN-POLYMYXIN-HC 1 % OT SOLN: OTIC | 1 refills | Status: AC

## 2024-05-22 NOTE — Progress Notes (Signed)
 Presents today chief complaint of painful Achilles tendinitis left.  But worse is his ingrown toenail to his left hallux tibial and fibular border.  Objective: Vital signs are stable alert oriented x 3.  Pulses are palpable.  The majority of his pain on the posterior medial aspect of the Achilles near its insertion site inferiorly.  He also has pain on palpation with a sharp incurvated nail margin tibia-fibula border of the hallux left.  There is mild erythema no cellulitis drainage or odor no purulence.  Assessment: Painful insertional Achilles tendinitis medially and ingrown toenail tibial-fibular border of the hallux left.  Plan: Discussed etiology pathology conservative versus surgical therapies.  I injected the insertion area of the Achilles tendon making sure not to inject directly into the tendon with dexamethasone  and local anesthetic a total of 2 mg was injected.  Chemical matricectomy was performed after local anesthetic and verbal and written consent was obtained.  The nail was split from distal to proximal along the tibial-fibular border avulsed exposing the matrix which was then analyzed 3 applications of phenol 30 seconds each neutralized with isopropyl alcohol Silvadene cream and sterile compressive dressing was applied.  Both oral and written referral provided as well as a prescription for Cortisporin Otic to be applied twice daily after soaking.  Will follow-up with him in 2 to 3 weeks.

## 2024-05-22 NOTE — Patient Instructions (Signed)

## 2024-06-02 ENCOUNTER — Other Ambulatory Visit: Payer: Self-pay | Admitting: Podiatry

## 2024-06-02 ENCOUNTER — Encounter: Payer: Self-pay | Admitting: Podiatry

## 2024-06-02 MED ORDER — CLINDAMYCIN HCL 150 MG PO CAPS: 150.0000 mg | ORAL_CAPSULE | Freq: Three times a day (TID) | ORAL | 0 refills | Status: AC

## 2024-06-05 ENCOUNTER — Ambulatory Visit: Admitting: Podiatry

## 2024-06-17 ENCOUNTER — Ambulatory Visit (INDEPENDENT_AMBULATORY_CARE_PROVIDER_SITE_OTHER): Admitting: Podiatry

## 2024-06-17 ENCOUNTER — Encounter: Payer: Self-pay | Admitting: Podiatry

## 2024-06-17 DIAGNOSIS — L600 Ingrowing nail: Secondary | ICD-10-CM

## 2024-06-17 DIAGNOSIS — Z9889 Other specified postprocedural states: Secondary | ICD-10-CM

## 2024-06-17 NOTE — Progress Notes (Signed)
 He presents today being a diabetic he concerned about his nail which we performed a matrixectomy on hallux left.  He says he got infected and they sent me an antibiotic and it still looks a little weird to me but is doing better.  Objective: Vital signs stable alert oriented x 3 there is mild erythema along the proximal nail fold medially and laterally at the points where the matrixectomy was performed.  No purulence no malodor the toe is nontender to palpation.  Assessment: Well-healing surgical toe appears that the paronychia is under good control.  Plan: I recommended that he continue to soak every other day Epsom salts and warm water covered in the day but leave open to bedtime.  If it is not draining then he does not have to cover it.
